# Patient Record
Sex: Female | Born: 1985 | Race: White | Hispanic: No | Marital: Married | Smoking: Never smoker
Health system: Southern US, Community
[De-identification: ages and names within clinical notes are randomized; demographics above are authoritative.]

## PROBLEM LIST (undated history)

## (undated) ENCOUNTER — Inpatient Hospital Stay (HOSPITAL_COMMUNITY): Payer: Self-pay

## (undated) ENCOUNTER — Emergency Department (HOSPITAL_COMMUNITY): Disposition: A | Discharge status: Home / Self Care | Payer: BC Managed Care – PPO

## (undated) DIAGNOSIS — F32A Depression, unspecified: Secondary | ICD-10-CM

## (undated) DIAGNOSIS — U071 COVID-19: Secondary | ICD-10-CM

## (undated) DIAGNOSIS — K219 Gastro-esophageal reflux disease without esophagitis: Secondary | ICD-10-CM

## (undated) DIAGNOSIS — J45909 Unspecified asthma, uncomplicated: Secondary | ICD-10-CM

## (undated) DIAGNOSIS — K5010 Crohn's disease of large intestine without complications: Secondary | ICD-10-CM

## (undated) DIAGNOSIS — Z5189 Encounter for other specified aftercare: Secondary | ICD-10-CM

## (undated) DIAGNOSIS — D649 Anemia, unspecified: Secondary | ICD-10-CM

## (undated) DIAGNOSIS — T7840XA Allergy, unspecified, initial encounter: Secondary | ICD-10-CM

## (undated) DIAGNOSIS — F419 Anxiety disorder, unspecified: Secondary | ICD-10-CM

## (undated) DIAGNOSIS — R51 Headache: Secondary | ICD-10-CM

## (undated) DIAGNOSIS — F329 Major depressive disorder, single episode, unspecified: Secondary | ICD-10-CM

## (undated) HISTORY — DX: Allergy, unspecified, initial encounter: T78.40XA

## (undated) HISTORY — PX: FRACTURE SURGERY: SHX138

## (undated) HISTORY — PX: SPINE SURGERY: SHX786

## (undated) HISTORY — DX: Encounter for other specified aftercare: Z51.89

## (undated) HISTORY — DX: Unspecified asthma, uncomplicated: J45.909

## (undated) HISTORY — DX: Anxiety disorder, unspecified: F41.9

## (undated) HISTORY — DX: Anemia, unspecified: D64.9

## (undated) HISTORY — PX: WISDOM TOOTH EXTRACTION: SHX21

## (undated) HISTORY — PX: LAPAROSCOPIC ENDOMETRIOSIS FULGURATION: SUR769

## (undated) HISTORY — PX: CERVICAL SPINE SURGERY: SHX589

---
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
## 2007-03-04 ENCOUNTER — Ambulatory Visit: Payer: Self-pay | Admitting: Gastroenterology

## 2007-03-29 ENCOUNTER — Ambulatory Visit: Payer: Self-pay | Admitting: Gastroenterology

## 2007-03-29 ENCOUNTER — Encounter: Payer: Self-pay | Admitting: Gastroenterology

## 2007-04-20 ENCOUNTER — Ambulatory Visit: Payer: Self-pay | Admitting: Gastroenterology

## 2008-02-02 ENCOUNTER — Ambulatory Visit (HOSPITAL_COMMUNITY): Admission: RE | Admit: 2008-02-02 | Discharge: 2008-02-02 | Payer: Self-pay | Admitting: Family Medicine

## 2008-02-02 ENCOUNTER — Encounter (INDEPENDENT_AMBULATORY_CARE_PROVIDER_SITE_OTHER): Payer: Self-pay | Admitting: *Deleted

## 2008-02-10 ENCOUNTER — Other Ambulatory Visit: Admission: RE | Admit: 2008-02-10 | Discharge: 2008-02-10 | Payer: Self-pay | Admitting: Obstetrics and Gynecology

## 2008-10-19 ENCOUNTER — Ambulatory Visit: Payer: Self-pay | Admitting: Internal Medicine

## 2008-10-19 ENCOUNTER — Telehealth: Payer: Self-pay | Admitting: Gastroenterology

## 2008-10-19 ENCOUNTER — Telehealth (INDEPENDENT_AMBULATORY_CARE_PROVIDER_SITE_OTHER): Payer: Self-pay | Admitting: *Deleted

## 2008-10-19 ENCOUNTER — Ambulatory Visit: Payer: Self-pay | Admitting: Physician Assistant

## 2008-10-19 LAB — CONVERTED CEMR LAB
Basophils Absolute: 0 10*3/uL (ref 0.0–0.1)
Basophils Relative: 0.9 % (ref 0.0–3.0)
Bilirubin Urine: NEGATIVE
Eosinophils Absolute: 0.2 10*3/uL (ref 0.0–0.7)
Eosinophils Relative: 3.7 % (ref 0.0–5.0)
HCT: 39.5 % (ref 36.0–46.0)
Hemoglobin: 13.5 g/dL (ref 12.0–15.0)
Ketones, ur: NEGATIVE mg/dL
Lymphocytes Relative: 25.3 % (ref 12.0–46.0)
Lymphs Abs: 1.3 10*3/uL (ref 0.7–4.0)
MCHC: 34.2 g/dL (ref 30.0–36.0)
MCV: 87 fL (ref 78.0–100.0)
Monocytes Absolute: 0.4 10*3/uL (ref 0.1–1.0)
Monocytes Relative: 6.7 % (ref 3.0–12.0)
Neutro Abs: 3.4 10*3/uL (ref 1.4–7.7)
Neutrophils Relative %: 63.4 % (ref 43.0–77.0)
Nitrite: NEGATIVE
Platelets: 276 10*3/uL (ref 150.0–400.0)
RBC: 4.54 M/uL (ref 3.87–5.11)
RDW: 13.1 % (ref 11.5–14.6)
Specific Gravity, Urine: 1.01 (ref 1.000–1.030)
Total Protein, Urine: NEGATIVE mg/dL
Urine Glucose: NEGATIVE mg/dL
Urobilinogen, UA: 0.2 (ref 0.0–1.0)
WBC: 5.3 10*3/uL (ref 4.5–10.5)
hCG, Beta Chain, Quant, S: <0.5 milliintl units/mL
pH: 7 (ref 5.0–8.0)

## 2008-10-20 ENCOUNTER — Encounter: Payer: Self-pay | Admitting: Physician Assistant

## 2009-05-22 ENCOUNTER — Telehealth: Payer: Self-pay | Admitting: Gastroenterology

## 2009-05-23 ENCOUNTER — Ambulatory Visit: Payer: Self-pay | Admitting: Gastroenterology

## 2009-05-23 ENCOUNTER — Encounter (INDEPENDENT_AMBULATORY_CARE_PROVIDER_SITE_OTHER): Payer: Self-pay | Admitting: *Deleted

## 2009-05-23 DIAGNOSIS — K219 Gastro-esophageal reflux disease without esophagitis: Secondary | ICD-10-CM | POA: Insufficient documentation

## 2009-05-24 ENCOUNTER — Ambulatory Visit: Payer: Self-pay | Admitting: Gastroenterology

## 2009-06-18 ENCOUNTER — Ambulatory Visit: Payer: Self-pay | Admitting: Gastroenterology

## 2009-06-18 ENCOUNTER — Telehealth: Payer: Self-pay | Admitting: Gastroenterology

## 2009-06-18 DIAGNOSIS — F419 Anxiety disorder, unspecified: Secondary | ICD-10-CM | POA: Insufficient documentation

## 2009-06-28 ENCOUNTER — Encounter: Payer: Self-pay | Admitting: Gastroenterology

## 2009-07-06 ENCOUNTER — Telehealth (INDEPENDENT_AMBULATORY_CARE_PROVIDER_SITE_OTHER): Payer: Self-pay | Admitting: *Deleted

## 2010-03-08 ENCOUNTER — Telehealth: Payer: Self-pay | Admitting: Gastroenterology

## 2010-05-19 HISTORY — PX: HAND SURGERY: SHX662

## 2010-06-03 ENCOUNTER — Ambulatory Visit
Admission: RE | Admit: 2010-06-03 | Discharge: 2010-06-03 | Payer: Self-pay | Source: Home / Self Care | Attending: Gastroenterology | Admitting: Gastroenterology

## 2010-06-03 ENCOUNTER — Other Ambulatory Visit: Payer: Self-pay | Admitting: Gastroenterology

## 2010-06-03 LAB — BASIC METABOLIC PANEL
BUN: 10 mg/dL (ref 6–23)
CO2: 30 mEq/L (ref 19–32)
Calcium: 9.4 mg/dL (ref 8.4–10.5)
Chloride: 101 mEq/L (ref 96–112)
Creatinine, Ser: 0.6 mg/dL (ref 0.4–1.2)
GFR: 123 mL/min (ref >60.00)
Glucose, Bld: 92 mg/dL (ref 70–99)
Potassium: 4 mEq/L (ref 3.5–5.1)
Sodium: 137 mEq/L (ref 135–145)

## 2010-06-03 LAB — HEPATIC FUNCTION PANEL
ALT: 26 U/L (ref 0–35)
AST: 23 U/L (ref 0–37)
Albumin: 4.2 g/dL (ref 3.5–5.2)
Alkaline Phosphatase: 33 U/L — ABNORMAL LOW (ref 39–117)
Bilirubin, Direct: 0.1 mg/dL (ref 0.0–0.3)
Total Bilirubin: 0.4 mg/dL (ref 0.3–1.2)
Total Protein: 7 g/dL (ref 6.0–8.3)

## 2010-06-03 LAB — CBC WITH DIFFERENTIAL/PLATELET
Basophils Absolute: 0.1 10*3/uL (ref 0.0–0.1)
Basophils Relative: 1.2 % (ref 0.0–3.0)
Eosinophils Absolute: 0.2 10*3/uL (ref 0.0–0.7)
Eosinophils Relative: 2.1 % (ref 0.0–5.0)
HCT: 38.9 % (ref 36.0–46.0)
Hemoglobin: 13.2 g/dL (ref 12.0–15.0)
Lymphocytes Relative: 18.2 % (ref 12.0–46.0)
Lymphs Abs: 1.7 10*3/uL (ref 0.7–4.0)
MCHC: 34 g/dL (ref 30.0–36.0)
MCV: 86.4 fl (ref 78.0–100.0)
Monocytes Absolute: 0.8 10*3/uL (ref 0.1–1.0)
Monocytes Relative: 8 % (ref 3.0–12.0)
Neutro Abs: 6.7 10*3/uL (ref 1.4–7.7)
Neutrophils Relative %: 70.5 % (ref 43.0–77.0)
Platelets: 345 10*3/uL (ref 150.0–400.0)
RBC: 4.5 Mil/uL (ref 3.87–5.11)
RDW: 13 % (ref 11.5–14.6)
WBC: 9.5 10*3/uL (ref 4.5–10.5)

## 2010-06-03 LAB — TSH: TSH: 0.65 u[IU]/mL (ref 0.35–5.50)

## 2010-06-03 LAB — HIGH SENSITIVITY CRP: CRP, High Sensitivity: 6.21 mg/L — ABNORMAL HIGH (ref 0.00–5.00)

## 2010-06-03 LAB — SEDIMENTATION RATE: Sed Rate: 10 mm/hr (ref 0–22)

## 2010-06-03 LAB — VITAMIN B12: Vitamin B-12: 289 pg/mL (ref 211–911)

## 2010-06-09 ENCOUNTER — Encounter: Payer: Self-pay | Admitting: Gastroenterology

## 2010-06-18 NOTE — Medication Information (Signed)
Summary: Dexilant Approved/[employer]  Dexilant Approved/[employer]   Imported By: Sherian Rein 07/05/2009 07:50:59  _____________________________________________________________________  External Attachment:    Type:   Image     Comment:   External Document

## 2010-06-18 NOTE — Progress Notes (Signed)
Summary: Triage   Phone Note Call from Patient Call back at Home Phone [phone]   Caller: Patient Call For: Dr. Russella Dar Reason for Call: Talk to Nurse Summary of Call: Pt. just left the office and her PW said to take Dexilant and Diphenhydramine. She does not know what the medication Diphenhydramine is for. Initial call taken by: Karna Christmas,  June 18, 2009 10:22 AM  Follow-up for Phone Call        Left message for patient to call back Darcey Nora RN, [facility]  June 18, 2009 10:35 AM  questions answered, Patient  didn't know diphenhydramine was Benadryl. Follow-up by: Darcey Nora RN, CGRN,  June 19, 2009 12:10 PM

## 2010-06-18 NOTE — Assessment & Plan Note (Signed)
Summary: dysphagia and chest pain/sheri   History of Present Illness Visit Type: follow up  Primary GI MD: Elie Goody MD [facility] Primary Provider: Tomi Bamberger, NP Requesting Provider: n/a Chief Complaint: Epigastric pain when pt swallows food and chest pain  History of Present Illness:   This is a 25 year old female with Crohn's ileocolitis diagnosed in 2008 who relates a 3-4 week history of acid reflux, regurgitation, pain and difficulty swallowing. She also notes epigastric pain. 2 weeks ago she had a hiccups for an entire day. She feels her Crohn's disease has been under good control with stools that are generally loose and rare episodes of blood. She has not had any abdominal pain or severe diarrhea.   GI Review of Systems    Reports abdominal pain and  chest pain.     Location of  Abdominal pain: epigastric area.    Denies acid reflux, belching, bloating, dysphagia with liquids, dysphagia with solids, heartburn, loss of appetite, nausea, vomiting, vomiting blood, weight loss, and  weight gain.        Denies anal fissure, black tarry stools, change in bowel habit, constipation, diarrhea, diverticulosis, fecal incontinence, heme positive stool, hemorrhoids, irritable bowel syndrome, jaundice, light color stool, liver problems, rectal bleeding, and  rectal pain.   Current Medications (verified): 1)  Pentasa 500 Mg Cr-Caps (Mesalamine) .... Take 2 Capsules Four Times A Day 2)  Birth Control .... As Directed 3)  Klonopin 0.5 Mg Tabs (Clonazepam) .... As Needed 4)  Fluoxetine Hcl 10 Mg Caps (Fluoxetine Hcl) .... Once Daily 5)  Tylenol Extra Strength 500 Mg Tabs (Acetaminophen) .... 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 Unremarkable  Family History: Family  History of Esophageal Cancer:Grandfather Family History of Heart Disease:  No FH of Colon Cancer:  Social History: Reviewed history from 10/19/2008 and no changes required. 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  Alcohol Use - yes Illicit Drug Use - no Patient gets regular exercise.  Review of Systems       The pertinent positives and negatives are noted as above and in the HPI. All other ROS were reviewed and were negative.   Vital Signs:  Patient profile:   25 year old female Height:      62 inches Weight:      141 pounds BMI:     25.88 BSA:     1.65 Pulse rate:   84 / minute Pulse rhythm:   regular BP sitting:   98 / 66  (left arm) Cuff size:   regular  Vitals Entered By: Ok Anis CMA (May 23, 2009 10:26 AM)  Physical Exam  General:  Well developed, well nourished, no acute distress. Head:  Normocephalic and atraumatic. Eyes:  PERRLA, no icterus. Ears:  Normal auditory acuity. Mouth:  No deformity or lesions, dentition normal. Neck:  Supple; no masses or thyromegaly. Lungs:  Clear throughout to auscultation. Heart:  Regular rate and rhythm; no murmurs, rubs,  or bruits. Abdomen:  Soft, nontender and nondistended. No masses, hepatosplenomegaly or hernias noted. Normal bowel sounds. Msk:  Symmetrical with no gross deformities. Normal posture. Pulses:  Normal pulses noted. Extremities:  No clubbing, cyanosis, edema or deformities noted. Neurologic:  Alert and  oriented x4;  grossly normal neurologically. Cervical Nodes:  No significant cervical  adenopathy. Inguinal Nodes:  No significant inguinal adenopathy. Psych:  Alert and cooperative. Normal mood and affect.   Impression & Recommendations:  Problem # 1:  OTHER DYSPHAGIA (ICD-787.29) Dysphagia and odynophagia. Rule out esophagitis, strictures, Crohn's and other disorders. The risks, benefits and alternatives to endoscopy with possible biopsy and possible dilation were discussed  with the patient and they consent to proceed. The procedure will be scheduled electively. Begin Prevacid 30 mg q. day, and standard antireflux measures. Orders: EGD SAV (EGD SAV)  Problem # 2:  GERD (ICD-530.81) As in problem #1. Orders: EGD SAV (EGD SAV)  Problem # 3:  CROHN'S DISEASE-LARGE & SMALL INTESTINE (ICD-555.2) Continue Pentasa 1 g q.i.d. She has not been compliant with recommended followup for Crohn's disease. Recommend followup office visit at least yearly if she is doing well.  Patient Instructions: 1)  Conscious Sedation brochure given.  2)  Upper Endoscopy with Dilatation brochure given.  3)  Start Prevacid 2 tablets by mouth every morning.  4)  Avoid foods high in acid content ( tomatoes, citrus juices, spicy foods) . Avoid eating within 3 to 4 hours of lying down or before exercising. Do not over eat; try smaller more frequent meals. Elevate head of bed four inches when sleeping.  5)  Copy sent to : Tomi Bamberger, NP 6)  The medication list was reviewed and reconciled.  All changed / newly prescribed medications were explained.  A complete medication list was provided to the patient / caregiver.

## 2010-06-18 NOTE — Letter (Signed)
Summary: EGD Instructions  [facility]  [address] [place], [place] [address]   Phone: [phone]  Fax: [phone]       Sonia [name]    08-Aug-1985    MRN: [id number]       Procedure Day /Date: Thursday January 6th, 2011     Arrival Time:  10:00am     Procedure Time: 11:00am     Location of Procedure:                    _ x _ [facility] (4th Floor)    PREPARATION FOR ENDOSCOPY   On 05/24/09  THE DAY OF THE PROCEDURE:  1.   No solid foods, milk or milk products are allowed after midnight the night before your procedure.  2.   Do not drink anything colored red or purple.  Avoid juices with pulp.  No orange juice.  3.  You may drink clear liquids until 9:00am , which is 2 hours before your procedure.                                                                                                CLEAR LIQUIDS INCLUDE: Water Jello Ice Popsicles Tea (sugar ok, no milk/cream) Powdered fruit flavored drinks Coffee (sugar ok, no milk/cream) Gatorade Juice: apple, white grape, white cranberry  Lemonade Clear bullion, consomm, broth Carbonated beverages (any kind) Strained chicken noodle soup Hard Candy   MEDICATION INSTRUCTIONS  Unless otherwise instructed, you should take regular prescription medications with a small sip of water as early as possible the morning of your procedure.           OTHER INSTRUCTIONS  You will need a responsible adult at least 25 years of age to accompany you and drive you home.   This person must remain in the waiting room during your procedure.  Wear loose fitting clothing that is easily removed.  Leave jewelry and other valuables at home.  However, you may wish to bring a book to read or an iPod/MP3 player to listen to music as you wait for your procedure to start.  Remove all body piercing jewelry and leave at home.  Total time from sign-in until discharge is approximately 2-3 hours.  You  should go home directly after your procedure and rest.  You can resume normal activities the day after your procedure.  The day of your procedure you should not:   Drive   Make legal decisions   Operate machinery   Drink alcohol   Return to work  You will receive specific instructions about eating, activities and medications before you leave.    The above instructions have been reviewed and explained to me by   Marchelle Folks.     I fully understand and can verbalize these instructions _____________________________ Date _________

## 2010-06-18 NOTE — Assessment & Plan Note (Signed)
Summary: FOLLOW UP--CH.   History of Present Illness Visit Type: Follow-up Visit Primary GI MD: Joylene Igo MD [facility] Primary Provider: Delia Chimes, NP Requesting Provider: n/a Chief Complaint: Patient here to f/u after EGD. She states that her dysphagia continues to remain the same. She denies any heartburn or other GI symptoms at this time. History of Present Illness:   Sonia [name] returns today complaining of brief epigastric pain with swallowing. She does not experience this with water, but does with other fluids and solids. Symptoms have not changed to use Prevacid. Upper endoscopy with empiric dilation was normal and the dilation does not improve her symptoms. She complains of frequent chest pain in her left lower chest associated with "heart fluttering". The symptoms occur intermittently throughout the day and they're not related to meals. She has frequent episodes of dyspnea at rest particularly at night.   GI Review of Systems    Reports chest pain, dysphagia with liquids, and  dysphagia with solids.      Denies abdominal pain, acid reflux, belching, bloating, heartburn, loss of appetite, nausea, vomiting, vomiting blood, weight loss, and  weight gain.        Denies anal fissure, black tarry stools, change in bowel habit, constipation, diarrhea, diverticulosis, fecal incontinence, heme positive stool, hemorrhoids, irritable bowel syndrome, jaundice, light color stool, liver problems, rectal bleeding, and  rectal pain.   Current Medications (verified): 1)  Pentasa 500 Mg Cr-Caps (Mesalamine) .... Take 2 Capsules By Mouth Once Daily 2)  Klonopin 0.5 Mg Tabs (Clonazepam) .... As Needed 3)  Fluoxetine Hcl 10 Mg Caps (Fluoxetine Hcl) .... Once Daily 4)  Tylenol Extra Strength 500 Mg Tabs (Acetaminophen) .... Take Every 6 Hours As Needed Pain 5)  Prevacid 24hr 15 Mg Cpdr (Lansoprazole) .... 2 Tablets By Mouth Every Morning 6)  Diphenhydramine Hcl 25 Mg Caps (Diphenhydramine  Hcl) .... 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 Unremarkable  Family History: Family History of Esophageal Cancer:Grandfather Family History of Heart Disease: Mother, Maternal Grandmother No FH of Colon Cancer: Family History of Clotting disorder: Mother ?  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  Alcohol Use - yes-occasional Illicit Drug Use - no Patient gets regular exercise.  Review of Systems       The patient complains of back pain, headaches-new, heart rhythm changes, and shortness of breath.         The pertinent positives and negatives are noted as above and in the HPI. All other ROS were reviewed and were negative.   Vital Signs:  Patient profile:   25 year old female Height:      62 inches Weight:      139 pounds BMI:     25.52 BSA:     1.64 Pulse rate:   64 / minute Pulse rhythm:   irregular BP sitting:   100 / 709  (left arm)  Vitals Entered By: Awilda Bill CMA Deborra Medina) (June 18, 2009 9:27 AM)  Physical Exam  General:  Well developed, well nourished, no acute distress. Head:  Normocephalic and atraumatic. Eyes:  PERRLA, no icterus. Mouth:  No deformity or lesions, dentition normal. Chest Wall:  Symmetrical;  no deformities or tenderness. Lungs:  Clear throughout to auscultation. Heart:  Regular rate and rhythm; no  murmurs, rubs,  or bruits. Abdomen:  Soft, nontender and nondistended. No masses, hepatosplenomegaly or hernias noted. Normal bowel sounds. Psych:  Alert and cooperative. Normal mood and affect.  Impression & Recommendations:  Problem # 1:  OTHER DYSPHAGIA (ICD-787.29) Brief epigastric pain, associated with swallowing. Rule out esophageal motility disturbances were  refractory reflux symptoms, although the symptoms are certainly atypical for reflux. Discontinue Prevacid and begin Dexilant 60 mg q.a.m. If her symptoms do not respond proceed with esophageal manometry.  Problem # 2:  CROHN'S DISEASE-LARGE & SMALL INTESTINE (ICD-555.2) Disease stable. Continue Pentasa.  Problem # 3:  CHEST PAIN (ICD-786.50) This does not appear to be a gastrointestinal process. Further followup with her PCP.  Problem # 4:  ANXIETY (ICD-300.00)  Further followup with her PCP.  Patient Instructions: 1)  Discontinue Prevacid and start Dexilant one tablet by mouth once daily.  2)  Please schedule a follow-up appointment in 2-3 months. 3)  Copy sent to : Delia Chimes, NP 4)  The medication list was reviewed and reconciled.  All changed / newly prescribed medications were explained.  A complete medication list was provided to the patient / caregiver.  Prescriptions: DEXILANT 60 MG CPDR (DEXLANSOPRAZOLE) one tablet by mouth once daily  #30 x 11   Entered by:   Marlon Pel CMA ([place])   Authorized by:   Ladene Artist MD [facility]   Signed by:   Ladene Artist MD FACG on 06/18/2009   Method used:   Electronically to        [id number] (retail)       [address]       [place], [place]  [address]       Ph: [phone]       Fax: [phone]   RxID:   [id number]

## 2010-06-18 NOTE — Progress Notes (Signed)
Summary: med ?'s   Phone Note Call from Patient Call back at [facility] Phone [phone]   Caller: Patient Call For: Dr. Russella Dar Reason for Call: Talk to Nurse Summary of Call: questions about Pentasa Initial call taken by: Vallarie Mare,  March 08, 2010 2:25 PM  Follow-up for Phone Call        patient is going to starting trying to get pregnant and would like to know can she continue her Pentasa, if not what can she take. Also wants to know if she needs an office visit if her meds change. I advised her that she was told to make an ov in 2-3 months at her last ov in January but she said she thought that was to follow up on her reflux and it is much better.  Follow-up by: Harlow Mares CMA Duncan Dull),  March 08, 2010 2:39 PM  Additional Follow-up for Phone Call Additional follow up Details #1::        Remain on Pentasa at the same dose for pregnancy. We usually do not modify a stable regimen of treatment for Crohns to keep the chance of a Crohns flare during pegnancy to a minimum. Additional Follow-up by: [name] MD FACG,  March 10, 2010 7:38 PM    Additional Follow-up for Phone Call Additional follow up Details #2::    Informed pt to continue same regimen throughout pregnancy and pt verbalized understanding. Pt is due for a f/u office visit and pt scheduled for 04/17/10 at 9:45am. Follow-up by: Christie Nottingham CMA Duncan Dull),  March 11, 2010 9:05 AM

## 2010-06-18 NOTE — Progress Notes (Signed)
Summary: Pain when swallowing  Phone Note Call from Patient Call back at Home Phone [phone]   Reason for Call: Talk to Nurse Summary of Call: Pt c/o of pain under her sternum, worse when she swallows.  Pt would like to speak with nurse before scheduling appt. Initial call taken by: Francee Piccolo CMA Duncan Dull),  May 22, 2009 9:26 AM  Follow-up for Phone Call        Patient  wants to be seen today, I advised her Dr Russella Dar is not in and  she can be seen tomorrow.  patient will come in 05-23-09 10:15 Follow-up by: Darcey Nora RN, CGRN,  May 22, 2009 9:53 AM

## 2010-06-18 NOTE — Procedures (Signed)
Summary: Upper Endoscopy w/DIL  Patient: Sonia [name] Note: All result statuses are Final unless otherwise noted.  Tests: (1) Upper Endoscopy w/DIL (UED)  UED Upper Endoscopy w/DIL                             DONE     [facility]     [address]     [place], [place]  [address]           ENDOSCOPY PROCEDURE REPORT           PATIENT:  [name], [name]  MR#:  [id number]     BIRTHDATE:  [birth year], 23 yrs. old  GENDER:  female           ENDOSCOPIST:  Judie Petit T. Russella Dar, MD, [facility]           PROCEDURE DATE:  05/24/2009     PROCEDURE:  EGD with dilatation over guidewire     ASA CLASS:  Class I     INDICATIONS:  1) dysphagia  2) GERD           MEDICATIONS:  Benadryl 50 mg IV, Fentanyl 100 mcg IV, Versed 10 mg     IV     TOPICAL ANESTHETIC:  Exactacain Spray           DESCRIPTION OF PROCEDURE:   After the risks benefits and     alternatives of the procedure were thoroughly explained, informed     consent was obtained.  The [id number] endoscope was     introduced through the mouth and advanced to the second portion of     the duodenum, without limitations.  The instrument was slowly     withdrawn as the mucosa was carefully examined.     <<PROCEDUREIMAGES>>           The upper, middle, and distal third of the esophagus were     carefully inspected and no abnormalities were noted. The z-line     was well seen at the GEJ. The endoscope was advanced into the     fundus and cardia which were normal including a retroflexed view.     The pylorus, antrum, gastric body , first and second part of the     duodenum were unremarkable.  Dilation was then performed at the     total esophagus for dysphagia without a stricture noted:           1) Dilator:  Savary over guidewire  Size:  17     Resistance:  none  Heme:  none           COMPLICATIONS:  None           ENDOSCOPIC IMPRESSION:     1) Normal EGD           RECOMMENDATIONS:     1) anti-reflux regimen     2)  continue PPI, Prevacid 24HR, 2 po qam for 4 weeks the change     to 1 po qam     3) call office to schedule an office visit for 6 weeks     4) post dilation instructions           Remo Kirschenmann T. Russella Dar, MD, [facility]           CC:  Tomi Bamberger, NP           n.     Rosalie DoctorVenita Lick. Russella Dar  at 05/24/2009 11:23 AM           Lara, Akylah, [place] [phone]  Note: An exclamation mark (!) indicates a result that was not dispersed into the flowsheet. Document Creation Date: 05/24/2009 1:00 PM _______________________________________________________________________  (1) Order result status: Final Collection or observation date-time: 05/24/2009 11:18 Requested date-time:  Receipt date-time:  Reported date-time:  Referring Physician:   Ordering Physician: Claudette Head [phone]) Specimen Source:  Source: Launa Grill Order Number: [id number] Lab site:

## 2010-06-18 NOTE — Progress Notes (Signed)
Summary: Records request  Records request received from patient via the fax machine. Forwarded to [employer] for processing.

## 2010-06-20 NOTE — Assessment & Plan Note (Signed)
Summary: Crohns f/u, discuss meds/all   History of Present Illness Visit Type: Follow-up Visit Primary GI MD: Elie Goody MD [facility] Primary Provider: Tomi Bamberger, NP Requesting Provider: n/a Chief Complaint: follow-up Crohn's pt. denies any problems at this time.  She is wanting to get pregnant and needs to discuss medications and risks. History of Present Illness:   This patient returns today with her husband for followup of Crohn's disease and GERD. She states she has occasional reflux symptoms, mainly related to tomato based products and [place] foods. She takes Prevacid 15 mg p.r.n. with good control of her symptoms.  She notes occasional lower abdominal discomfort, particularly after eating greasy foods. She denies diarrhea, constipation, and rectal bleeding. She has been trying to get pregnant for the past several months and would like some advise regarding Pentasa during pregnancy.   GI Review of Systems      Denies abdominal pain, acid reflux, belching, bloating, chest pain, dysphagia with liquids, dysphagia with solids, heartburn, loss of appetite, nausea, vomiting, vomiting blood, weight loss, and  weight gain.        Denies anal fissure, black tarry stools, change in bowel habit, constipation, diarrhea, diverticulosis, fecal incontinence, heme positive stool, hemorrhoids, irritable bowel syndrome, jaundice, light color stool, liver problems, rectal bleeding, and  rectal pain.   Current Medications (verified): 1)  Pentasa 500 Mg Cr-Caps (Mesalamine) .... Take 2 Capsules By Mouth Once Daily 2)  Tylenol Extra Strength 500 Mg Tabs (Acetaminophen) .... Take Every 6 Hours As Needed Pain 3)  Diphenhydramine Hcl 25 Mg Caps (Diphenhydramine Hcl) .... Take 1 Capsule By Mouth As Needed 4)  Cefadroxil 500 Mg Caps (Cefadroxil) .Marland Kitchen.. 1 By Mouth Two Times A Day X 10 Days 5)  Prevacid 15 Mg Cpdr (Lansoprazole) .... One Tablet By Mouth Once Daily  Allergies (verified): No Known Drug  Allergies  Past History:  Past Medical History: Reviewed history from 06/18/2009 and no changes required. CROHN'S DISEASE, LARGE AND SMALL INTESTINES (ICD-555.2) ANXIETY  Past Surgical History: Reviewed history from 05/19/2007 and no changes required. Unremarkable  Family History: Reviewed history from 06/18/2009 and no changes required. Family History of Esophageal Cancer:Grandfather Family History of Heart Disease: Mother, Maternal Grandmother No FH of Colon Cancer: Family History of Clotting disorder: Mother ?  Social History: Reviewed history from 06/18/2009 and no changes required. 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  Alcohol Use - yes-occasional Illicit Drug Use - no Patient gets regular exercise.  Review of Systems       The pertinent positives and negatives are noted as above and in the HPI. All other ROS were reviewed and were negative.   Vital Signs:  Patient profile:   25 year old female Height:      62 inches Weight:      151 pounds BMI:     27.72 Pulse rate:   84 / minute Pulse rhythm:   regular BP sitting:   98 / 74  (left arm)  Vitals Entered By: Milford Cage NCMA (June 03, 2010 3:58 PM)  Physical Exam  General:  Well developed, well nourished, no acute distress. Head:  Normocephalic and atraumatic. Eyes:  PERRLA, no icterus. Mouth:  No deformity or lesions, dentition normal. Lungs:  Clear throughout to auscultation. Heart:  Regular rate and rhythm; no murmurs, rubs,  or bruits. Abdomen:  Soft, nontender and nondistended. No masses, hepatosplenomegaly or hernias noted. Normal bowel sounds. Psych:  Alert and cooperative. Normal mood and affect.  Impression &  Recommendations:  Problem # 1:  CROHN'S DISEASE-LARGE & SMALL INTESTINE (ICD-555.2) Her disease appears to be stable. I suspect the majority of her symptoms are related to GERD and fatty food intolerance. Obtain blood work as below. I encouraged her to remain  on a 5-ASA agent for now and during pregnancy at a standard therapeutic dose to maintain control of her Crohn's disease. The 5-ASA agents are very low risk to the fetus and the standard recommendations are to continue these medications during pregnancy in IBD patients. I furthermore advised her to discuss this with her obstetrician. Her prior obstetrician is retired and she is now seeking a new one. Orders: TLB-BMP (Basic Metabolic Panel-BMET) (80048-METABOL) TLB-Hepatic/Liver Function Pnl (80076-HEPATIC) TLB-TSH (Thyroid Stimulating Hormone) (84443-TSH) TLB-CBC Platelet - w/Differential (85025-CBCD) TLB-B12, Serum-Total ONLY (16109-U04) TLB-CRP-High Sensitivity (C-Reactive Protein) (86140-FCRP) TLB-Sedimentation Rate (ESR) (85652-ESR)  Problem # 2:  GERD (ICD-530.81) Intermittent symptoms. Standard dietary and lifestyle measures supplied the patient. Prevacid 15 mg q. day p.r.n. I also advised Prevacid would be safe to use the pregnancy as well, and she should further discuss this with her obstetrician, as well.  Patient Instructions: 1)  Please go directly to the basement to have your labs drawn.  2)  Pentasa prescription has been sent to your pharmacy.  3)  Please schedule a follow-up appointment in 1 year. 4)  Copy sent to :  Tomi Bamberger, NP 5)  The medication list was reviewed and reconciled.  All changed / newly prescribed medications were explained.  A complete medication list was provided to the patient / caregiver.  Prescriptions: PENTASA 500 MG CR-CAPS (MESALAMINE) 2 capsules by mouth three times a day  #180 x 11   Entered by:   Christie Nottingham CMA (AAMA)   Authorized by:   [name] MD [facility]   Signed by:   Christie Nottingham CMA (AAMA) on 06/03/2010   Method used:   Electronically to        [facility] [address] [id number] (retail)       [address]       [place], [place]  [address]       Ph: [phone]       Fax: [phone]   RxID:    [id number]

## 2010-10-01 NOTE — Assessment & Plan Note (Signed)
[facility]                         GASTROENTEROLOGY OFFICE NOTE   Sonia [name], Sonia [name]                          MRN:          [id number]  DATE:03/04/2007                            DOB:          Jul 03, 1985    REFERRING PHYSICIAN:  Robyn Haber, M.D.   REASON FOR CONSULTATION:  Diarrhea and lower abdominal pain.   HISTORY OF PRESENT ILLNESS:  Sonia [name] is a 25 year old white female  who relates the onset of diarrhea associated with crampy lower abdominal  pain and intermittent nausea beginning about 10 weeks ago.  Initially,  she had about 7 to 8 bowel movements a day.  Her diarrhea then became  intermittent and now is less frequent, and is occurring about 3 times in  the morning.  She notes no bleeding, weight loss, vomiting, fevers, or  chills.  She has had crampy lower abdominal pain and intermittent  nausea, and these symptoms have improved as her diarrhea has improved.  She was evaluated by Dr. Joseph Art at [facility].  Stool studies were entirely negative.  A comprehensive metabolic panel,  CBC, erythrocyte sedimentation rate, and urinalysis were also all  unremarkable.  She was treated with 5 days of Cipro with no improvement  in symptoms and 4 days of Flagyl with no improvement in symptoms.  She  notes her diarrhea may have increased while on the antibiotics, and they  were discontinued.  She notes occasional pains in the joints of her  lower extremities.  She has no rashes.  She denies any antibiotic usage,  travel history, or change in diet prior to the onset of her symptoms.   FAMILY HISTORY:  Negative for ulcerative colitis, Crohn's disease, colon  cancer, or colon polyps.   PAST MEDICAL HISTORY:  Negative.   PAST SURGICAL HISTORY:  Negative.   CURRENT MEDICATIONS:  Imodium AD p.r.n.   MEDICATION ALLERGIES:  None known.   SOCIAL HISTORY AND REVIEW OF SYSTEMS:  Per the hand-written form.   PHYSICAL EXAM:   Well-developed, well-nourished white female in no acute  distress.  Height 5 feet 2 inches, weight 135 pounds, blood pressure 92/62, pulse  62 and regular.  HEENT:  Anicteric sclerae, oropharynx clear.  CHEST:  Clear to auscultation bilaterally.  CARDIAC:  Regular rate and rhythm without murmurs appreciated.  ABDOMEN:  Soft with mild suprapubic tenderness to deep palpation.  No  rebound or guarding.  No palpable organomegaly, masses, or hernias.  Normoactive bowel sounds.  RECTAL:  Deferred to time of colonoscopy.  EXTREMITIES:  Without cyanosis, clubbing, or edema.  NEUROLOGIC:  Alert and oriented x3.  Grossly nonfocal.   ASSESSMENT AND PLAN:  Diarrhea with suprapubic pain.  Rule out  inflammatory bowel disease, irritable bowel syndrome, or a post-  infectious diarrhea that is slowly resolving.  She is to maintain a low-  fat, lactose-free diet.  Risks, benefits, and alternatives to  colonoscopy with possible biopsy and possible polypectomy discussed with  the patient.  She consents to proceed.  This will be scheduled  electively.     Pricilla Riffle. Fuller Plan, MD,  [facility]  Electronically Signed    MTS/MedQ  DD: 03/04/2007  DT: 03/05/2007  Job #: [id number]   cc:   Robyn Haber, M.D.

## 2010-10-01 NOTE — Assessment & Plan Note (Signed)
[place] OFFICE NOTE   Sonia [name], Sonia [name]                          MRN:          [id number]  DATE:04/20/2007                            DOB:          02-Aug-1985    This is a return office visit for Crohn's enterocolitis.  Colonoscopy  revealed erosions and aphthous ulcerations from the terminal ileum to  the sigmoid colon.  Biopsies in the cecum, ascending colon, and  transverse colon had active inflammation typically seen in Crohn's.  The  left colon biopsy showed similar findings, as well as a small non-  necrotizing granuloma.  She was started on Entocort and Pentasa and has  had an excellent symptomatic response.  She had no pain or diarrhea for  several weeks.  She has had some mild lower abdominal pain since  Thanksgiving.  These symptoms are minimal compared to her previous  symptoms and they appear to have been exacerbated by over-eating at  Thanksgiving.  She notes no fevers, chills, joint symptoms, or weight  loss.  She is planning to go to [employer] for several days.   CURRENT MEDICATIONS:  1. Pentasa 1 g q.i.d.  2. Entocort 9 mg daily.  3. Birth control pills daily.  4. Excedrin p.r.n.   MEDICATION ALLERGIES:  None known.   EXAM:  In no acute distress.  Weight 137.4 pounds, blood pressure 96/60, pulse 60 and regular.  CHEST:  Clear to auscultation bilaterally.  CARDIAC:  Regular rate and rhythm without murmurs.  ABDOMEN:  Soft with very minimal lower abdominal tenderness to deep  palpation.  No rebound or guarding.  No palpable organomegaly, masses,  or hernias.  Normoactive bowel sounds.   ASSESSMENT AND PLAN:  Crohn's ileocolitis.  Continue Entocort for 8  weeks and then discontinue.  She would like to avoid the long-term use  of corticosteroids if at all possible.  Maintain Pentasa 1 g q.i.d.  Schedule a CT enterography to further evaluate small bowel Crohn's  disease.  Pending  findings, will consider a capsule endoscopy as well.  The patient requested a January appointment for the CT enterography.  Return office visit in 3 months.     Pricilla Riffle. Fuller Plan, MD, [facility]  Electronically Signed    MTS/MedQ  DD: 04/20/2007  DT: 04/20/2007  Job #: [id number]   cc:   Robyn Haber, M.D.

## 2011-03-02 ENCOUNTER — Emergency Department (HOSPITAL_COMMUNITY): Payer: BC Managed Care – PPO

## 2011-03-02 ENCOUNTER — Emergency Department (HOSPITAL_COMMUNITY)
Admission: EM | Admit: 2011-03-02 | Discharge: 2011-03-02 | Disposition: A | Discharge status: Home / Self Care | Payer: BC Managed Care – PPO | Attending: Emergency Medicine | Admitting: Emergency Medicine

## 2011-03-02 DIAGNOSIS — S62309A Unspecified fracture of unspecified metacarpal bone, initial encounter for closed fracture: Secondary | ICD-10-CM | POA: Insufficient documentation

## 2011-03-02 DIAGNOSIS — IMO0002 Reserved for concepts with insufficient information to code with codable children: Secondary | ICD-10-CM | POA: Insufficient documentation

## 2011-03-02 DIAGNOSIS — K5090 Crohn's disease, unspecified, without complications: Secondary | ICD-10-CM | POA: Insufficient documentation

## 2011-03-02 DIAGNOSIS — M79609 Pain in unspecified limb: Secondary | ICD-10-CM | POA: Insufficient documentation

## 2011-03-04 ENCOUNTER — Ambulatory Visit (HOSPITAL_BASED_OUTPATIENT_CLINIC_OR_DEPARTMENT_OTHER)
Admission: RE | Admit: 2011-03-04 | Discharge: 2011-03-04 | Disposition: A | Discharge status: Home / Self Care | Payer: BC Managed Care – PPO | Source: Ambulatory Visit | Attending: Orthopedic Surgery | Admitting: Orthopedic Surgery

## 2011-03-04 DIAGNOSIS — S62329A Displaced fracture of shaft of unspecified metacarpal bone, initial encounter for closed fracture: Secondary | ICD-10-CM | POA: Insufficient documentation

## 2011-03-04 DIAGNOSIS — IMO0002 Reserved for concepts with insufficient information to code with codable children: Secondary | ICD-10-CM | POA: Insufficient documentation

## 2011-03-04 DIAGNOSIS — Y998 Other external cause status: Secondary | ICD-10-CM | POA: Insufficient documentation

## 2011-03-04 DIAGNOSIS — Z01812 Encounter for preprocedural laboratory examination: Secondary | ICD-10-CM | POA: Insufficient documentation

## 2011-03-04 LAB — POCT HEMOGLOBIN-HEMACUE: Hemoglobin: 13 g/dL (ref 12.0–15.0)
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
## 2012-01-21 ENCOUNTER — Encounter (HOSPITAL_COMMUNITY): Payer: Self-pay | Admitting: Pharmacist

## 2012-01-26 ENCOUNTER — Inpatient Hospital Stay (HOSPITAL_COMMUNITY): Admission: RE | Admit: 2012-01-26 | Payer: BC Managed Care – PPO | Source: Ambulatory Visit

## 2012-01-26 ENCOUNTER — Encounter (HOSPITAL_COMMUNITY)
Admission: RE | Admit: 2012-01-26 | Discharge: 2012-01-26 | Disposition: A | Discharge status: Home / Self Care | Payer: BC Managed Care – PPO | Source: Ambulatory Visit | Attending: Obstetrics and Gynecology | Admitting: Obstetrics and Gynecology

## 2012-01-26 ENCOUNTER — Encounter (HOSPITAL_COMMUNITY): Payer: Self-pay

## 2012-01-26 HISTORY — DX: Crohn's disease of large intestine without complications: K50.10

## 2012-01-26 HISTORY — DX: Gastro-esophageal reflux disease without esophagitis: K21.9

## 2012-01-26 LAB — CBC
HCT: 36.4 % (ref 36.0–46.0)
Hemoglobin: 11.4 g/dL — ABNORMAL LOW (ref 12.0–15.0)
MCH: 26.3 pg (ref 26.0–34.0)
MCHC: 31.3 g/dL (ref 30.0–36.0)
MCV: 83.9 fL (ref 78.0–100.0)
Platelets: 356 10*3/uL (ref 150–400)
RBC: 4.34 MIL/uL (ref 3.87–5.11)
RDW: 13.4 % (ref 11.5–15.5)
WBC: 8.5 10*3/uL (ref 4.0–10.5)

## 2012-01-26 LAB — SURGICAL PCR SCREEN
MRSA, PCR: NEGATIVE
Staphylococcus aureus: NEGATIVE

## 2012-01-26 NOTE — Patient Instructions (Addendum)
Your procedure is scheduled on:02/04/12  Enter through the Main Entrance at :7am Pick up desk phone and dial [phone] and inform us of your arrival.  Please call [phone] if you have any problems the morning of surgery.  Remember: Do not eat after midnight- Tuesday Do not drink after: water ok until 0430 am Wed  Take these meds the morning of surgery with a sip of water: none  DO NOT wear jewelry, eye make-up, lipstick,body lotion, or dark fingernail polish. Do not shave for 48 hours prior to surgery.  If you are to be admitted after surgery, leave suitcase in car until your room has been assigned. Patients discharged on the day of surgery will not be allowed to drive home.   Remember to use your Hibiclens as instructed.

## 2012-01-27 ENCOUNTER — Other Ambulatory Visit: Payer: Self-pay | Admitting: Obstetrics and Gynecology

## 2012-02-03 NOTE — H&P (Signed)
Sonia [name], Sonia [name]            ACCOUNT NO.:  [id number]  MEDICAL RECORD NO.:  [id number]  LOCATION:  [facility]                         FACILITY:  [facility]  PHYSICIAN:  Lovenia Kim, M.D.DATE OF BIRTH:  04/19/1986  DATE OF ADMISSION:  12/29/2011 DATE OF DISCHARGE:                             HISTORY & PHYSICAL   CHIEF COMPLAINT:  Pelvic pain, dysmenorrhea, bilateral ovarian cysts.  HISTORY OF PRESENT ILLNESS:  She is a 26 year old white female, G0, P0 with a history of pelvic pain, dysmenorrhea, who presents now with an ultrasound consistent with bilateral endometrioma with an elevated CA125 (144) for surgical therapy.  MEDICATIONS:  Zyrtec as needed, Benadryl as needed.  ALLERGIES:  She has no known drug allergies.  FAMILY HISTORY:  Heart disease, MI, hypertension, breast cancer.  SOCIAL HISTORY:  She is a nonsmoker, nondrinker.  She denies domestic or physical violence.  No previous pregnancies.  PHYSICAL EXAMINATION:  GENERAL:  She is a well-developed, well-nourished white female, height of 62 inches, weight of 118 pounds. NECK:  Supple.  Full range of motion. LUNGS:  Clear. HEART:  Regular rhythm. ABDOMEN:  Soft, tender to palpation in bilateral lower quadrants. PELVIC:  NEFG, uterus NSSC, Bilateral adnexal fullness.  Ultrasound confirms a 6- cm left hemorrhagic ovarian mass and one 2 cm, one 3cm right ovarian Mass. All masses c/w endometriomas. EXTREMITIES:  There are no cords. NEUROLOGIC:  Nonfocal. SKIN:  Grossly intact.  __________  IMPRESSION:  Pelvic pain, dysmenorrhea, bilateral ovarian cysts consistent with endometriomas. Proceed with da Vinci-assisted diagnostic laparoscopy, bilateral ovarian cystectomies with possible ablation/excision of endometriosis, and chromopertubation. Risks of anesthesia, infection, bleeding, intraoperative complications to include bowel and bladder injury with need for repair noted.  The patient acknowledges and wishes to proceed.  Consent signed.     Lovenia Kim, M.D.     RJT/MEDQ  D:  02/03/2012  T:  02/03/2012  Job:  [id number]

## 2012-02-04 ENCOUNTER — Encounter (HOSPITAL_COMMUNITY)
Admission: RE | Disposition: A | Discharge status: Home / Self Care | Payer: Self-pay | Source: Ambulatory Visit | Attending: Obstetrics and Gynecology

## 2012-02-04 ENCOUNTER — Ambulatory Visit (HOSPITAL_COMMUNITY)
Admission: RE | Admit: 2012-02-04 | Discharge: 2012-02-04 | Disposition: A | Discharge status: Home / Self Care | Payer: BC Managed Care – PPO | Source: Ambulatory Visit | Attending: Obstetrics and Gynecology | Admitting: Obstetrics and Gynecology

## 2012-02-04 ENCOUNTER — Encounter (HOSPITAL_COMMUNITY): Payer: Self-pay | Admitting: Anesthesiology

## 2012-02-04 ENCOUNTER — Ambulatory Visit (HOSPITAL_COMMUNITY): Payer: BC Managed Care – PPO | Admitting: Anesthesiology

## 2012-02-04 DIAGNOSIS — N949 Unspecified condition associated with female genital organs and menstrual cycle: Secondary | ICD-10-CM | POA: Insufficient documentation

## 2012-02-04 DIAGNOSIS — N83209 Unspecified ovarian cyst, unspecified side: Secondary | ICD-10-CM | POA: Insufficient documentation

## 2012-02-04 DIAGNOSIS — N803 Endometriosis of pelvic peritoneum, unspecified: Secondary | ICD-10-CM | POA: Insufficient documentation

## 2012-02-04 DIAGNOSIS — N946 Dysmenorrhea, unspecified: Secondary | ICD-10-CM | POA: Insufficient documentation

## 2012-02-04 DIAGNOSIS — R109 Unspecified abdominal pain: Secondary | ICD-10-CM

## 2012-02-04 LAB — HCG, SERUM, QUALITATIVE: Preg, Serum: NEGATIVE

## 2012-02-04 SURGERY — ROBOTIC ASSISTED LAPAROSCOPIC OVARIAN CYSTECTOMY
Anesthesia: General | Site: Abdomen

## 2012-02-04 MED ORDER — MIDAZOLAM HCL 5 MG/5ML IJ SOLN
INTRAMUSCULAR | Status: DC | PRN
  Administered 2012-02-04: 2 mg via INTRAVENOUS

## 2012-02-04 MED ORDER — DEXAMETHASONE SODIUM PHOSPHATE 10 MG/ML IJ SOLN
INTRAMUSCULAR | Status: DC | PRN
  Administered 2012-02-04: 10 mg via INTRAVENOUS

## 2012-02-04 MED ORDER — CEFAZOLIN SODIUM-DEXTROSE 2-3 GM-% IV SOLR
INTRAVENOUS | Status: AC
  Filled 2012-02-04: qty 50

## 2012-02-04 MED ORDER — HYDROMORPHONE HCL PF 1 MG/ML IJ SOLN: 0.2500 mg | INTRAMUSCULAR | Status: DC | PRN

## 2012-02-04 MED ORDER — ROCURONIUM BROMIDE 50 MG/5ML IV SOLN
INTRAVENOUS | Status: AC
Start: 2012-02-04 — End: 2012-02-04
  Filled 2012-02-04: qty 1

## 2012-02-04 MED ORDER — LACTATED RINGERS IV SOLN
INTRAVENOUS | Status: DC
  Administered 2012-02-04 (×3): via INTRAVENOUS

## 2012-02-04 MED ORDER — KETOROLAC TROMETHAMINE 30 MG/ML IJ SOLN
INTRAMUSCULAR | Status: DC | PRN
  Administered 2012-02-04: 30 mg via INTRAVENOUS

## 2012-02-04 MED ORDER — GLYCOPYRROLATE 0.2 MG/ML IJ SOLN
INTRAMUSCULAR | Status: DC | PRN
  Administered 2012-02-04: 0.1 mg via INTRAVENOUS
  Administered 2012-02-04: 0.6 mg via INTRAVENOUS
  Administered 2012-02-04: 0.1 mg via INTRAVENOUS

## 2012-02-04 MED ORDER — LIDOCAINE HCL (CARDIAC) 20 MG/ML IV SOLN
INTRAVENOUS | Status: DC | PRN
  Administered 2012-02-04: 40 mg via INTRAVENOUS

## 2012-02-04 MED ORDER — METHYLENE BLUE 1 % INJ SOLN
INTRAMUSCULAR | Status: AC
  Filled 2012-02-04: qty 1

## 2012-02-04 MED ORDER — PROPOFOL 10 MG/ML IV BOLUS
INTRAVENOUS | Status: DC | PRN
  Administered 2012-02-04: 130 mg via INTRAVENOUS
  Administered 2012-02-04: 20 mg via INTRAVENOUS

## 2012-02-04 MED ORDER — OXYCODONE-ACETAMINOPHEN 5-325 MG PO TABS: 1.0000 | ORAL_TABLET | Freq: Once | ORAL | Status: DC

## 2012-02-04 MED ORDER — FENTANYL CITRATE 0.05 MG/ML IJ SOLN
INTRAMUSCULAR | Status: AC
  Administered 2012-02-04: 50 ug via INTRAVENOUS
  Filled 2012-02-04: qty 2

## 2012-02-04 MED ORDER — ROCURONIUM BROMIDE 50 MG/5ML IV SOLN
INTRAVENOUS | Status: AC
  Filled 2012-02-04: qty 1

## 2012-02-04 MED ORDER — CEFAZOLIN SODIUM-DEXTROSE 2-3 GM-% IV SOLR
2.0000 g | INTRAVENOUS | Status: AC
  Administered 2012-02-04: 2 g via INTRAVENOUS

## 2012-02-04 MED ORDER — LIDOCAINE HCL (CARDIAC) 20 MG/ML IV SOLN
INTRAVENOUS | Status: AC
  Filled 2012-02-04: qty 5

## 2012-02-04 MED ORDER — LACTATED RINGERS IR SOLN
Status: DC | PRN
  Administered 2012-02-04: 3000 mL

## 2012-02-04 MED ORDER — FENTANYL CITRATE 0.05 MG/ML IJ SOLN
25.0000 ug | INTRAMUSCULAR | Status: DC | PRN
  Administered 2012-02-04: 25 ug via INTRAVENOUS
  Administered 2012-02-04: 50 ug via INTRAVENOUS

## 2012-02-04 MED ORDER — ARTIFICIAL TEARS OP OINT
TOPICAL_OINTMENT | OPHTHALMIC | Status: DC | PRN
  Administered 2012-02-04: 1 via OPHTHALMIC

## 2012-02-04 MED ORDER — BUPIVACAINE HCL (PF) 0.25 % IJ SOLN
INTRAMUSCULAR | Status: AC
  Filled 2012-02-04: qty 30

## 2012-02-04 MED ORDER — OXYCODONE-ACETAMINOPHEN 5-325 MG PO TABS: 1.0000 | ORAL_TABLET | ORAL | Status: DC | PRN

## 2012-02-04 MED ORDER — SUFENTANIL CITRATE 50 MCG/ML IV SOLN
INTRAVENOUS | Status: AC
  Filled 2012-02-04: qty 1

## 2012-02-04 MED ORDER — KETOROLAC TROMETHAMINE 30 MG/ML IJ SOLN
INTRAMUSCULAR | Status: AC
  Filled 2012-02-04: qty 1

## 2012-02-04 MED ORDER — ROCURONIUM BROMIDE 100 MG/10ML IV SOLN
INTRAVENOUS | Status: DC | PRN
  Administered 2012-02-04: 5 mg via INTRAVENOUS
  Administered 2012-02-04: 50 mg via INTRAVENOUS
  Administered 2012-02-04: 10 mg via INTRAVENOUS

## 2012-02-04 MED ORDER — EPHEDRINE 5 MG/ML INJ
INTRAVENOUS | Status: AC
  Filled 2012-02-04: qty 10

## 2012-02-04 MED ORDER — OXYCODONE-ACETAMINOPHEN 5-325 MG PO TABS
ORAL_TABLET | ORAL | Status: AC
  Filled 2012-02-04: qty 1

## 2012-02-04 MED ORDER — GLYCOPYRROLATE 0.2 MG/ML IJ SOLN
INTRAMUSCULAR | Status: AC
  Filled 2012-02-04: qty 2

## 2012-02-04 MED ORDER — NEOSTIGMINE METHYLSULFATE 1 MG/ML IJ SOLN
INTRAMUSCULAR | Status: DC | PRN
  Administered 2012-02-04: 3 mg via INTRAVENOUS

## 2012-02-04 MED ORDER — PROPOFOL 10 MG/ML IV EMUL
INTRAVENOUS | Status: AC
  Filled 2012-02-04: qty 40

## 2012-02-04 MED ORDER — EPHEDRINE SULFATE 50 MG/ML IJ SOLN
INTRAMUSCULAR | Status: DC | PRN
  Administered 2012-02-04 (×5): 10 mg via INTRAVENOUS

## 2012-02-04 MED ORDER — SUFENTANIL CITRATE 50 MCG/ML IV SOLN
INTRAVENOUS | Status: DC | PRN
  Administered 2012-02-04: 10 ug via INTRAVENOUS
  Administered 2012-02-04: 20 ug via INTRAVENOUS
  Administered 2012-02-04 (×2): 10 ug via INTRAVENOUS

## 2012-02-04 MED ORDER — BUPIVACAINE HCL (PF) 0.25 % IJ SOLN
INTRAMUSCULAR | Status: DC | PRN
  Administered 2012-02-04: 10 mL

## 2012-02-04 MED ORDER — MIDAZOLAM HCL 2 MG/2ML IJ SOLN
INTRAMUSCULAR | Status: AC
  Filled 2012-02-04: qty 2

## 2012-02-04 MED ORDER — ONDANSETRON HCL 4 MG/2ML IJ SOLN
INTRAMUSCULAR | Status: DC | PRN
  Administered 2012-02-04: 4 mg via INTRAVENOUS

## 2012-02-04 MED ORDER — DEXAMETHASONE SODIUM PHOSPHATE 10 MG/ML IJ SOLN
INTRAMUSCULAR | Status: AC
  Filled 2012-02-04: qty 1

## 2012-02-04 MED ORDER — ONDANSETRON HCL 4 MG/2ML IJ SOLN
INTRAMUSCULAR | Status: AC
  Filled 2012-02-04: qty 2

## 2012-02-04 MED ORDER — NEOSTIGMINE METHYLSULFATE 1 MG/ML IJ SOLN
INTRAMUSCULAR | Status: AC
  Filled 2012-02-04: qty 10

## 2012-02-04 SURGICAL SUPPLY — 59 items
BARRIER ADHS 3X4 INTERCEED (GAUZE/BANDAGES/DRESSINGS) ×12 IMPLANT
CABLE HIGH FREQUENCY MONO STRZ (ELECTRODE) ×3 IMPLANT
CLOTH BEACON ORANGE TIMEOUT ST (SAFETY) ×3 IMPLANT
CONTAINER PREFILL 10% NBF 60ML (FORM) ×3 IMPLANT
COVER MAYO STAND STRL (DRAPES) ×3 IMPLANT
COVER TABLE BACK 60X90 (DRAPES) ×6 IMPLANT
COVER TIP SHEARS 8 DVNC (MISCELLANEOUS) ×2 IMPLANT
COVER TIP SHEARS 8MM DA VINCI (MISCELLANEOUS) ×1
DECANTER SPIKE VIAL GLASS SM (MISCELLANEOUS) ×3 IMPLANT
DERMABOND ADVANCED (GAUZE/BANDAGES/DRESSINGS) ×1
DERMABOND ADVANCED .7 DNX12 (GAUZE/BANDAGES/DRESSINGS) ×2 IMPLANT
DRAPE HUG U DISPOSABLE (DRAPE) ×3 IMPLANT
DRAPE LG THREE QUARTER DISP (DRAPES) ×6 IMPLANT
DRAPE WARM FLUID 44X44 (DRAPE) ×3 IMPLANT
DRESSING TELFA 8X3 (GAUZE/BANDAGES/DRESSINGS) ×3 IMPLANT
ELECT REM PT RETURN 9FT ADLT (ELECTROSURGICAL) ×3
ELECTRODE REM PT RTRN 9FT ADLT (ELECTROSURGICAL) ×2 IMPLANT
EVACUATOR SMOKE 8.L (FILTER) ×3 IMPLANT
GLOVE BIO SURGEON STRL SZ7 (GLOVE) ×3 IMPLANT
GLOVE BIO SURGEON STRL SZ7.5 (GLOVE) ×6 IMPLANT
GLOVE BIOGEL PI IND STRL 6.5 (GLOVE) ×2 IMPLANT
GLOVE BIOGEL PI INDICATOR 6.5 (GLOVE) ×1
GLOVE SURG SS PI 7.0 STRL IVOR (GLOVE) ×12 IMPLANT
GOWN PREVENTION PLUS XLARGE (GOWN DISPOSABLE) ×3 IMPLANT
GOWN STRL REIN XL XLG (GOWN DISPOSABLE) ×18 IMPLANT
IV LACTATED RINGERS 1000ML (IV SOLUTION) ×3 IMPLANT
KIT ACCESSORY DA VINCI DISP (KITS) ×1
KIT ACCESSORY DVNC DISP (KITS) ×2 IMPLANT
LEGGING LITHOTOMY PAIR STRL (DRAPES) ×3 IMPLANT
MANIPULATOR UTERINE 4.5 ZUMI (MISCELLANEOUS) ×3 IMPLANT
NEEDLE HYPO 18GX1.5 BLUNT FILL (NEEDLE) ×3 IMPLANT
NEEDLE INSUFFLATION 14GA 120MM (NEEDLE) ×3 IMPLANT
NS IRRIG 1000ML POUR BTL (IV SOLUTION) ×9 IMPLANT
PACK LAVH (CUSTOM PROCEDURE TRAY) ×3 IMPLANT
PAD PREP 24X48 CUFFED NSTRL (MISCELLANEOUS) ×6 IMPLANT
PROTECTOR NERVE ULNAR (MISCELLANEOUS) ×6 IMPLANT
SET CYSTO W/LG BORE CLAMP LF (SET/KITS/TRAYS/PACK) IMPLANT
SET IRRIG TUBING LAPAROSCOPIC (IRRIGATION / IRRIGATOR) ×3 IMPLANT
SOLUTION ELECTROLUBE (MISCELLANEOUS) ×3 IMPLANT
SPONGE LAP 18X18 X RAY DECT (DISPOSABLE) IMPLANT
SUT VIC AB 0 CT1 27 (SUTURE) ×2
SUT VIC AB 0 CT1 27XBRD ANBCTR (SUTURE) ×4 IMPLANT
SUT VIC AB 0 CT1 27XBRD ANTBC (SUTURE) IMPLANT
SUT VICRYL 0 UR6 27IN ABS (SUTURE) ×6 IMPLANT
SUT VICRYL RAPIDE 4/0 PS 2 (SUTURE) ×6 IMPLANT
SYR 50ML LL SCALE MARK (SYRINGE) ×3 IMPLANT
SYR 5ML LL (SYRINGE) ×3 IMPLANT
SYRINGE 10CC LL (SYRINGE) ×3 IMPLANT
TIP UTERINE 6.7X6CM WHT DISP (MISCELLANEOUS) ×3 IMPLANT
TOWEL OR 17X24 6PK STRL BLUE (TOWEL DISPOSABLE) ×9 IMPLANT
TRAY FOLEY BAG SILVER LF 14FR (CATHETERS) ×3 IMPLANT
TROCAR DISP BLADELESS 8 DVNC (TROCAR) ×2 IMPLANT
TROCAR DISP BLADELESS 8MM (TROCAR) ×1
TROCAR XCEL 12X100 BLDLESS (ENDOMECHANICALS) IMPLANT
TROCAR Z-THREAD 12X150 (TROCAR) ×3 IMPLANT
TROCAR Z-THREAD FIOS 12X100MM (TROCAR) ×3 IMPLANT
TUBING FILTER THERMOFLATOR (ELECTROSURGICAL) ×3 IMPLANT
V-LOC ×6 IMPLANT
WARMER LAPAROSCOPE (MISCELLANEOUS) ×6 IMPLANT

## 2012-02-04 NOTE — Progress Notes (Signed)
Patient ID: Sonia [name], female   DOB: 1985-06-02, 26 y.o.   MRN: [id number] Patient seen and examined. Consent witnessed and signed. No changes noted. Update completed.

## 2012-02-04 NOTE — Op Note (Signed)
02/04/2012  11:14 AM  PATIENT:  Fredrika Fratus  26 y.o. female  PRE-OPERATIVE DIAGNOSIS:  Bilateral ovarian cyst, Endometrioma  POST-OPERATIVE DIAGNOSIS:  Bilateral ovarian cyst, Endometrioma  PROCEDURE:  Procedure(s): ROBOTIC ASSISTED LAPAROSCOPIC OVARIAN CYSTECTOMY(BILATERAL) ABLATION ON ENDOMETRIOSIS CHROMOPERTUBATION LYSIS OF ADHESIONS EXCISION OF CUL-DE-SAC MASS  EXCISION OF ENDOMETRIOSIS  SURGEON:  Surgeon(s): Lovenia Kim, MD Sheronette Clint Bolder, MD  ASSISTANTS: Garwin Brothers, MD   ANESTHESIA:   local and general  ESTIMATED BLOOD LOSS: * No blood loss amount entered *   DRAINS: none   LOCAL MEDICATIONS USED:  MARCAINE     SPECIMEN:  Source of Specimen:  OVARIAN CYST WALLS, BILATERAL, CULDESAC MASS  DISPOSITION OF SPECIMEN:  PATHOLOGY  COUNTS:  YES  DICTATION #: [id number]  PLAN OF CARE: DC HOME  PATIENT DISPOSITION:  PACU - hemodynamically stable.

## 2012-02-04 NOTE — Transfer of Care (Signed)
Immediate Anesthesia Transfer of Care Note  Patient: Sonia [name]  Procedure(s) Performed: Procedure(s) (LRB) with comments: ROBOTIC ASSISTED LAPAROSCOPIC OVARIAN CYSTECTOMY (Bilateral) - With possible ablation of endometriosis, and chromopertubation.   3 hrs. ABLATION ON ENDOMETRIOSIS (N/A) CHROMOPERTUBATION (Bilateral)  Patient Location: PACU  Anesthesia Type: General  Level of Consciousness: awake  Airway & Oxygen Therapy: Patient Spontanous Breathing  Post-op Assessment: Report given to PACU RN and Post -op Vital signs reviewed and stable  Post vital signs: stable  Complications: No apparent anesthesia complications

## 2012-02-04 NOTE — Anesthesia Preprocedure Evaluation (Signed)
Anesthesia Evaluation    Airway       Dental   Pulmonary Recent URI  (Chest clear, took Zpack starting on Friday, feels well,), Resolved,          Cardiovascular     Neuro/Psych    GI/Hepatic Controlled,  Endo/Other    Renal/GU      Musculoskeletal   Abdominal   Peds  Hematology   Anesthesia Other Findings   Reproductive/Obstetrics                           Anesthesia Physical Anesthesia Plan  ASA: II  Anesthesia Plan: General   Post-op Pain Management:    Induction: Intravenous  Airway Management Planned: Oral ETT  Additional Equipment:   Intra-op Plan:   Post-operative Plan:   Informed Consent: I have reviewed the patients History and Physical, chart, labs and discussed the procedure including the risks, benefits and alternatives for the proposed anesthesia with the patient or authorized representative who has indicated his/her understanding and acceptance.   Dental Advisory Given and Dental advisory given  Plan Discussed with: CRNA and Surgeon  Anesthesia Plan Comments: (  Discussed  general anesthesia, including possible nausea, instrumentation of airway, sore throat,pulmonary aspiration, etc. I asked if the were any outstanding questions, or  concerns before we proceeded. )        Anesthesia Quick Evaluation

## 2012-02-05 NOTE — Op Note (Signed)
Sonia [name], Sonia [name]            ACCOUNT NO.:  [id number]  MEDICAL RECORD NO.:  [id number]  LOCATION:  [facility]                          FACILITY:  [facility]  PHYSICIAN:  Lovenia Kim, M.D.DATE OF BIRTH:  February 06, 1986  DATE OF PROCEDURE:  02/04/2012 DATE OF DISCHARGE:  02/04/2012                              OPERATIVE REPORT   DESCRIPTION OF PROCEDURE:  After being apprised of risks of anesthesia, infection, bleeding, injury to abdominal organs, need for repair, delayed versus immediate complications to include bowel and bladder injury, possible need for repair, the patient was brought to the operating room where she was administered a general anesthetic without complications.  Prepped and draped in sterile fashion.  Foley catheter placed.  Cone cannula placed per vagina.  Hooked up for chromopertubation.  Attention was turned to the abdomen whereby after the feet were placed in Yellofin stirrups, an infraumbilical incision was made with a scalpel.  Veress needle placed opening pressure of -2, 3 L of CO2 insufflated without difficulty.  Trocars placed atraumatically. Visualization revealed pelvic adhesive disease, bilateral normal tubes, normal anterior cul-de-sac, some evidence of endometriosis, stated along the anterior peritoneum and the abdominal wall.  Bilateral enlarged ovaries adhered into the cul-de-sac and in the bilateral ovarian fossas. At this time, two robotic ports were made on the right side and one on the left, and a 5-mm port placed on the left, all under direct visualization.  The robot was docked.  ProGrasp, Endo Shears, and PK forceps were placed.  At this time, the space in between the ovaries was better developed.  The ovaries were dissected out of the cul-de-sac and attention turned to the right ovary which was dissected off the right ovarian fossa was densely adherent.  The ureter was identified. Irrigation was used to lyse these adhesions dissecting the  ovary completely off the wall.  The ovary was then opened, the large endometrioma was drained.  Cyst wall was removed.  Irrigation was accomplished.  Good hemostasis noted and it appears on the right side now.  There was a normal-appearing tube with removal of a large endometrioma.  The cyst capsule was then closed using a 3-0 V-Loc Vicryl suture in a continuous running fashion, and the ovary was wrapped in intercede, cul-de-sac masses heard fat like mass was apparent cul-de- sac, which was excised sharply and sent to pathology.  The ureter was identified on the left.  The bowel adhesions were significant on the left side.  These were lysed sharply off the left adnexa exposing a normal left tube and enlarged left ovary which was also dissected out of the cul-de-sac and left ovarian fossa.  It was opened revealing a large endometrioma which was drained.  Cyst wall was excised.  Good hemostasis accomplished after sharp dissection of the cyst wall and complete freeing of the left ovary.  The ovarian capsule was reapproximated using a 3-0 V-Loc Vicryl suture and covered in Interceed.  Interceed was also placed in the right and left ovarian fossa, and the cul-de-sac irrigation was accomplished.  Good hemostasis was noted. Chromopertubation was performed revealing bilateral efflux of normal dye from both tubes without difficulty.  At this time, the procedure was terminated.  Irrigation fluid was aspirated.  Appendix was unable to be visualized.  Liver appears normal.  There was excision previously done of endometriosis in the cul-de-sac.  All specimens were removed and ovarian wall cyst specimens were removed.  There was ablation of endometriosis along the anterior abdominal wall, which was also been done.  At this time, all instruments removed.  The patient's incisions were closed using 0 Vicryl and 4-0 Vicryl and Dermabond.  Instruments removed from the vagina.  The patient tolerated the  procedure well, awakened, and transferred to recovery in good condition.     Lovenia Kim, M.D.     RJT/MEDQ  D:  02/04/2012  T:  02/05/2012  Job:  [id number]

## 2012-02-05 NOTE — Anesthesia Postprocedure Evaluation (Signed)
  Anesthesia Post-op Note  Patient: Sonia [name]  Procedure(s) Performed: Procedure(s) (LRB) with comments: ROBOTIC ASSISTED LAPAROSCOPIC OVARIAN CYSTECTOMY (Bilateral) - With possible ablation of endometriosis, and chromopertubation.   3 hrs. ABLATION ON ENDOMETRIOSIS (N/A) CHROMOPERTUBATION (Bilateral) Patient is awake and responsive. Pain and nausea are reasonably well controlled. Vital signs are stable and clinically acceptable. Oxygen saturation is clinically acceptable. There are no apparent anesthetic complications at this time. Patient is ready for discharge.

## 2012-02-07 ENCOUNTER — Emergency Department (HOSPITAL_COMMUNITY): Payer: BC Managed Care – PPO

## 2012-02-07 ENCOUNTER — Emergency Department (HOSPITAL_COMMUNITY)
Admission: EM | Admit: 2012-02-07 | Discharge: 2012-02-07 | Disposition: A | Discharge status: Home / Self Care | Payer: BC Managed Care – PPO | Attending: Emergency Medicine | Admitting: Emergency Medicine

## 2012-02-07 ENCOUNTER — Encounter (HOSPITAL_COMMUNITY): Payer: Self-pay | Admitting: Emergency Medicine

## 2012-02-07 DIAGNOSIS — N9489 Other specified conditions associated with female genital organs and menstrual cycle: Secondary | ICD-10-CM

## 2012-02-07 DIAGNOSIS — R51 Headache: Secondary | ICD-10-CM | POA: Insufficient documentation

## 2012-02-07 DIAGNOSIS — N898 Other specified noninflammatory disorders of vagina: Secondary | ICD-10-CM | POA: Insufficient documentation

## 2012-02-07 DIAGNOSIS — N939 Abnormal uterine and vaginal bleeding, unspecified: Secondary | ICD-10-CM

## 2012-02-07 DIAGNOSIS — K5090 Crohn's disease, unspecified, without complications: Secondary | ICD-10-CM | POA: Insufficient documentation

## 2012-02-07 DIAGNOSIS — K219 Gastro-esophageal reflux disease without esophagitis: Secondary | ICD-10-CM | POA: Insufficient documentation

## 2012-02-07 LAB — URINALYSIS, ROUTINE W REFLEX MICROSCOPIC
Bilirubin Urine: NEGATIVE
Glucose, UA: NEGATIVE mg/dL
Hgb urine dipstick: NEGATIVE
Ketones, ur: NEGATIVE mg/dL
Leukocytes, UA: NEGATIVE
Nitrite: NEGATIVE
Protein, ur: NEGATIVE mg/dL
Specific Gravity, Urine: 1.015 (ref 1.005–1.030)
Urobilinogen, UA: 1 mg/dL (ref 0.0–1.0)
pH: 7.5 (ref 5.0–8.0)

## 2012-02-07 LAB — CBC WITH DIFFERENTIAL/PLATELET
Basophils Absolute: 0 10*3/uL (ref 0.0–0.1)
Basophils Relative: 0 % (ref 0–1)
Eosinophils Absolute: 0.2 10*3/uL (ref 0.0–0.7)
Eosinophils Relative: 3 % (ref 0–5)
HCT: 31.1 % — ABNORMAL LOW (ref 36.0–46.0)
Hemoglobin: 9.9 g/dL — ABNORMAL LOW (ref 12.0–15.0)
Lymphocytes Relative: 20 % (ref 12–46)
Lymphs Abs: 1.5 10*3/uL (ref 0.7–4.0)
MCH: 26.4 pg (ref 26.0–34.0)
MCHC: 31.8 g/dL (ref 30.0–36.0)
MCV: 82.9 fL (ref 78.0–100.0)
Monocytes Absolute: 0.6 10*3/uL (ref 0.1–1.0)
Monocytes Relative: 7 % (ref 3–12)
Neutro Abs: 5.5 10*3/uL (ref 1.7–7.7)
Neutrophils Relative %: 70 % (ref 43–77)
Platelets: 301 10*3/uL (ref 150–400)
RBC: 3.75 MIL/uL — ABNORMAL LOW (ref 3.87–5.11)
RDW: 13.5 % (ref 11.5–15.5)
WBC: 7.8 10*3/uL (ref 4.0–10.5)

## 2012-02-07 LAB — WET PREP, GENITAL
Clue Cells Wet Prep HPF POC: NONE SEEN
Trich, Wet Prep: NONE SEEN
WBC, Wet Prep HPF POC: NONE SEEN
Yeast Wet Prep HPF POC: NONE SEEN

## 2012-02-07 LAB — COMPREHENSIVE METABOLIC PANEL
ALT: 47 U/L — ABNORMAL HIGH (ref 0–35)
AST: 46 U/L — ABNORMAL HIGH (ref 0–37)
Albumin: 3.1 g/dL — ABNORMAL LOW (ref 3.5–5.2)
Alkaline Phosphatase: 37 U/L — ABNORMAL LOW (ref 39–117)
BUN: 4 mg/dL — ABNORMAL LOW (ref 6–23)
CO2: 28 mEq/L (ref 19–32)
Calcium: 9.1 mg/dL (ref 8.4–10.5)
Chloride: 102 mEq/L (ref 96–112)
Creatinine, Ser: 0.82 mg/dL (ref 0.50–1.10)
GFR calc Af Amer: >90 mL/min (ref >90)
GFR calc non Af Amer: >90 mL/min (ref >90)
Glucose, Bld: 107 mg/dL — ABNORMAL HIGH (ref 70–99)
Potassium: 3.5 mEq/L (ref 3.5–5.1)
Sodium: 138 mEq/L (ref 135–145)
Total Bilirubin: 0.4 mg/dL (ref 0.3–1.2)
Total Protein: 6 g/dL (ref 6.0–8.3)

## 2012-02-07 MED ORDER — ONDANSETRON 4 MG PO TBDP: 4.0000 mg | ORAL_TABLET | Freq: Three times a day (TID) | ORAL | Status: DC | PRN

## 2012-02-07 MED ORDER — MORPHINE SULFATE 4 MG/ML IJ SOLN
4.0000 mg | Freq: Once | INTRAMUSCULAR | Status: AC
  Administered 2012-02-07: 4 mg via INTRAVENOUS
  Filled 2012-02-07: qty 1

## 2012-02-07 MED ORDER — ACETAMINOPHEN 325 MG PO TABS: 650.0000 mg | ORAL_TABLET | Freq: Once | ORAL | Status: DC

## 2012-02-07 MED ORDER — SODIUM CHLORIDE 0.9 % IV BOLUS (SEPSIS)
500.0000 mL | Freq: Once | INTRAVENOUS | Status: AC
  Administered 2012-02-07: 500 mL via INTRAVENOUS

## 2012-02-07 MED ORDER — METOCLOPRAMIDE HCL 5 MG/ML IJ SOLN
10.0000 mg | Freq: Once | INTRAMUSCULAR | Status: AC
  Administered 2012-02-07: 10 mg via INTRAVENOUS
  Filled 2012-02-07: qty 2

## 2012-02-07 NOTE — ED Provider Notes (Signed)
History     CSN: [id number]  Arrival date & time 02/07/12  0301   First MD Initiated Contact with Patient 02/07/12 0316      Chief Complaint  Patient presents with  . Migraine    (Consider location/radiation/quality/duration/timing/severity/associated sxs/prior treatment) HPI Comments: September 18 patient had GYN surgery for endometriosis with extensive adhesions, repositioning of her ovaries.  Now presents with abdominal pain, fever, headache, cough, and vaginal bleeding .she did contact with her surgeon's service, to recommend she come to the emergency department.  I think they probably meant to come to women's, but she presented to [facility] for evaluation  Patient is a 26 y.o. female presenting with migraines. The history is provided by the patient and the spouse.  Migraine This is a new problem. The current episode started today. Associated symptoms include abdominal pain, coughing, a fever, headaches and nausea. Pertinent negatives include no anorexia, urinary symptoms, vomiting or weakness.    Past Medical History  Diagnosis Date  . Crohn's colitis   . GERD (gastroesophageal reflux disease)     no meds    Past Surgical History  Procedure Date  . Hand surgery     with nerve block  . Wisdom tooth extraction   . Laparoscopic endometriosis fulguration     History reviewed. No pertinent family history.  History  Substance Use Topics  . Smoking status: Never Smoker   . Smokeless tobacco: Not on file  . Alcohol Use: Yes     occasionally    OB History    Grav Para Term Preterm Abortions TAB SAB Ect Mult Living                  Review of Systems  Constitutional: Positive for fever.  Respiratory: Positive for cough.   Gastrointestinal: Positive for nausea and abdominal pain. Negative for vomiting and anorexia.  Genitourinary: Positive for vaginal bleeding. Negative for dysuria.  Neurological: Positive for headaches. Negative for dizziness and weakness.     Allergies  Review of patient's allergies indicates no known allergies.  Home Medications   Current Outpatient Rx  Name Route Sig Dispense Refill  . CETIRIZINE HCL 10 MG PO TABS Oral Take 10 mg by mouth daily.    Marland Kitchen DIPHENHYDRAMINE HCL 25 MG PO CAPS Oral Take 25 mg by mouth at bedtime as needed. For itching    . IBUPROFEN 200 MG PO TABS Oral Take 200 mg by mouth every 6 (six) hours as needed. Takes for breakthrough pain    . MESALAMINE ER 250 MG PO CPCR Oral Take 500 mg by mouth 2 (two) times daily as needed. When experiencing the symptoms of Crohn's Disease    . OXYCODONE-ACETAMINOPHEN 5-325 MG PO TABS Oral Take 1-2 tablets by mouth every 4 (four) hours as needed for pain. 40 tablet 0  . ONDANSETRON 4 MG PO TBDP Oral Take 1 tablet (4 mg total) by mouth every 8 (eight) hours as needed. Nausea 20 tablet 0    BP 128/75  Pulse 106  Temp 99 F (37.2 C) (Oral)  Resp 18  SpO2 95%  LMP 02/07/2012  Physical Exam  Constitutional: She appears well-developed and well-nourished.  HENT:  Head: Normocephalic.  Eyes: Pupils are equal, round, and reactive to light.  Neck: Normal range of motion.  Cardiovascular: Tachycardia present.   Pulmonary/Chest: Effort normal.  Abdominal: She exhibits distension. There is tenderness.       Multiple small wounds from the laparoscopic robotic surgery present.  All sealed  with Dermabond.  No sign of erythema or discharge.  At any of the sites  Genitourinary:    There is tenderness on the left labia.       Small amount of dark red blood in the vaginal vault.  Cervical os, observed for 2 minutes.  No active bleeding  at the time.  No indication of vaginal wall trauma    ED Course  Procedures (including critical care time)  Labs Reviewed  CBC WITH DIFFERENTIAL - Abnormal; Notable for the following:    RBC 3.75 (*)     Hemoglobin 9.9 (*)     HCT 31.1 (*)     All other components within normal limits  COMPREHENSIVE METABOLIC PANEL - Abnormal;  Notable for the following:    Glucose, Bld 107 (*)     BUN 4 (*)     Albumin 3.1 (*)     AST 46 (*)     ALT 47 (*)     Alkaline Phosphatase 37 (*)     All other components within normal limits  URINALYSIS, ROUTINE W REFLEX MICROSCOPIC  WET PREP, GENITAL  CULTURE, BLOOD (ROUTINE X 2)  CULTURE, BLOOD (ROUTINE X 2)  URINE CULTURE   No results found.   1. Headache   2. Vaginal bleeding   3. Labial swelling       MDM   Post operative pain , infection         Arman Filter, NP 02/07/12 1610  Arman Filter, NP 02/07/12 [phone]

## 2012-02-07 NOTE — ED Notes (Addendum)
Pt reports having surgery for endometriosis on Wednesday-by Dr Verlee Monte; now having migraines and running fever; feels nausea and lightheadedness; also reports vagina is swollen-has tried ice; also reports having bleeding--but thinks might be menstration

## 2012-02-07 NOTE — ED Notes (Signed)
Pt ambulatory to restroom with steady gait, no increase vaginal bleeding noted on pad in bed. Plan of care is updated with verbal understanding. Family at bedside and will continue to monitor pt, pt is awaiting pelvic exam for further testing at this time.

## 2012-02-07 NOTE — ED Provider Notes (Signed)
Medical screening examination/treatment/procedure(s) were performed by non-physician practitioner and as supervising physician I was immediately available for consultation/collaboration.  Ewald Beg M Draven Natter, MD 02/07/12 0712 

## 2012-02-07 NOTE — ED Notes (Signed)
ED NP into room 

## 2012-02-08 LAB — URINE CULTURE
Colony Count: NO GROWTH
Culture: NO GROWTH

## 2012-02-13 LAB — CULTURE, BLOOD (ROUTINE X 2)
Culture: NO GROWTH
Culture: NO GROWTH

## 2012-03-09 ENCOUNTER — Encounter: Payer: Self-pay | Admitting: Gastroenterology

## 2012-09-10 ENCOUNTER — Encounter: Payer: Self-pay | Admitting: Gastroenterology

## 2012-10-03 LAB — OB RESULTS CONSOLE GC/CHLAMYDIA
Chlamydia: NEGATIVE
Gonorrhea: NEGATIVE

## 2013-03-19 ENCOUNTER — Encounter (HOSPITAL_COMMUNITY): Payer: Self-pay | Admitting: *Deleted

## 2013-03-19 ENCOUNTER — Inpatient Hospital Stay (HOSPITAL_COMMUNITY)
Admission: AD | Admit: 2013-03-19 | Discharge: 2013-03-19 | Disposition: A | Discharge status: Home / Self Care | Payer: BC Managed Care – PPO | Source: Ambulatory Visit | Attending: Obstetrics and Gynecology | Admitting: Obstetrics and Gynecology

## 2013-03-19 DIAGNOSIS — O99891 Other specified diseases and conditions complicating pregnancy: Secondary | ICD-10-CM | POA: Insufficient documentation

## 2013-03-19 LAB — AMNISURE RUPTURE OF MEMBRANE (ROM) NOT AT ARMC: Amnisure ROM: NEGATIVE

## 2013-03-19 NOTE — MAU Provider Note (Signed)
  History     CSN: [id number]  Arrival date and time: 03/19/13 1257   First Provider Initiated Contact with Patient 03/19/13 1459      Chief Complaint  Patient presents with  . Rupture of Membranes   HPI 27 yo G1 at 36.3 wks, here to assess ruptured membranes. Felt a "pop" in vagina on 10/30 and noted vaginal discharge trickle yesterday at 5 pm. No vaginal bleeding, no contractions. Feels good fetal movements. No other complaints.   Past Medical History  Diagnosis Date  . Crohn's colitis   . GERD (gastroesophageal reflux disease)     no meds    Past Surgical History  Procedure Laterality Date  . Hand surgery      with nerve block  . Wisdom tooth extraction    . Laparoscopic endometriosis fulguration      Family History  Problem Relation Age of Onset  . COPD Mother   . Heart disease Mother   . Hernia Mother   . Diverticulitis Mother   . Heart disease Maternal Grandmother   . Cancer Maternal Grandfather     History  Substance Use Topics  . Smoking status: Never Smoker   . Smokeless tobacco: Former Systems developer    Types: Chew  . Alcohol Use: Yes     Comment: occasionally    Allergies: No Known Allergies  Prescriptions prior to admission  Medication Sig Dispense Refill  . butalbital-acetaminophen-caffeine (FIORICET, ESGIC) 50-325-40 MG per tablet Take 1 tablet by mouth 2 (two) times daily as needed for headache.      . cetirizine (ZYRTEC) 10 MG tablet Take 10 mg by mouth daily.      . diphenhydrAMINE (BENADRYL) 25 mg capsule Take 25 mg by mouth at bedtime as needed for allergies. For itching      . Prenatal Vit-Fe Fumarate-FA (PRENATAL MULTIVITAMIN) TABS tablet Take 1 tablet by mouth daily at 12 noon.        ROS  Neg  Physical Exam   Blood pressure 111/70, pulse 84, temperature 98 F (36.7 C), temperature source Oral, resp. rate 16, height 5' 2"  (1.575 m), weight 178 lb (80.74 kg).  Physical Exam A&O x 3, no acute distress. Pleasant HEENT neg Abdo soft, non  tender, non acute, gravid with no UCs. Extr no edema Pelvic white discharge, ferning neg, wet prep negative. Amnisure negative. Cervix closed. FHT 140s/ + accels/ no decels/ moderate variability- reactive NST Toco -none  MAU Course  Procedures Amnisure neg Wet prep neg   Assessment and Plan  27 yo, G1 at 36 wks, no evidence of ruptured membranes. Reactive NST. Discharge home. F/up on 11/4 with Dr Lollie Marrow as scheduled. Labor precautions and fetal kick counts reviewed.  Nollie Shiflett R 03/19/2013, 3:07 PM

## 2013-03-19 NOTE — MAU Note (Signed)
Patient presents with complaint of feeling a "popping" sensation on Thursday 10/30 and noted a small amount of clear leakage last night at 1700.

## 2013-03-26 ENCOUNTER — Encounter (HOSPITAL_COMMUNITY): Payer: Self-pay | Admitting: Family

## 2013-03-26 ENCOUNTER — Inpatient Hospital Stay (HOSPITAL_COMMUNITY)
Admission: AD | Admit: 2013-03-26 | Discharge: 2013-03-29 | DRG: 765 | Disposition: A | Discharge status: Home / Self Care | Payer: BC Managed Care – PPO | Source: Ambulatory Visit | Attending: Obstetrics and Gynecology | Admitting: Obstetrics and Gynecology

## 2013-03-26 DIAGNOSIS — O9903 Anemia complicating the puerperium: Secondary | ICD-10-CM | POA: Diagnosis not present

## 2013-03-26 DIAGNOSIS — O4290 Premature rupture of membranes, unspecified as to length of time between rupture and onset of labor, unspecified weeks of gestation: Secondary | ICD-10-CM | POA: Diagnosis present

## 2013-03-26 DIAGNOSIS — D62 Acute posthemorrhagic anemia: Secondary | ICD-10-CM | POA: Diagnosis not present

## 2013-03-26 HISTORY — DX: Headache: R51

## 2013-03-26 LAB — CBC
HCT: 34.4 % — ABNORMAL LOW (ref 36.0–46.0)
Hemoglobin: 11.9 g/dL — ABNORMAL LOW (ref 12.0–15.0)
MCH: 30.6 pg (ref 26.0–34.0)
MCHC: 34.6 g/dL (ref 30.0–36.0)
MCV: 88.4 fL (ref 78.0–100.0)
Platelets: 215 10*3/uL (ref 150–400)
RBC: 3.89 MIL/uL (ref 3.87–5.11)
RDW: 13.4 % (ref 11.5–15.5)
WBC: 9.5 10*3/uL (ref 4.0–10.5)

## 2013-03-26 LAB — OB RESULTS CONSOLE HEPATITIS B SURFACE ANTIGEN: Hepatitis B Surface Ag: NEGATIVE

## 2013-03-26 LAB — OB RESULTS CONSOLE HIV ANTIBODY (ROUTINE TESTING): HIV: NONREACTIVE

## 2013-03-26 LAB — POCT FERN TEST: POCT Fern Test: POSITIVE

## 2013-03-26 LAB — OB RESULTS CONSOLE ANTIBODY SCREEN: Antibody Screen: NEGATIVE

## 2013-03-26 LAB — OB RESULTS CONSOLE GBS: GBS: NEGATIVE

## 2013-03-26 LAB — OB RESULTS CONSOLE RPR: RPR: NONREACTIVE

## 2013-03-26 LAB — OB RESULTS CONSOLE ABO/RH: RH Type: POSITIVE

## 2013-03-26 LAB — OB RESULTS CONSOLE RUBELLA ANTIBODY, IGM: Rubella: IMMUNE

## 2013-03-26 MED ORDER — OXYCODONE-ACETAMINOPHEN 5-325 MG PO TABS: 1.0000 | ORAL_TABLET | ORAL | Status: DC | PRN

## 2013-03-26 MED ORDER — LIDOCAINE HCL (PF) 1 % IJ SOLN: 30.0000 mL | INTRAMUSCULAR | Status: DC | PRN

## 2013-03-26 MED ORDER — LACTATED RINGERS IV SOLN: 500.0000 mL | INTRAVENOUS | Status: DC | PRN

## 2013-03-26 MED ORDER — ACETAMINOPHEN 325 MG PO TABS: 650.0000 mg | ORAL_TABLET | ORAL | Status: DC | PRN

## 2013-03-26 MED ORDER — DIPHENHYDRAMINE HCL 25 MG PO CAPS
25.0000 mg | ORAL_CAPSULE | Freq: Four times a day (QID) | ORAL | Status: DC | PRN
  Administered 2013-03-26: 25 mg via ORAL
  Filled 2013-03-26: qty 1

## 2013-03-26 MED ORDER — MISOPROSTOL 25 MCG QUARTER TABLET
25.0000 ug | ORAL_TABLET | ORAL | Status: DC | PRN
  Administered 2013-03-26: 25 ug via VAGINAL
  Filled 2013-03-26: qty 0.25

## 2013-03-26 MED ORDER — CITRIC ACID-SODIUM CITRATE 334-500 MG/5ML PO SOLN
30.0000 mL | ORAL | Status: DC | PRN
  Administered 2013-03-27 (×3): 30 mL via ORAL
  Filled 2013-03-26 (×3): qty 15

## 2013-03-26 MED ORDER — OXYTOCIN 10 UNIT/ML IJ SOLN: 10.0000 [IU] | Freq: Once | INTRAMUSCULAR | Status: AC | PRN

## 2013-03-26 MED ORDER — ZOLPIDEM TARTRATE 5 MG PO TABS: 5.0000 mg | ORAL_TABLET | Freq: Every evening | ORAL | Status: DC | PRN

## 2013-03-26 MED ORDER — LACTATED RINGERS IV SOLN
INTRAVENOUS | Status: DC
  Administered 2013-03-27 (×2): via INTRAVENOUS

## 2013-03-26 MED ORDER — IBUPROFEN 600 MG PO TABS: 600.0000 mg | ORAL_TABLET | Freq: Four times a day (QID) | ORAL | Status: DC | PRN

## 2013-03-26 MED ORDER — TERBUTALINE SULFATE 1 MG/ML IJ SOLN: 0.2500 mg | Freq: Once | INTRAMUSCULAR | Status: AC | PRN

## 2013-03-26 MED ORDER — OXYTOCIN 40 UNITS IN LACTATED RINGERS INFUSION - SIMPLE MED
1.0000 m[IU]/min | INTRAVENOUS | Status: DC
  Administered 2013-03-27: 2 m[IU]/min via INTRAVENOUS
  Administered 2013-03-27: 8 m[IU]/min via INTRAVENOUS
  Filled 2013-03-26: qty 1000

## 2013-03-26 NOTE — Progress Notes (Signed)
Taavon to special, but CNM to cover overnight. Will observe then Pit in AM if needed.

## 2013-03-26 NOTE — Progress Notes (Signed)
Sonia [name] is a 27 y.o. G1P0000 at 53w3dby LMP admitted for rupture of membranes  Subjective: Comfortable  Objective: BP 121/62  Pulse 86  Temp(Src) 98.3 F (36.8 C) (Oral)  Resp 18  Ht 5' 3"  (1.6 m)  Wt 81.194 kg (179 lb)  BMI 31.72 kg/m2      FHT:  FHR: 145 bpm, variability: moderate,  accelerations:  Present,  decelerations:  Absent UC:   none SVE:   Dilation: 1 Effacement (%): 60 Station: -3 Exam by:: RSunday Corn CNM   Labs: Lab Results  Component Value Date   WBC 9.5 03/26/2013   HGB 11.9* 03/26/2013   HCT 34.4* 03/26/2013   MCV 88.4 03/26/2013   PLT 215 03/26/2013    Assessment / Plan: SROM at term  Labor: Progressing normally Preeclampsia:  no signs or symptoms of toxicity, intake and ouput balanced and labs stable Fetal Wellbeing:  Category I Pain Control:  Labor support without medications I/D:  n/a Anticipated MOD:  NSVD  Sonia [name] J 03/26/2013, 10:10 PM

## 2013-03-26 NOTE — MAU Note (Signed)
Pt presents with complaints of leakage of fluid that started around 10 this morning and has continued to leak with activity today. Denies any vaginal bleeding, states baby is active.

## 2013-03-26 NOTE — MAU Note (Signed)
27 yo, G1P0 at [redacted]w[redacted]d, presents to MAU with c/o leaking clear fluid since 1030 today. Reports +fm; denies VB.

## 2013-03-26 NOTE — MAU Provider Note (Signed)
  History     CSN: [id number]  Arrival date and time: 03/26/13 1735 Provider on unit: 1715 Provider at bedside: 1800      Chief Complaint  Patient presents with  . Rupture of Membranes   HPI  [name] is a 27 y.o. G1P0 female at 45.[redacted] wks gestation  Past Medical History  Diagnosis Date  . Crohn's colitis   . GERD (gastroesophageal reflux disease)     no meds    Past Surgical History  Procedure Laterality Date  . Hand surgery      with nerve block  . Wisdom tooth extraction    . Laparoscopic endometriosis fulguration      Family History  Problem Relation Age of Onset  . COPD Mother   . Heart disease Mother   . Hernia Mother   . Diverticulitis Mother   . Heart disease Maternal Grandmother   . Cancer Maternal Grandfather     History  Substance Use Topics  . Smoking status: Never Smoker   . Smokeless tobacco: Former Systems developer    Types: Chew  . Alcohol Use: Yes     Comment: occasionally    Allergies: No Known Allergies  Prescriptions prior to admission  Medication Sig Dispense Refill  . butalbital-acetaminophen-caffeine (FIORICET, ESGIC) 50-325-40 MG per tablet Take 1 tablet by mouth 2 (two) times daily as needed for headache.      . cetirizine (ZYRTEC) 10 MG tablet Take 10 mg by mouth daily.      . diphenhydrAMINE (BENADRYL) 25 mg capsule Take 25 mg by mouth at bedtime as needed for allergies. For itching      . Prenatal Vit-Fe Fumarate-FA (PRENATAL MULTIVITAMIN) TABS tablet Take 1 tablet by mouth daily at 12 noon.        Review of Systems  Constitutional: Negative.   HENT: Negative.   Eyes: Negative.   Respiratory: Negative.   Cardiovascular: Negative.   Gastrointestinal: Negative.   Genitourinary:       Leaking clear fluid  Musculoskeletal: Negative.   Skin: Negative.   Neurological: Negative.   Endo/Heme/Allergies: Negative.   Psychiatric/Behavioral: Negative.    Physical Exam   Blood pressure 120/77, pulse 91, temperature 98.9 F  (37.2 C), height 5' 3"  (1.6 m), weight 81.194 kg (179 lb).  Physical Exam  Constitutional: She is oriented to person, place, and time. She appears well-developed and well-nourished.  HENT:  Head: Normocephalic.  Eyes: Pupils are equal, round, and reactive to light.  Neck: Normal range of motion. Neck supple.  Cardiovascular: Normal rate, regular rhythm, normal heart sounds and intact distal pulses.   Respiratory: Effort normal and breath sounds normal.  GI: Soft. Bowel sounds are normal.  Genitourinary:  gravid  Musculoskeletal: Normal range of motion.  Neurological: She is alert and oriented to person, place, and time.  Skin: Skin is warm and dry.  Psychiatric: She has a normal mood and affect. Her behavior is normal. Judgment and thought content normal.   Clear fluid obtained leaking from vaginal introitus - Fern (+) VE: 1/60/vtx/-3 MAU Course  Procedures  CEFM Fern slide  Assessment and Plan  IUP @ 37.3 wks PROM GBS Negative  Admit to L&D  Routine L&D orders

## 2013-03-26 NOTE — H&P (Signed)
OB ADMISSION/ HISTORY & PHYSICAL:  Admission Date: 03/26/2013  5:35 PM  Admit Diagnosis: PROM  Sonia [name] is a 27 y.o. female presenting for rupture of membranes since 1030.  She reports intermittently leaking clear fluid since that time. (+) FM.  She denies VB.  GBS Negative.  Prenatal History: G1P0   EDC : 04/12/2013, by LMP and 6.3 wks U/S Prenatal care at [facility] since 9.[redacted] weeks gestation Primary Care Provider at [place]: Dr. Ronita Hipps  Prenatal course complicated by nothing  Prenatal Labs: ABO, Rh:   A Positive Antibody:  Negative Rubella:   Immune RPR:   Non-Reactive HBsAg:   Non-Reactive HIV:   Non-Reactive GBS:   Negative 1 hr Glucola : 97 mg/dL   Medical / Surgical History :  Past medical history:  Past Medical History  Diagnosis Date  . Crohn's colitis   . GERD (gastroesophageal reflux disease)     no meds  . UUVOZDGU(440.3)      Past surgical history:  Past Surgical History  Procedure Laterality Date  . Hand surgery      with nerve block  . Wisdom tooth extraction    . Laparoscopic endometriosis fulguration       Family History:  Family History  Problem Relation Age of Onset  . COPD Mother   . Heart disease Mother   . Hernia Mother   . Diverticulitis Mother   . Heart disease Maternal Grandmother   . Cancer Maternal Grandfather      Social History:  reports that she has never smoked. She has quit using smokeless tobacco. Her smokeless tobacco use included Chew. She reports that she drinks alcohol. She reports that she does not use illicit drugs.   Allergies: Review of patient's allergies indicates no known allergies.    Current Medications at time of admission:  Prescriptions prior to admission  Medication Sig Dispense Refill  . butalbital-acetaminophen-caffeine (FIORICET, ESGIC) 50-325-40 MG per tablet Take 1 tablet by mouth 2 (two) times daily as needed for headache.      . cetirizine (ZYRTEC) 10 MG  tablet Take 10 mg by mouth daily.      . diphenhydrAMINE (BENADRYL) 25 mg capsule Take 25 mg by mouth at bedtime as needed for allergies. For itching      . Prenatal Vit-Fe Fumarate-FA (PRENATAL MULTIVITAMIN) TABS tablet Take 1 tablet by mouth daily at 12 noon.         Review of Systems: Review of Systems  Constitutional: Negative.        Temp @ home was 98.4 degrees  HENT: Negative.   Eyes: Negative.   Respiratory: Negative.   Cardiovascular: Negative.   Gastrointestinal: Negative.        (+) FM; no UC's  Genitourinary:       Leaking clear fluid since 1030  Musculoskeletal: Negative.   Skin: Negative.   Neurological: Negative.   Endo/Heme/Allergies: Negative.   Psychiatric/Behavioral: Negative.    Physical Exam:  Dilation: 1 Effacement (%): 60 Station: -3 Exam by:: Sunday Corn, CNM  Filed Vitals:   03/26/13 1824  BP: 114/75  Pulse: 98  Temp: 98.7 F (37.1 C)  Resp: 16    General: A&O x3, NAD Heart: RRR, no murmurs Lungs: CTAB Abdomen: soft non-tender, normal bowel sounds Extremities: Full ROM Genitalia / VE: perineum wet with clear fluid, 1/60/-3/vtx FHR: 135 category 1 TOCO: none   Labs:    No results found for this basename: WBC, HGB, HCT, PLT,  in  the last 72 hours   Assessment:  27 y.o. G1P0 at 69w3dPROM at term GBS neg History of endometriosis  1. Labor: none, will ripen with cytotec then start Pitocin augmentation 2. Fetal Wellbeing: Category 1  3. Pain Control: none 4. GBS: Negative  Plan:  1. Admit to BS 2. See  L&D orders 3. Analgesia/anesthesia PRN     DGraceann Congress MSN, CNM 03/26/2013, 6:30 PM

## 2013-03-27 ENCOUNTER — Inpatient Hospital Stay (HOSPITAL_COMMUNITY): Payer: BC Managed Care – PPO | Admitting: Anesthesiology

## 2013-03-27 ENCOUNTER — Encounter (HOSPITAL_COMMUNITY): Payer: Self-pay | Admitting: Obstetrics and Gynecology

## 2013-03-27 ENCOUNTER — Encounter (HOSPITAL_COMMUNITY)
Admission: AD | Disposition: A | Discharge status: Home / Self Care | Payer: Self-pay | Source: Ambulatory Visit | Attending: Obstetrics and Gynecology

## 2013-03-27 ENCOUNTER — Encounter (HOSPITAL_COMMUNITY): Payer: BC Managed Care – PPO | Admitting: Anesthesiology

## 2013-03-27 LAB — RPR: RPR Ser Ql: NONREACTIVE

## 2013-03-27 SURGERY — Surgical Case
Anesthesia: Epidural | Site: Abdomen | Wound class: Clean

## 2013-03-27 MED ORDER — MORPHINE SULFATE (PF) 0.5 MG/ML IJ SOLN
INTRAMUSCULAR | Status: DC | PRN
  Administered 2013-03-27: 1 mg via EPIDURAL

## 2013-03-27 MED ORDER — IBUPROFEN 600 MG PO TABS: 600.0000 mg | ORAL_TABLET | Freq: Four times a day (QID) | ORAL | Status: DC | PRN

## 2013-03-27 MED ORDER — DIBUCAINE 1 % RE OINT: 1.0000 "application " | TOPICAL_OINTMENT | RECTAL | Status: DC | PRN

## 2013-03-27 MED ORDER — MEPERIDINE HCL 25 MG/ML IJ SOLN
INTRAMUSCULAR | Status: AC
  Filled 2013-03-27: qty 1

## 2013-03-27 MED ORDER — LACTATED RINGERS IV SOLN: INTRAVENOUS | Status: DC

## 2013-03-27 MED ORDER — LACTATED RINGERS IV SOLN: 500.0000 mL | INTRAVENOUS | Status: DC | PRN

## 2013-03-27 MED ORDER — BUTORPHANOL TARTRATE 1 MG/ML IJ SOLN: 1.0000 mg | INTRAMUSCULAR | Status: DC | PRN

## 2013-03-27 MED ORDER — DIPHENHYDRAMINE HCL 25 MG PO CAPS
25.0000 mg | ORAL_CAPSULE | ORAL | Status: DC | PRN
  Administered 2013-03-28 (×2): 25 mg via ORAL

## 2013-03-27 MED ORDER — PROMETHAZINE HCL 25 MG/ML IJ SOLN: 6.2500 mg | INTRAMUSCULAR | Status: DC | PRN

## 2013-03-27 MED ORDER — OXYTOCIN 40 UNITS IN LACTATED RINGERS INFUSION - SIMPLE MED: 62.5000 mL/h | INTRAVENOUS | Status: AC

## 2013-03-27 MED ORDER — FLEET ENEMA 7-19 GM/118ML RE ENEM: 1.0000 | ENEMA | RECTAL | Status: DC | PRN

## 2013-03-27 MED ORDER — DIPHENHYDRAMINE HCL 25 MG PO CAPS
25.0000 mg | ORAL_CAPSULE | Freq: Four times a day (QID) | ORAL | Status: DC | PRN
  Filled 2013-03-27 (×2): qty 1

## 2013-03-27 MED ORDER — LIDOCAINE-EPINEPHRINE (PF) 2 %-1:200000 IJ SOLN
INTRAMUSCULAR | Status: AC
  Filled 2013-03-27: qty 20

## 2013-03-27 MED ORDER — LIDOCAINE HCL (PF) 1 % IJ SOLN
INTRAMUSCULAR | Status: DC | PRN
  Administered 2013-03-27 (×2): 4 mL

## 2013-03-27 MED ORDER — ACETAMINOPHEN 325 MG PO TABS: 650.0000 mg | ORAL_TABLET | ORAL | Status: DC | PRN

## 2013-03-27 MED ORDER — KETOROLAC TROMETHAMINE 30 MG/ML IJ SOLN
30.0000 mg | Freq: Four times a day (QID) | INTRAMUSCULAR | Status: AC | PRN
  Administered 2013-03-27: 30 mg via INTRAVENOUS

## 2013-03-27 MED ORDER — SIMETHICONE 80 MG PO CHEW
80.0000 mg | CHEWABLE_TABLET | Freq: Three times a day (TID) | ORAL | Status: DC
  Administered 2013-03-28 (×3): 80 mg via ORAL
  Filled 2013-03-27 (×3): qty 1

## 2013-03-27 MED ORDER — LANOLIN HYDROUS EX OINT: 1.0000 "application " | TOPICAL_OINTMENT | CUTANEOUS | Status: DC | PRN

## 2013-03-27 MED ORDER — OXYTOCIN 10 UNIT/ML IJ SOLN
40.0000 [IU] | INTRAVENOUS | Status: DC | PRN
  Administered 2013-03-27: 40 [IU] via INTRAVENOUS

## 2013-03-27 MED ORDER — DIPHENHYDRAMINE HCL 50 MG/ML IJ SOLN
12.5000 mg | INTRAMUSCULAR | Status: DC | PRN
  Administered 2013-03-27 – 2013-03-28 (×2): 12.5 mg via INTRAVENOUS
  Filled 2013-03-27 (×2): qty 1

## 2013-03-27 MED ORDER — DIPHENHYDRAMINE HCL 50 MG/ML IJ SOLN: 12.5000 mg | INTRAMUSCULAR | Status: DC | PRN

## 2013-03-27 MED ORDER — EPHEDRINE 5 MG/ML INJ: 10.0000 mg | INTRAVENOUS | Status: DC | PRN

## 2013-03-27 MED ORDER — NALOXONE HCL 1 MG/ML IJ SOLN: 1.0000 ug/kg/h | INTRAVENOUS | Status: DC | PRN

## 2013-03-27 MED ORDER — MENTHOL 3 MG MT LOZG: 1.0000 | LOZENGE | OROMUCOSAL | Status: DC | PRN

## 2013-03-27 MED ORDER — PRENATAL MULTIVITAMIN CH
1.0000 | ORAL_TABLET | Freq: Every day | ORAL | Status: DC
  Administered 2013-03-28 – 2013-03-29 (×2): 1 via ORAL
  Filled 2013-03-27 (×2): qty 1

## 2013-03-27 MED ORDER — DIPHENHYDRAMINE HCL 50 MG/ML IJ SOLN: 25.0000 mg | INTRAMUSCULAR | Status: DC | PRN

## 2013-03-27 MED ORDER — NALBUPHINE HCL 10 MG/ML IJ SOLN
5.0000 mg | INTRAMUSCULAR | Status: DC | PRN
  Filled 2013-03-27: qty 1

## 2013-03-27 MED ORDER — NALOXONE HCL 0.4 MG/ML IJ SOLN: 0.4000 mg | INTRAMUSCULAR | Status: DC | PRN

## 2013-03-27 MED ORDER — SCOPOLAMINE 1 MG/3DAYS TD PT72
1.0000 | MEDICATED_PATCH | Freq: Once | TRANSDERMAL | Status: DC
  Administered 2013-03-27: 1.5 mg via TRANSDERMAL

## 2013-03-27 MED ORDER — SODIUM BICARBONATE 8.4 % IV SOLN
INTRAVENOUS | Status: AC
  Filled 2013-03-27: qty 50

## 2013-03-27 MED ORDER — MORPHINE SULFATE (PF) 0.5 MG/ML IJ SOLN
INTRAMUSCULAR | Status: DC | PRN
  Administered 2013-03-27: 4 mg via EPIDURAL

## 2013-03-27 MED ORDER — LACTATED RINGERS IV SOLN
INTRAVENOUS | Status: DC
  Administered 2013-03-28: 04:00:00 via INTRAVENOUS

## 2013-03-27 MED ORDER — CITRIC ACID-SODIUM CITRATE 334-500 MG/5ML PO SOLN: 30.0000 mL | ORAL | Status: DC | PRN

## 2013-03-27 MED ORDER — BUPIVACAINE HCL (PF) 0.25 % IJ SOLN
INTRAMUSCULAR | Status: DC | PRN
  Administered 2013-03-27: 10 mL

## 2013-03-27 MED ORDER — MEPERIDINE HCL 25 MG/ML IJ SOLN: 6.2500 mg | INTRAMUSCULAR | Status: DC | PRN

## 2013-03-27 MED ORDER — PHENYLEPHRINE 40 MCG/ML (10ML) SYRINGE FOR IV PUSH (FOR BLOOD PRESSURE SUPPORT): 80.0000 ug | PREFILLED_SYRINGE | INTRAVENOUS | Status: DC | PRN

## 2013-03-27 MED ORDER — METOCLOPRAMIDE HCL 5 MG/ML IJ SOLN
10.0000 mg | Freq: Three times a day (TID) | INTRAMUSCULAR | Status: DC | PRN
  Administered 2013-03-28: 10 mg via INTRAVENOUS
  Filled 2013-03-27: qty 2

## 2013-03-27 MED ORDER — PHENYLEPHRINE HCL 10 MG/ML IJ SOLN
INTRAMUSCULAR | Status: AC
  Filled 2013-03-27: qty 4

## 2013-03-27 MED ORDER — FENTANYL 2.5 MCG/ML BUPIVACAINE 1/10 % EPIDURAL INFUSION (WH - ANES)
14.0000 mL/h | INTRAMUSCULAR | Status: DC | PRN
  Administered 2013-03-27: 14 mL/h via EPIDURAL
  Filled 2013-03-27 (×2): qty 125

## 2013-03-27 MED ORDER — ONDANSETRON HCL 4 MG/2ML IJ SOLN: 4.0000 mg | Freq: Three times a day (TID) | INTRAMUSCULAR | Status: DC | PRN

## 2013-03-27 MED ORDER — SODIUM BICARBONATE 8.4 % IV SOLN
INTRAVENOUS | Status: DC | PRN
  Administered 2013-03-27 (×2): 5 mL via EPIDURAL

## 2013-03-27 MED ORDER — KETOROLAC TROMETHAMINE 30 MG/ML IJ SOLN
INTRAMUSCULAR | Status: AC
  Filled 2013-03-27: qty 1

## 2013-03-27 MED ORDER — ONDANSETRON HCL 4 MG PO TABS: 4.0000 mg | ORAL_TABLET | ORAL | Status: DC | PRN

## 2013-03-27 MED ORDER — LIDOCAINE HCL (PF) 1 % IJ SOLN: 30.0000 mL | INTRAMUSCULAR | Status: DC | PRN

## 2013-03-27 MED ORDER — FENTANYL 2.5 MCG/ML BUPIVACAINE 1/10 % EPIDURAL INFUSION (WH - ANES): 14.0000 mL/h | INTRAMUSCULAR | Status: DC | PRN

## 2013-03-27 MED ORDER — ZOLPIDEM TARTRATE 5 MG PO TABS: 5.0000 mg | ORAL_TABLET | Freq: Every evening | ORAL | Status: DC | PRN

## 2013-03-27 MED ORDER — OXYTOCIN BOLUS FROM INFUSION: 500.0000 mL | INTRAVENOUS | Status: DC

## 2013-03-27 MED ORDER — FENTANYL 2.5 MCG/ML BUPIVACAINE 1/10 % EPIDURAL INFUSION (WH - ANES)
INTRAMUSCULAR | Status: DC | PRN
  Administered 2013-03-27: 14 mL/h via EPIDURAL

## 2013-03-27 MED ORDER — KETOROLAC TROMETHAMINE 30 MG/ML IJ SOLN: 30.0000 mg | Freq: Four times a day (QID) | INTRAMUSCULAR | Status: AC | PRN

## 2013-03-27 MED ORDER — MORPHINE SULFATE 0.5 MG/ML IJ SOLN
INTRAMUSCULAR | Status: AC
Start: 2013-03-27 — End: 2013-03-27
  Filled 2013-03-27: qty 10

## 2013-03-27 MED ORDER — METHYLERGONOVINE MALEATE 0.2 MG PO TABS: 0.2000 mg | ORAL_TABLET | ORAL | Status: DC | PRN

## 2013-03-27 MED ORDER — BUTORPHANOL TARTRATE 1 MG/ML IJ SOLN
INTRAMUSCULAR | Status: AC
  Administered 2013-03-27: 1 mg
  Filled 2013-03-27: qty 1

## 2013-03-27 MED ORDER — TETANUS-DIPHTH-ACELL PERTUSSIS 5-2.5-18.5 LF-MCG/0.5 IM SUSP: 0.5000 mL | Freq: Once | INTRAMUSCULAR | Status: DC

## 2013-03-27 MED ORDER — OXYCODONE-ACETAMINOPHEN 5-325 MG PO TABS: 1.0000 | ORAL_TABLET | ORAL | Status: DC | PRN

## 2013-03-27 MED ORDER — ONDANSETRON HCL 4 MG/2ML IJ SOLN
4.0000 mg | Freq: Four times a day (QID) | INTRAMUSCULAR | Status: DC | PRN
  Administered 2013-03-27: 4 mg via INTRAVENOUS
  Filled 2013-03-27: qty 2

## 2013-03-27 MED ORDER — OXYTOCIN 40 UNITS IN LACTATED RINGERS INFUSION - SIMPLE MED: 62.5000 mL/h | INTRAVENOUS | Status: DC

## 2013-03-27 MED ORDER — SIMETHICONE 80 MG PO CHEW
80.0000 mg | CHEWABLE_TABLET | ORAL | Status: DC
  Filled 2013-03-27: qty 1

## 2013-03-27 MED ORDER — SODIUM CHLORIDE 0.9 % IJ SOLN: 3.0000 mL | INTRAMUSCULAR | Status: DC | PRN

## 2013-03-27 MED ORDER — LACTATED RINGERS IV SOLN
500.0000 mL | Freq: Once | INTRAVENOUS | Status: AC
  Administered 2013-03-27: 500 mL via INTRAVENOUS

## 2013-03-27 MED ORDER — SCOPOLAMINE 1 MG/3DAYS TD PT72
MEDICATED_PATCH | TRANSDERMAL | Status: AC
  Filled 2013-03-27: qty 1

## 2013-03-27 MED ORDER — ONDANSETRON HCL 4 MG/2ML IJ SOLN
INTRAMUSCULAR | Status: AC
  Filled 2013-03-27: qty 2

## 2013-03-27 MED ORDER — PHENYLEPHRINE 40 MCG/ML (10ML) SYRINGE FOR IV PUSH (FOR BLOOD PRESSURE SUPPORT)
80.0000 ug | PREFILLED_SYRINGE | INTRAVENOUS | Status: DC | PRN
  Filled 2013-03-27: qty 10

## 2013-03-27 MED ORDER — MEPERIDINE HCL 25 MG/ML IJ SOLN
INTRAMUSCULAR | Status: DC | PRN
  Administered 2013-03-27 (×2): 12.5 mg via INTRAVENOUS

## 2013-03-27 MED ORDER — IBUPROFEN 600 MG PO TABS
600.0000 mg | ORAL_TABLET | Freq: Four times a day (QID) | ORAL | Status: DC
  Administered 2013-03-28 – 2013-03-29 (×6): 600 mg via ORAL
  Filled 2013-03-27 (×6): qty 1

## 2013-03-27 MED ORDER — EPHEDRINE 5 MG/ML INJ
10.0000 mg | INTRAVENOUS | Status: DC | PRN
  Filled 2013-03-27: qty 4

## 2013-03-27 MED ORDER — OXYCODONE-ACETAMINOPHEN 5-325 MG PO TABS
1.0000 | ORAL_TABLET | ORAL | Status: DC | PRN
  Administered 2013-03-29 (×2): 1 via ORAL
  Filled 2013-03-27 (×2): qty 1

## 2013-03-27 MED ORDER — METHYLERGONOVINE MALEATE 0.2 MG/ML IJ SOLN: 0.2000 mg | INTRAMUSCULAR | Status: DC | PRN

## 2013-03-27 MED ORDER — HYDROMORPHONE HCL PF 1 MG/ML IJ SOLN: 0.2500 mg | INTRAMUSCULAR | Status: DC | PRN

## 2013-03-27 MED ORDER — WITCH HAZEL-GLYCERIN EX PADS: 1.0000 "application " | MEDICATED_PAD | CUTANEOUS | Status: DC | PRN

## 2013-03-27 MED ORDER — BUPIVACAINE HCL (PF) 0.25 % IJ SOLN
INTRAMUSCULAR | Status: AC
  Filled 2013-03-27: qty 30

## 2013-03-27 MED ORDER — OXYTOCIN 10 UNIT/ML IJ SOLN
INTRAMUSCULAR | Status: AC
  Filled 2013-03-27: qty 4

## 2013-03-27 MED ORDER — SIMETHICONE 80 MG PO CHEW: 80.0000 mg | CHEWABLE_TABLET | ORAL | Status: DC | PRN

## 2013-03-27 MED ORDER — DEXTROSE 5 % IV SOLN
2.0000 g | INTRAVENOUS | Status: AC
  Administered 2013-03-27: 2 g via INTRAVENOUS
  Filled 2013-03-27: qty 2

## 2013-03-27 MED ORDER — ONDANSETRON HCL 4 MG/2ML IJ SOLN
INTRAMUSCULAR | Status: DC | PRN
  Administered 2013-03-27: 4 mg via INTRAVENOUS

## 2013-03-27 MED ORDER — SENNOSIDES-DOCUSATE SODIUM 8.6-50 MG PO TABS
2.0000 | ORAL_TABLET | ORAL | Status: DC
  Administered 2013-03-28: 2 via ORAL
  Filled 2013-03-27 (×2): qty 2

## 2013-03-27 MED ORDER — ONDANSETRON HCL 4 MG/2ML IJ SOLN
4.0000 mg | INTRAMUSCULAR | Status: DC | PRN
  Administered 2013-03-27: 4 mg via INTRAVENOUS
  Filled 2013-03-27: qty 2

## 2013-03-27 MED ORDER — PHENYLEPHRINE HCL 10 MG/ML IJ SOLN
INTRAMUSCULAR | Status: DC | PRN
  Administered 2013-03-27 (×2): 40 ug via INTRAVENOUS
  Administered 2013-03-27: 80 ug via INTRAVENOUS
  Administered 2013-03-27: 120 ug via INTRAVENOUS
  Administered 2013-03-27: 80 ug via INTRAVENOUS
  Administered 2013-03-27: 40 ug via INTRAVENOUS
  Administered 2013-03-27 (×2): 80 ug via INTRAVENOUS
  Administered 2013-03-27: 40 ug via INTRAVENOUS
  Administered 2013-03-27: 80 ug via INTRAVENOUS

## 2013-03-27 SURGICAL SUPPLY — 29 items
CLAMP CORD UMBIL (MISCELLANEOUS) IMPLANT
CLOTH BEACON ORANGE TIMEOUT ST (SAFETY) ×2 IMPLANT
CONTAINER PREFILL 10% NBF 15ML (MISCELLANEOUS) IMPLANT
DRAPE LG THREE QUARTER DISP (DRAPES) ×2 IMPLANT
DRSG OPSITE POSTOP 4X10 (GAUZE/BANDAGES/DRESSINGS) ×2 IMPLANT
DURAPREP 26ML APPLICATOR (WOUND CARE) ×2 IMPLANT
ELECT REM PT RETURN 9FT ADLT (ELECTROSURGICAL) ×2
ELECTRODE REM PT RTRN 9FT ADLT (ELECTROSURGICAL) ×1 IMPLANT
EXTRACTOR VACUUM M CUP 4 TUBE (SUCTIONS) IMPLANT
GLOVE BIO SURGEON STRL SZ7.5 (GLOVE) ×2 IMPLANT
GOWN PREVENTION PLUS XLARGE (GOWN DISPOSABLE) ×2 IMPLANT
GOWN STRL REIN XL XLG (GOWN DISPOSABLE) ×4 IMPLANT
HEMOSTAT SURGICEL 2X3 (HEMOSTASIS) ×4 IMPLANT
KIT ABG SYR 3ML LUER SLIP (SYRINGE) IMPLANT
NEEDLE HYPO 25X1 1.5 SAFETY (NEEDLE) ×2 IMPLANT
NEEDLE HYPO 25X5/8 SAFETYGLIDE (NEEDLE) IMPLANT
NS IRRIG 1000ML POUR BTL (IV SOLUTION) ×2 IMPLANT
PACK C SECTION WH (CUSTOM PROCEDURE TRAY) ×2 IMPLANT
STAPLER VISISTAT 35W (STAPLE) ×2 IMPLANT
SUT MNCRL 0 VIOLET CTX 36 (SUTURE) ×3 IMPLANT
SUT MON AB 2-0 CT1 27 (SUTURE) ×2 IMPLANT
SUT MON AB-0 CT1 36 (SUTURE) ×2 IMPLANT
SUT MONOCRYL 0 CTX 36 (SUTURE) ×3
SUT PLAIN 0 NONE (SUTURE) IMPLANT
SUT PLAIN 2 0 XLH (SUTURE) ×2 IMPLANT
SYR CONTROL 10ML LL (SYRINGE) ×2 IMPLANT
TOWEL OR 17X24 6PK STRL BLUE (TOWEL DISPOSABLE) ×2 IMPLANT
TRAY FOLEY CATH 14FR (SET/KITS/TRAYS/PACK) ×2 IMPLANT
WATER STERILE IRR 1000ML POUR (IV SOLUTION) ×2 IMPLANT

## 2013-03-27 NOTE — Progress Notes (Signed)
Sonia [name] is a 27 y.o. G1P0000 at 53w4dby LMP admitted for rupture of membranes for augmentation of labor.  Subjective: Comfortable.  Objective: BP 92/43  Pulse 61  Temp(Src) 98.6 F (37 C) (Oral)  Resp 20  Ht 5' 3"  (1.6 m)  Wt 81.194 kg (179 lb)  BMI 31.72 kg/m2  SpO2 100%      FHT:  FHR: 125 bpm, variability: moderate,  accelerations:  Present,  decelerations:  Present occ variable decel UC:   regular, every 3 minutes SVE:   Dilation: 8.5 Effacement (%): 90 Station: -1 Exam by:: [id number]  Labs: Lab Results  Component Value Date   WBC 9.5 03/26/2013   HGB 11.9* 03/26/2013   HCT 34.4* 03/26/2013   MCV 88.4 03/26/2013   PLT 215 03/26/2013    Assessment / Plan: Arrest in active phase of labor  Labor: no progress despite adequate labor by IUPC Preeclampsia:  intake and ouput balanced and labs stable Fetal Wellbeing:  Category I Pain Control:  Epidural I/D:  n/a Anticipated MOD:  Proceed with cesarean section. Risks vs benefits discussed. Consent done. OR notified.  Dede Dobesh J 03/27/2013, 5:45 PM

## 2013-03-27 NOTE — Progress Notes (Signed)
Patient ID: Sonia [name], female   DOB: 10/19/1985, 27 y.o.   MRN: [id number] S: Doing well, minimal pain with occasional contractions.  Unsure of pain management plans.  OCeasar Mons Vitals:   03/27/13 0140 03/27/13 0344 03/27/13 0536 03/27/13 0709  BP:  111/68  106/94  Pulse:  74  55  Temp: 98 F (36.7 C) 98 F (36.7 C) 98.2 F (36.8 C) 98.4 F (36.9 C)  TempSrc: Oral Oral Oral Oral  Resp: 18 16 18 18   Height:      Weight:      SpO2:         FHT:  FHR: 130 bpm, variability: moderate,  accelerations:  Present,  decelerations:  Absent UC:   regular, every 2-4 minutes SVE:   Dilation: 2.5 Effacement (%): 80 Station: -3 Exam by:: A. Earna Coder, RNC   A / P: Augmentation of labor, progressing well PROM  Fetal Wellbeing:  Category I Pain Control:  Labor support without medications  Anticipated MOD:  NSVD  Discussed with patient and family that Dr. Billy Coast will assume care this morning.  Kenard Gower, MSN, CNM 03/27/2013, 7:20 AM

## 2013-03-27 NOTE — Transfer of Care (Signed)
Immediate Anesthesia Transfer of Care Note  Patient: Sonia [name]  Procedure(s) Performed: Procedure(s): CESAREAN SECTION (N/A)  Patient Location: PACU  Anesthesia Type:Epidural  Level of Consciousness: awake, alert  and oriented  Airway & Oxygen Therapy: Patient Spontanous Breathing  Post-op Assessment: Report given to PACU RN and Post -op Vital signs reviewed and stable  Post vital signs: Reviewed and stable  Complications: No apparent anesthesia complications

## 2013-03-27 NOTE — Anesthesia Procedure Notes (Signed)
Epidural  Start time: 03/27/2013 10:31 AM End time: 03/27/2013 10:41 AM  Staffing Anesthesiologist: Lewie Loron R Performed by: anesthesiologist   Preanesthetic Checklist Completed: patient identified, pre-op evaluation, timeout performed, IV checked, risks and benefits discussed and monitors and equipment checked  Epidural Patient position: sitting Prep: site prepped and draped and DuraPrep Patient monitoring: heart rate, continuous pulse ox and blood pressure Approach: midline Injection technique: LOR saline and LOR air  Needle:  Needle type: Tuohy  Needle gauge: 17 G Needle length: 9 cm Needle insertion depth: 6 cm Catheter type: closed end flexible Catheter size: 19 Gauge Catheter at skin depth: 12 cm Test dose: negative  Assessment Sensory level: T7 Events: blood not aspirated, injection not painful, no injection resistance, negative IV test and no paresthesia

## 2013-03-27 NOTE — Anesthesia Preprocedure Evaluation (Signed)
Anesthesia Evaluation  Patient identified by MRN, date of birth, ID band Patient awake    Reviewed: Allergy & Precautions, H&P , NPO status , Patient's Chart, lab work & pertinent test results  Airway Mallampati: II TM Distance: >3 FB     Dental  (+) Dental Advisory Given   Pulmonary neg pulmonary ROS, Recent URI: Chest clear, took Zpack starting on Friday, feels well,,          Cardiovascular negative cardio ROS  Rhythm:Regular     Neuro/Psych  Headaches, PSYCHIATRIC DISORDERS Anxiety    GI/Hepatic Neg liver ROS, GERD-  Medicated,  Endo/Other  negative endocrine ROS  Renal/GU negative Renal ROS     Musculoskeletal negative musculoskeletal ROS (+)   Abdominal   Peds  Hematology negative hematology ROS (+)   Anesthesia Other Findings   Reproductive/Obstetrics (+) Pregnancy                           Anesthesia Physical  Anesthesia Plan  ASA: II  Anesthesia Plan: Epidural   Post-op Pain Management:    Induction:   Airway Management Planned:   Additional Equipment:   Intra-op Plan:   Post-operative Plan:   Informed Consent: I have reviewed the patients History and Physical, chart, labs and discussed the procedure including the risks, benefits and alternatives for the proposed anesthesia with the patient or authorized representative who has indicated his/her understanding and acceptance.     Plan Discussed with:   Anesthesia Plan Comments:         Anesthesia Quick Evaluation

## 2013-03-27 NOTE — Progress Notes (Signed)
Michaelah Cope is a 27 y.o. G1P0000 at 56w4dby LMP admitted for rupture of membranes  Subjective: Uncomfortable  Objective: BP 96/60  Pulse 118  Temp(Src) 98.1 F (36.7 C) (Oral)  Resp 20  Ht 5' 3"  (1.6 m)  Wt 81.194 kg (179 lb)  BMI 31.72 kg/m2  SpO2 100%      FHT:  FHR: 145 bpm, variability: moderate,  accelerations:  Present,  decelerations:  Absent UC:   regular, every 2-4 minutes SVE:   Dilation: 4 Effacement (%): 90 Station: -2;-1 Exam by:: SJule EconomyRNC  Labs: Lab Results  Component Value Date   WBC 9.5 03/26/2013   HGB 11.9* 03/26/2013   HCT 34.4* 03/26/2013   MCV 88.4 03/26/2013   PLT 215 03/26/2013    Assessment / Plan: Augmentation of labor, progressing well  Labor: Progressing normally Preeclampsia:  no signs or symptoms of toxicity, intake and ouput balanced and labs stable Fetal Wellbeing:  Category I Pain Control:  Labor support without medications and anticipating epidural I/D:  n/a Anticipated MOD:  NSVD  Akeia Perot J 03/27/2013, 12:11 PM

## 2013-03-27 NOTE — Progress Notes (Signed)
Call from RN re: Patient is concerned what effect Ambien will have on her.  The patient requests to take Benadryl instead of Ambien for sleep.  Ok to take Benadryl.  Orders written.  Kenard Gower., MSN, CNM 03/26/2013, 11:04 PM

## 2013-03-27 NOTE — Progress Notes (Signed)
Sonia [name] is a 27 y.o. G1P0000 at 36w4dby LMP admitted for rupture of membranes at term.  Subjective: Comfortable with epidural  Objective: BP 104/49  Pulse 55  Temp(Src) 98.3 F (36.8 C) (Oral)  Resp 20  Ht 5' 3"  (1.6 m)  Wt 81.194 kg (179 lb)  BMI 31.72 kg/m2  SpO2 100%      FHT:  FHR: 125 bpm, variability: moderate,  accelerations:  Present,  decelerations:  Absent UC:   regular, every 2-4 minutes SVE:   7-8/90/-1/poorly applied to cervix/asynclitic IUPC placed without difficulty  Labs: Lab Results  Component Value Date   WBC 9.5 03/26/2013   HGB 11.9* 03/26/2013   HCT 34.4* 03/26/2013   MCV 88.4 03/26/2013   PLT 215 03/26/2013     Assessment / Plan: Active phase on augmentation Asynclitic presentation  Labor: Good progress, will reposition Preeclampsia:  no signs or symptoms of toxicity and labs stable Fetal Wellbeing:  Category I Pain Control:  Epidural I/D:  n/a Anticipated MOD:  NSVD vs csec  Querida Beretta J 03/27/2013, 2:03 PM

## 2013-03-27 NOTE — Progress Notes (Signed)
Patient ID: Sonia [name], female   DOB: Feb 09, 1986, 27 y.o.   MRN: [id number] S: Doing well, sleeping off and on, not feeling any pain.  O: Filed Vitals:   03/26/13 1748 03/26/13 1824 03/26/13 1958 03/26/13 2340  BP: 120/77 114/75 121/62 113/70  Pulse: 91 98 86 95  Temp: 98.9 F (37.2 C) 98.7 F (37.1 C) 98.3 F (36.8 C)   TempSrc: Oral Oral Oral   Resp: 18 16 18    Height: 5\' 3"  (1.6 m)     Weight: 81.194 kg (179 lb)       FHT:  FHR: 130 bpm, variability: moderate,  accelerations:  Present,  decelerations:  Absent UC:   none SVE:   Dilation: 1 Effacement (%): 60 Station: -3 Exam by:: Carloyn Jaeger, CNM    A / P: PROM @ term  Fetal Wellbeing:  Category I Pain Control:  Labor support without medications  Anticipated MOD:  NSVD  Kenard Gower, MSN, CNM 03/27/2013, 1:53 AM

## 2013-03-27 NOTE — Op Note (Signed)
Cesarean Section Procedure Note  Indications: failure to progress: arrest of dilation  Pre-operative Diagnosis: 37 week 4 day pregnancy.  Post-operative Diagnosis: same  Surgeon: Lovenia Kim   Assistants: Renato Battles, CNM  Anesthesia: Epidural anesthesia and Local anesthesia 0.25.% bupivacaine  ASA Class: 2  Procedure Details  The patient was seen in the Holding Room. The risks, benefits, complications, treatment options, and expected outcomes were discussed with the patient.  The patient concurred with the proposed plan, giving informed consent. The risks of anesthesia, infection, bleeding and possible injury to other organs discussed. Injury to bowel, bladder, or ureter with possible need for repair discussed. Possible need for transfusion with secondary risks of hepatitis or HIV acquisition discussed. Post operative complications to include but not limited to DVT, PE and Pneumonia noted. The site of surgery properly noted/marked. The patient was taken to Operating Room # 9, identified as Inella Sitter and the procedure verified as C-Section Delivery. A Time Out was held and the above information confirmed.  After induction of anesthesia, the patient was draped and prepped in the usual sterile manner. A Pfannenstiel incision was made and carried down through the subcutaneous tissue to the fascia. Fascial incision was made and extended transversely using Mayo scissors. The fascia was separated from the underlying rectus tissue superiorly and inferiorly. The peritoneum was identified and entered. Peritoneal incision was extended longitudinally. The utero-vesical peritoneal reflection was incised transversely and the bladder flap was bluntly freed from the lower uterine segment. A low transverse uterine incision(Kerr hysterotomy) was made. Delivered from OT presentation was a  female with Apgar scores of 9 at one minute and 9 at five minutes. Bulb suctioning gently performed. Neonatal team in  attendance.After the umbilical cord was clamped and cut cord blood was obtained for evaluation. The placenta was removed intact and appeared normal. The uterus was curetted with a dry lap pack. Good hemostasis was noted.The uterine outline, tubes and ovaries appeared normal. The uterine incision was closed with running locked sutures of 0 Monocryl x 2 layers.  Bleeding at both angles of the incision required additional interrupted sutures on 0Monocryl. Uterus was then exteriorized for further observation of hemostasis due to persistent oozing from the right angle of the incision. No active bleeding noted. Surgicel place at both angles.  Hemostasis was observed. The fascia was then reapproximated with running sutures of 0 Monocryl. 2-0 plain to close the [place] layer.The skin was reapproximated with staples.  Instrument, sponge, and needle counts were correct prior the abdominal closure and at the conclusion of the case.   Findings: As above  Estimated Blood Loss:  600         Drains: foley                 Specimens: placenta                 Complications:  None; patient tolerated the procedure well.         Disposition: PACU - hemodynamically stable.         Condition: stable  Attending Attestation: I performed the procedure.

## 2013-03-28 ENCOUNTER — Encounter (HOSPITAL_COMMUNITY): Payer: Self-pay | Admitting: Obstetrics and Gynecology

## 2013-03-28 LAB — CBC
HCT: 27.1 % — ABNORMAL LOW (ref 36.0–46.0)
Hemoglobin: 9.4 g/dL — ABNORMAL LOW (ref 12.0–15.0)
MCH: 31 pg (ref 26.0–34.0)
MCHC: 34.7 g/dL (ref 30.0–36.0)
MCV: 89.4 fL (ref 78.0–100.0)
Platelets: 198 10*3/uL (ref 150–400)
RBC: 3.03 MIL/uL — ABNORMAL LOW (ref 3.87–5.11)
RDW: 13.6 % (ref 11.5–15.5)
WBC: 12.3 10*3/uL — ABNORMAL HIGH (ref 4.0–10.5)

## 2013-03-28 NOTE — Progress Notes (Signed)
Patient ID: Sonia [name], female   DOB: 18-Jun-1985, 27 y.o.   MRN: [id number] POD # 1  Subjective: Pt reports feeling tired, "sleepy"/ Pain controlled with ibuprofen and intraop meds. Tolerating po/ Foley patent and draining clear, yellow urine/ No n/v/Flatus neg Activity: up with assistance Bleeding is light Newborn info:  Information for the patient's newborn:  Starkman, Boy Leverne [[id number]]  female  / circ planned for tomorrow, per Dr Billy Coast Feeding: breast   Objective: VS: Blood pressure 96/60, pulse 65, temperature 97.9 F (36.6 C), temperature source Oral, resp. rate 18.    Intake/Output Summary (Last 24 hours) at 03/28/13 1038 Last data filed at 03/28/13 0630  Gross per 24 hour  Intake   2100 ml  Output   1325 ml  Net    775 ml      Recent Labs  03/26/13 1840 03/28/13 0610  WBC 9.5 12.3*  HGB 11.9* 9.4*  HCT 34.4* 27.1*  PLT 215 198    Blood type: A/Positive/-- (11/08 0000) Rubella: Immune (11/08 0000)    Physical Exam:  General: alert, cooperative and no distress CV: Regular rate and rhythm Resp: clear Abdomen: soft, nontender, bowel sounds faint and hypoactive Incision: covered with large occlusive dressing Uterine Fundus: firm, below umbilicus, nontender Lochia: minimal Ext: edema +1 and Homans sign is negative, no sign of DVT; SCD's in place.    A/P: POD # 1/ G1P1001 S/P C/Section d/t failure to progress, arrest of dilatation Mild ABL Anemia; start iron supplement tomorrow. Doing well Continue routine post op orders   Signed: Demetrius Revel, MSN, [facility] 03/28/2013, 10:38 AM

## 2013-03-28 NOTE — Anesthesia Postprocedure Evaluation (Signed)
Anesthesia Post Note  Patient: Sonia [name]  Procedure(s) Performed: Procedure(s) (LRB): CESAREAN SECTION (N/A)  Anesthesia type: Epidural  Patient location: PACU  Post pain: Pain level controlled  Post assessment: Post-op Vital signs reviewed  Last Vitals: BP 99/65  Pulse 66  Temp(Src) 36.6 C (Oral)  Resp 18  Ht 5\' 3"  (1.6 m)  Wt 179 lb (81.194 kg)  BMI 31.72 kg/m2  SpO2 98%  Post vital signs: Reviewed  Level of consciousness: sedated  Complications: No apparent anesthesia complications

## 2013-03-28 NOTE — Progress Notes (Signed)
Patient experiencing nausea, refused peri-care/orthostatic vitals in order to stand/ambulate.

## 2013-03-28 NOTE — Anesthesia Postprocedure Evaluation (Signed)
  Anesthesia Post-op Note  Patient: Sonia [name]  Procedure(s) Performed: Procedure(s): CESAREAN SECTION (N/A)  Patient Location: PACU and Mother/Baby  Anesthesia Type:Epidural  Level of Consciousness: awake, alert  and oriented  Airway and Oxygen Therapy: Patient Spontanous Breathing  Post-op Pain: mild  Post-op Assessment: Patient's Cardiovascular Status Stable, Respiratory Function Stable, No signs of Nausea or vomiting, Adequate PO intake and Pain level controlled  Post-op Vital Signs: stable  Complications: No apparent anesthesia complications

## 2013-03-29 MED ORDER — OXYCODONE-ACETAMINOPHEN 5-325 MG PO TABS: 1.0000 | ORAL_TABLET | ORAL | Status: DC | PRN

## 2013-03-29 MED ORDER — IBUPROFEN 600 MG PO TABS: 600.0000 mg | ORAL_TABLET | Freq: Four times a day (QID) | ORAL | Status: DC

## 2013-03-29 NOTE — Discharge Summary (Signed)
Obstetric Discharge Summary Reason for Admission: rupture of membranes and augmentation of labor Prenatal Procedures: ultrasound Intrapartum Procedures: cesarean: low cervical, transverse Postpartum Procedures: none Complications-Operative and Postpartum: ABL anemia Hemoglobin  Date Value Range Status  03/28/2013 9.4* 12.0 - 15.0 g/dL Final     DELTA CHECK NOTED     REPEATED TO VERIFY     HCT  Date Value Range Status  03/28/2013 27.1* 36.0 - 46.0 % Final    Physical Exam:  General: alert, cooperative and no distress Lochia: appropriate Uterine Fundus: firm Incision: Tegaderm and Honeycomb dressings intact - staple DVT Evaluation: No evidence of DVT seen on physical exam. Negative Homan's sign. No cords or calf tenderness. Calf/Ankle trace edema is present.  Discharge Diagnoses: Primary cesarean delivery for arrest of dilation  Discharge Information: Date: 03/29/2013 Activity: pelvic rest and no driving x 2 weeks Diet: routine Medications: PNV, Ibuprofen, Iron and Percocet Condition: stable Instructions: refer to practice specific booklet Discharge to: home Follow-up Information   Follow up with Lenoard Aden, MD. Schedule an appointment as soon as possible for a visit on 04/04/2013. (Also make an appointment for your 6 weeks postpartum visit)    Specialty:  Obstetrics and Gynecology   Contact information:   [address] [place] [address] [phone]       Newborn Data: Live born female on 03/27/2013 Birth Weight: 6 lb 4.9 oz (2860 g) APGAR: 9, 9  Home with mother.  Kenard Gower, MSN, CNM 03/29/2013, 10:03 AM

## 2013-03-29 NOTE — Progress Notes (Signed)
Patient ID: Sonia [name], female   DOB: May 13, 1986, 27 y.o.   MRN: [id number] Subjective: POD# 2 Information for the patient's newborn:  Sonia [name] [[id number]]  female  / circ done  Reports feeling well Feeding: breast Patient reports tolerating PO.  Breast symptoms: none Pain controlled with ibuprofen (OTC) and narcotic analgesics including Percocet Denies HA/SOB/C/P/N/V/dizziness. Flatus present, no BM. She reports vaginal bleeding as normal, without clots.  She is ambulating, urinating without difficult.     Objective:   VS:  Filed Vitals:   03/28/13 1432 03/28/13 1800 03/28/13 1820 03/29/13 0536  BP: 94/54 92/51 102/60 82/48  Pulse: 62 63 68 57  Temp: 98.9 F (37.2 C) 97.9 F (36.6 C) 98.3 F (36.8 C) 98.6 F (37 C)  TempSrc: Oral Oral Oral Oral  Resp: 18 20 18 18   Height:      Weight:      SpO2: 96% 98%       Recent Labs  03/26/13 1840 03/28/13 0610  WBC 9.5 12.3*  HGB 11.9* 9.4*  HCT 34.4* 27.1*  PLT 215 198     Blood type: A/Positive/-- (11/08 0000)  Rubella: Immune (11/08 0000)     Physical Exam:  General: alert, cooperative and no distress Abdomen: soft, nontender, normal bowel sounds Incision: Tegaderm and Honeycomb intact - staples Uterine Fundus: firm, 2FB below umbilicus, nontender Lochia: minimal Ext: edema trace and Homans sign is negative, no sign of DVT      Assessment/Plan: 27 y.o.   POD# 2.  s/p Cesarean Delivery.  Indications: failure to progress and arrest of dilation                Principal Problem:   Postpartum care following cesarean delivery (11/9) Active Problems:   Normal labor ABL Anemia on iron  Doing well, stable.               Routine post-op care Desires early discharge today  Sonia [name], M, MSN, CNM 03/29/2013, 9:55 AM

## 2013-05-03 NOTE — OR Nursing (Signed)
End time for case was 1905. CFNewnam,RN

## 2014-01-08 ENCOUNTER — Inpatient Hospital Stay (HOSPITAL_COMMUNITY): Payer: Medicaid Other

## 2014-01-08 ENCOUNTER — Encounter (HOSPITAL_COMMUNITY): Payer: Self-pay

## 2014-01-08 ENCOUNTER — Inpatient Hospital Stay (HOSPITAL_COMMUNITY)
Admission: AD | Admit: 2014-01-08 | Discharge: 2014-01-08 | Disposition: A | Discharge status: Home / Self Care | Payer: Medicaid Other | Source: Ambulatory Visit | Attending: Obstetrics & Gynecology | Admitting: Obstetrics & Gynecology

## 2014-01-08 DIAGNOSIS — O368390 Maternal care for abnormalities of the fetal heart rate or rhythm, unspecified trimester, not applicable or unspecified: Secondary | ICD-10-CM | POA: Diagnosis not present

## 2014-01-08 DIAGNOSIS — R1032 Left lower quadrant pain: Secondary | ICD-10-CM | POA: Insufficient documentation

## 2014-01-08 DIAGNOSIS — N83201 Unspecified ovarian cyst, right side: Secondary | ICD-10-CM

## 2014-01-08 DIAGNOSIS — K219 Gastro-esophageal reflux disease without esophagitis: Secondary | ICD-10-CM | POA: Insufficient documentation

## 2014-01-08 DIAGNOSIS — O209 Hemorrhage in early pregnancy, unspecified: Secondary | ICD-10-CM

## 2014-01-08 DIAGNOSIS — N83202 Unspecified ovarian cyst, left side: Secondary | ICD-10-CM

## 2014-01-08 LAB — CBC
HCT: 37.7 % (ref 36.0–46.0)
Hemoglobin: 12.5 g/dL (ref 12.0–15.0)
MCH: 28.9 pg (ref 26.0–34.0)
MCHC: 33.2 g/dL (ref 30.0–36.0)
MCV: 87.3 fL (ref 78.0–100.0)
Platelets: 247 10*3/uL (ref 150–400)
RBC: 4.32 MIL/uL (ref 3.87–5.11)
RDW: 12.9 % (ref 11.5–15.5)
WBC: 7.8 10*3/uL (ref 4.0–10.5)

## 2014-01-08 LAB — POCT PREGNANCY, URINE: Preg Test, Ur: POSITIVE — AB

## 2014-01-08 LAB — URINALYSIS, ROUTINE W REFLEX MICROSCOPIC
Bilirubin Urine: NEGATIVE
Glucose, UA: NEGATIVE mg/dL
Hgb urine dipstick: NEGATIVE
Ketones, ur: NEGATIVE mg/dL
Leukocytes, UA: NEGATIVE
Nitrite: NEGATIVE
Protein, ur: NEGATIVE mg/dL
Specific Gravity, Urine: 1.01 (ref 1.005–1.030)
Urobilinogen, UA: 0.2 mg/dL (ref 0.0–1.0)
pH: 6 (ref 5.0–8.0)

## 2014-01-08 LAB — WET PREP, GENITAL
Clue Cells Wet Prep HPF POC: NONE SEEN
Trich, Wet Prep: NONE SEEN
Yeast Wet Prep HPF POC: NONE SEEN

## 2014-01-08 LAB — HCG, QUANTITATIVE, PREGNANCY: hCG, Beta Chain, Quant, S: 10775 m[IU]/mL — ABNORMAL HIGH (ref <5)

## 2014-01-08 NOTE — MAU Provider Note (Signed)
History     CSN: [id number]  Arrival date and time: 01/08/14 1652   First Provider Initiated Contact with Patient 01/08/14 1753      Chief Complaint  Patient presents with  . Vaginal Bleeding   HPI Sonia [name] is a 28 y.o. G2P1001 at [redacted]w[redacted]d. She presents with c/o LLQ pain off/on x 1 wk, low cramping like with BM x 3 days. She started bleeding today- some bright, some dark - on tissue with wiping.  Awaiting insurance, plans PNC with [employer].    Past Medical History  Diagnosis Date  . Crohn's colitis   . GERD (gastroesophageal reflux disease)     no meds  . Headache(784.0)   . Normal labor 03/27/2013  . Postpartum care following cesarean delivery (11/9) 03/27/2013    Past Surgical History  Procedure Laterality Date  . Hand surgery      with nerve block  . Wisdom tooth extraction    . Laparoscopic endometriosis fulguration    . Cesarean section N/A 03/27/2013    Procedure: CESAREAN SECTION;  Surgeon: Lenoard Aden, MD;  Location: [facility];  Service: Obstetrics;  Laterality: N/A;    Family History  Problem Relation Age of Onset  . COPD Mother   . Heart disease Mother   . Hernia Mother   . Diverticulitis Mother   . Heart disease Maternal Grandmother   . Cancer Maternal Grandfather     History  Substance Use Topics  . Smoking status: Never Smoker   . Smokeless tobacco: Former Neurosurgeon    Types: Chew  . Alcohol Use: No     Comment: occasionally    Allergies: No Known Allergies  Prescriptions prior to admission  Medication Sig Dispense Refill  . cetirizine (ZYRTEC) 10 MG tablet Take 10 mg by mouth daily.      . diphenhydrAMINE (BENADRYL) 25 mg capsule Take 25 mg by mouth at bedtime as needed for allergies. For itching      . fluticasone (FLONASE) 50 MCG/ACT nasal spray Place 1 spray into both nostrils daily as needed for allergies or rhinitis.      . Prenatal Vit-Fe Fumarate-FA (PRENATAL MULTIVITAMIN) TABS tablet Take 1 tablet by mouth at bedtime.          Review of Systems  Gastrointestinal: Positive for abdominal pain.  Genitourinary:       Spotting   Physical Exam   Blood pressure 111/72, pulse 67, temperature 98.1 F (36.7 C), temperature source Oral, last menstrual period 11/26/2013, unknown if currently breastfeeding.  Physical Exam  Constitutional: She is oriented to person, place, and time. She appears well-developed and well-nourished.  GI: Soft. There is no tenderness.  Genitourinary:  Pelvic exam-  Ext gen- nl anatomy,skin intact Vagina- mucoid tan discharge Cx- parous Uterus- non tender, sl enlarged Adn- non tender, full ness bil, L>R  Musculoskeletal: Normal range of motion.  Neurological: She is alert and oriented to person, place, and time.  Skin: Skin is warm and dry.  Psychiatric: She has a normal mood and affect. Her behavior is normal.    MAU Course  Procedures  MDM Results for orders placed during the hospital encounter of 01/08/14 (from the past 24 hour(s))  URINALYSIS, ROUTINE W REFLEX MICROSCOPIC     Status: None   Collection Time    01/08/14  5:00 PM      Result Value Ref Range   Color, Urine YELLOW  YELLOW   APPearance CLEAR  CLEAR   Specific Gravity, Urine 1.010  1.005 - 1.030   pH 6.0  5.0 - 8.0   Glucose, UA NEGATIVE  NEGATIVE mg/dL   Hgb urine dipstick NEGATIVE  NEGATIVE   Bilirubin Urine NEGATIVE  NEGATIVE   Ketones, ur NEGATIVE  NEGATIVE mg/dL   Protein, ur NEGATIVE  NEGATIVE mg/dL   Urobilinogen, UA 0.2  0.0 - 1.0 mg/dL   Nitrite NEGATIVE  NEGATIVE   Leukocytes, UA NEGATIVE  NEGATIVE  POCT PREGNANCY, URINE     Status: Abnormal   Collection Time    01/08/14  5:05 PM      Result Value Ref Range   Preg Test, Ur POSITIVE (*) NEGATIVE  CBC     Status: None   Collection Time    01/08/14  5:50 PM      Result Value Ref Range   WBC 7.8  4.0 - 10.5 K/uL   RBC 4.32  3.87 - 5.11 MIL/uL   Hemoglobin 12.5  12.0 - 15.0 g/dL   HCT 16.1  09.6 - 04.5 %   MCV 87.3  78.0 - 100.0 fL   MCH  28.9  26.0 - 34.0 pg   MCHC 33.2  30.0 - 36.0 g/dL   RDW 40.9  81.1 - 91.4 %   Platelets 247  150 - 400 K/uL  HCG, QUANTITATIVE, PREGNANCY     Status: Abnormal   Collection Time    01/08/14  5:50 PM      Result Value Ref Range   hCG, Beta Chain, Quant, S 10775 (*) <5 mIU/mL  WET PREP, GENITAL     Status: Abnormal   Collection Time    01/08/14  6:35 PM      Result Value Ref Range   Yeast Wet Prep HPF POC NONE SEEN  NONE SEEN   Trich, Wet Prep NONE SEEN  NONE SEEN   Clue Cells Wet Prep HPF POC NONE SEEN  NONE SEEN   WBC, Wet Prep HPF POC MANY (*) NONE SEEN   US Ob Comp Less 14 Wks  01/08/2014   CLINICAL DATA:  Pelvic pain and vaginal spotting  EXAM: OBSTETRIC <14 WK [place] AND TRANSVAGINAL OB US  TECHNIQUE: Both transabdominal and transvaginal ultrasound examinations were performed for complete evaluation of the gestation as well as the maternal uterus, adnexal regions, and pelvic cul-de-sac. Transvaginal technique was performed to assess early pregnancy.  COMPARISON:  None.  FINDINGS: Intrauterine gestational sac: Visualized/normal in shape.  Yolk sac:  Visualized  Embryo:  Visualized  Cardiac Activity: Not visualized  CRL:   2  mm   5 w 6 d  Maternal uterus/adnexae: There is a moderate subchorionic hemorrhage measuring 2.2 x 0.7 x 1.2 cm. Cervical os is closed. Maternal uterus otherwise appears unremarkable.  In the right ovary, there is a cystic mass which contains debris measuring 2.6 x 1.7 x 1.9 cm. There is a complex primarily cystic ovarian mass on the left measuring 3.6 x 2.7 x 3.9 cm. There is a small amount of free pelvic fluid.  IMPRESSION: A fetal pole is seen within the uterus without convincing fetal heart activity. Findings are suspicious but not yet definitive for failed pregnancy. Recommend follow-up US in 10-14 days for definitive diagnosis. This recommendation follows SRU consensus guidelines: Diagnostic Criteria for Nonviable Pregnancy Early in the First Trimester. Malva Limes Med 2013;  782:9562-13.  There is a moderate subchorionic hemorrhage.  There are complex cystic adnexal masses bilaterally, larger on the left than on the right. These masses warrant further surveillance at  the time of follow-up ultrasound to assess for fetal viability in 10-14 days.   Electronically Signed   By: Bretta Bang M.D.   On: 01/08/2014 19:19   US Ob Transvaginal  01/08/2014   CLINICAL DATA:  Pelvic pain and vaginal spotting  EXAM: OBSTETRIC <14 WK [place] AND TRANSVAGINAL OB US  TECHNIQUE: Both transabdominal and transvaginal ultrasound examinations were performed for complete evaluation of the gestation as well as the maternal uterus, adnexal regions, and pelvic cul-de-sac. Transvaginal technique was performed to assess early pregnancy.  COMPARISON:  None.  FINDINGS: Intrauterine gestational sac: Visualized/normal in shape.  Yolk sac:  Visualized  Embryo:  Visualized  Cardiac Activity: Not visualized  CRL:   2  mm   5 w 6 d  Maternal uterus/adnexae: There is a moderate subchorionic hemorrhage measuring 2.2 x 0.7 x 1.2 cm. Cervical os is closed. Maternal uterus otherwise appears unremarkable.  In the right ovary, there is a cystic mass which contains debris measuring 2.6 x 1.7 x 1.9 cm. There is a complex primarily cystic ovarian mass on the left measuring 3.6 x 2.7 x 3.9 cm. There is a small amount of free pelvic fluid.  IMPRESSION: A fetal pole is seen within the uterus without convincing fetal heart activity. Findings are suspicious but not yet definitive for failed pregnancy. Recommend follow-up US in 10-14 days for definitive diagnosis. This recommendation follows SRU consensus guidelines: Diagnostic Criteria for Nonviable Pregnancy Early in the First Trimester. Malva Limes Med 2013; 161:0960-45.  There is a moderate subchorionic hemorrhage.  There are complex cystic adnexal masses bilaterally, larger on the left than on the right. These masses warrant further surveillance at the time of follow-up ultrasound  to assess for fetal viability in 10-14 days.   Electronically Signed   By: Bretta Bang M.D.   On: 01/08/2014 19:19     Assessment and Plan   Results reviewed with the pt and her husband 5 6/7 wks IUGS with embryo, no fetal heart rate- will repeat U/S in 10 d Bil cystic ovarian masses  Nirvi Boehler M. 01/08/2014, 6:44 PM

## 2014-01-08 NOTE — MAU Note (Signed)
Pt presents to MAU with complaints brownish bleeding today with some cramping. + HPT 2 weeks ago.

## 2014-01-10 LAB — GC/CHLAMYDIA PROBE AMP
CT Probe RNA: NEGATIVE
GC Probe RNA: NEGATIVE

## 2014-01-18 ENCOUNTER — Ambulatory Visit (HOSPITAL_COMMUNITY): Admission: RE | Admit: 2014-01-18 | Payer: Medicaid Other | Source: Ambulatory Visit

## 2014-03-20 ENCOUNTER — Encounter (HOSPITAL_COMMUNITY): Payer: Self-pay

## 2014-04-11 ENCOUNTER — Ambulatory Visit (INDEPENDENT_AMBULATORY_CARE_PROVIDER_SITE_OTHER): Payer: Medicaid Other | Admitting: Nurse Practitioner

## 2014-04-11 ENCOUNTER — Encounter: Payer: Self-pay | Admitting: Nurse Practitioner

## 2014-04-11 VITALS — BP 97/53 | HR 57 | Wt 149.0 lb

## 2014-04-11 DIAGNOSIS — G43009 Migraine without aura, not intractable, without status migrainosus: Secondary | ICD-10-CM

## 2014-04-11 MED ORDER — CYCLOBENZAPRINE HCL 10 MG PO TABS: 10.0000 mg | ORAL_TABLET | Freq: Every day | ORAL | Status: DC

## 2014-04-11 MED ORDER — SUMATRIPTAN SUCCINATE 100 MG PO TABS: 100.0000 mg | ORAL_TABLET | Freq: Once | ORAL | Status: DC | PRN

## 2014-04-11 NOTE — Patient Instructions (Signed)

## 2014-04-11 NOTE — Progress Notes (Signed)
Diagnosis: Migraine without Aura, Tension headache, Pregnancy 19 weeks 3 days  History: Sonia [name] 28 y.o. G2P1001 presents to [facility] for migraine consultation. She is currently having a headache of some sort every day. This can be either a migraine without aura or tension. She has been taking Fioricet but does not feel comfortable taking that every day with her pregnancy. Her headaches are worse in the morning and get better as the day goes along. Her sleep is very poor for at least the last year. She goes to bed around 10-11 pm and is up about 4 times at night with her 28 year old or to go to the bath room. She is up by 7 am each day. They are having some difficulty getting the 28 year old to sleep through the night. She also has allergies, denies snoring, and has some anxiety. Her marriage is good, she is not working and finances are more of a problem because she is not working.    Location: Bilateral temples and occassionally occipital  Number of Headache days/month: daily Severe:0 Moderate:10 Mild:20  Current Outpatient Prescriptions on File Prior to Visit  Medication Sig Dispense Refill  . cetirizine (ZYRTEC) 10 MG tablet Take 10 mg by mouth daily.    . diphenhydrAMINE (BENADRYL) 25 mg capsule Take 25 mg by mouth at bedtime as needed for allergies. For itching    . fluticasone (FLONASE) 50 MCG/ACT nasal spray Place 1 spray into both nostrils daily as needed for allergies or rhinitis.    . Prenatal Vit-Fe Fumarate-FA (PRENATAL MULTIVITAMIN) TABS tablet Take 1 tablet by mouth at bedtime.      No current facility-administered medications on file prior to visit.    Acute/ prevention: Fioricet  Past Medical History  Diagnosis Date  . Crohn's colitis   . GERD (gastroesophageal reflux disease)     no meds  . Headache(784.0)   . Normal labor 03/27/2013  . Postpartum care following cesarean delivery (11/9) 03/27/2013   Past Surgical History  Procedure Laterality Date   . Hand surgery      with nerve block  . Wisdom tooth extraction    . Laparoscopic endometriosis fulguration    . Cesarean section N/A 03/27/2013    Procedure: CESAREAN SECTION;  Surgeon: Lenoard Aden, MD;  Location: [facility];  Service: Obstetrics;  Laterality: N/A;   Family History  Problem Relation Age of Onset  . COPD Mother   . Heart disease Mother   . Hernia Mother   . Diverticulitis Mother   . Heart disease Maternal Grandmother   . Cancer Maternal Grandfather    Social History:  reports that she has never smoked. She has quit using smokeless tobacco. Her smokeless tobacco use included Chew. She reports that she does not drink alcohol or use illicit drugs. Allergies: No Known Allergies  Triggers: Lack sleep  Birth control: pregnant  ROS: Positive for migraine and tension headaches, anxiety, colitis, pregnancy  Exam: Well developed, well nourished caucasian female  General: NAD HEENT: Positive tenderness occipital regions Cardiac: RRR Lungs: Clear Neuro: Negative Skin: warm and dry  Impression:migraine - common, tension headache, pregnancy, anxiety  Plan: Discussed the pathophysiology of migraine and medication management in pregnancy. She understands the risks of medications in pregnancy. Her sleep seems to be the greatest trigger and we discussed going to bed earlier after warm bath, taking Flexeril at supper time and trying to eliminate the number of times she is getting out of bed at night.  Her focus is to break out of this daily headache pattern. She is asked to buy a cool air humidifier for her bed room. She will use Imitrex for moderate migraines. She will RTC 4 weeks.   Time Spent: 45 minutes

## 2014-05-09 ENCOUNTER — Encounter: Payer: Medicaid Other | Admitting: Nurse Practitioner

## 2014-07-06 ENCOUNTER — Other Ambulatory Visit: Payer: Self-pay | Admitting: Obstetrics and Gynecology

## 2014-08-30 DIAGNOSIS — O34219 Maternal care for unspecified type scar from previous cesarean delivery: Secondary | ICD-10-CM | POA: Diagnosis present

## 2014-08-30 DIAGNOSIS — K5090 Crohn's disease, unspecified, without complications: Secondary | ICD-10-CM | POA: Diagnosis present

## 2014-08-30 DIAGNOSIS — R51 Headache: Secondary | ICD-10-CM

## 2014-08-30 DIAGNOSIS — R519 Headache, unspecified: Secondary | ICD-10-CM | POA: Diagnosis not present

## 2014-08-30 NOTE — H&P (Addendum)
Sonia [name] is a 29 y.o. female, G2P1001 at 20 2/7 weeks, presenting for scheduled repeat C/S.  Denies leaking or bleeding, reports +FM.  Patient Active Problem List   Diagnosis Date Noted  . Crohn's disease 08/30/2014  . Previous cesarean delivery, antepartum condition or complication 03/49/1791  . Headache disorder 08/30/2014  . ANXIETY 06/18/2009  . GERD 05/23/2009    History of present pregnancy: Patient entered care at 11 2/7 weeks.   EDC of 09/02/14 was established by LMP and was congruent with [place] at 7 weeks.   Anatomy scan:  18 3/7 weeks, with normal findings and an posterior placenta. Probably endometrioma on left ovary.  Additional [place] evaluations:  None   Significant prenatal events:  Received flu vaccine and TDAP during pregnancy.  Initially desired VBAC, was counseled on increased risk of rupture with short interpregnancy interval, also had arrest of dilation at 9 cm with 1st delivery.  Prescribed Fioricet for HAs, referred to Neurologist.  URI sx at 31 weeks.  Decision to plan C/S, if no spontaneous labor by 40 weeks.  Treated for oral candidiasis at 32 weeks. Last evaluation:  08/28/14--cervix 1 cm, 50%, vtx, -2.  Scheduled for C/S on 09/04/14.  OB History    Gravida Para Term Preterm AB TAB SAB Ectopic Multiple Living   2 1 1  0 0 0 0 0 0 1    2014--Primary LTCS, 37 weeks, 30 hour labor, 6 + 4lbs, PROM, FTP at 9 cm, epidural, delivered by Dr. Ronita Hipps, LTCS incision, double layer closure.  Past Medical History  Diagnosis Date  . Crohn's colitis   . GERD (gastroesophageal reflux disease)     no meds  . Headache(784.0)   . Normal labor 03/27/2013  . Postpartum care following cesarean delivery (11/9) 03/27/2013   Past Surgical History  Procedure Laterality Date  . Hand surgery      with nerve block  . Wisdom tooth extraction    . Laparoscopic endometriosis fulguration    . Cesarean section N/A 03/27/2013    Procedure: CESAREAN SECTION;  Surgeon: Lovenia Kim, MD;   Location: [facility];  Service: Obstetrics;  Laterality: N/A;   Family History: family history includes COPD in her mother; Cancer in her maternal grandfather; Diverticulitis in her mother; Heart disease in her maternal grandmother and mother; Hernia in her mother.   Social History:  reports that she has never smoked. She has quit using smokeless tobacco. Her smokeless tobacco use included Chew. She reports that she does not drink alcohol or use illicit drugs.  Patient is Caucasian, of the [place], high-school educated, [employer], married to FOB Grayland Ormond).   Prenatal Transfer Tool  Maternal Diabetes: No Genetic Screening: Normal Maternal Ultrasounds/Referrals: Normal Fetal Ultrasounds or other Referrals:  None Maternal Substance Abuse:  No Significant Maternal Medications:  None Significant Maternal Lab Results: Lab values include: Group B Strep negative  TDAP 05/30/14 Flu 02/13/14  ROS:  Occasional contractions, +FM  No Known Allergies     Last menstrual period 11/26/2013, unknown if currently breastfeeding.  Chest clear Heart RRR without murmur Abd gravid, NT, FH 39 Pelvic: deferred for OR Ext: no calf tenderness B  FHR: Category 130 UCs:  occ  Prenatal labs: ABO, Rh:   Antibody:   Rubella:    RPR:   Immune HBsAg:    HIV:    GBS:  Negative 08/02/14 Sickle cell/Hgb electrophoresis:  NA Pap:  2014 WNL GC:  01/08/14 Negative Chlamydia:  01/08/14 Negative Genetic screenings:  1st trimester screen and AFP WNL Glucola:  WNL Other:   Hgb 12.2 at NOB, 11.4 at 28 weeks 100K Ecoli at NOB, repeat negative    Assessment/Plan: IUP at 40 2/7 weeks Previous cesarean birth, desires repeat Crohn's dx GBS negative  Plan: Admit to [facility] for scheduled repeat cesarean birth per consult with Dr. Charlesetta Garibaldi Routine pre-op orders   Allena Katz, [place] 08/30/2014, 4:36 PM    Date of Initial H&P: 4/13 by Donnel Saxon  History reviewed, patient examined, no change in status,  stable for surgery.

## 2014-09-01 ENCOUNTER — Encounter (HOSPITAL_COMMUNITY): Payer: Self-pay

## 2014-09-01 ENCOUNTER — Encounter (HOSPITAL_COMMUNITY)
Admission: RE | Admit: 2014-09-01 | Discharge: 2014-09-01 | Disposition: A | Discharge status: Home / Self Care | Payer: Medicaid Other | Source: Ambulatory Visit | Attending: Obstetrics and Gynecology | Admitting: Obstetrics and Gynecology

## 2014-09-01 DIAGNOSIS — Z01818 Encounter for other preprocedural examination: Secondary | ICD-10-CM | POA: Diagnosis not present

## 2014-09-01 LAB — TYPE AND SCREEN
ABO/RH(D): A POS
Antibody Screen: NEGATIVE

## 2014-09-01 LAB — CBC
HCT: 35.6 % — ABNORMAL LOW (ref 36.0–46.0)
Hemoglobin: 12.1 g/dL (ref 12.0–15.0)
MCH: 30.6 pg (ref 26.0–34.0)
MCHC: 34 g/dL (ref 30.0–36.0)
MCV: 90.1 fL (ref 78.0–100.0)
Platelets: 199 10*3/uL (ref 150–400)
RBC: 3.95 MIL/uL (ref 3.87–5.11)
RDW: 13 % (ref 11.5–15.5)
WBC: 8.5 10*3/uL (ref 4.0–10.5)

## 2014-09-01 LAB — RPR: RPR Ser Ql: NONREACTIVE

## 2014-09-01 LAB — ABO/RH: ABO/RH(D): A POS

## 2014-09-01 NOTE — Patient Instructions (Signed)
   Your procedure is scheduled on: April 18 at 1330 pm  Enter through the Main Entrance of [facility] at:1200  Pick up the phone at the desk and dial [phone] and inform us of your arrival.  Please call this number if you have any problems the morning of surgery: [phone]  Remember: Do not eat food after midnight: Sunday night  Do not drink clear liquids after: 9am day of surgery  Take these medicines the morning of surgery with a SIP OF WATER: Take zantac night before surgery  Do not wear jewelry, make-up,  No metal in your hair or on your body. Do not wear lotions, powders, perfumes.  You may wear deodorant.  Do not bring valuables to the hospital. Contacts, dentures or bridgework may not be worn into surgery.  Leave suitcase in the car. After Surgery it may be brought to your room. For patients being admitted to the hospital, checkout time is 11:00am the day of discharge.

## 2014-09-04 ENCOUNTER — Inpatient Hospital Stay (HOSPITAL_COMMUNITY)
Admission: RE | Admit: 2014-09-04 | Discharge: 2014-09-06 | DRG: 765 | Disposition: A | Discharge status: Home / Self Care | Payer: Medicaid Other | Source: Ambulatory Visit | Attending: Obstetrics and Gynecology | Admitting: Obstetrics and Gynecology

## 2014-09-04 ENCOUNTER — Inpatient Hospital Stay (HOSPITAL_COMMUNITY): Payer: Medicaid Other | Admitting: Registered Nurse

## 2014-09-04 ENCOUNTER — Encounter (HOSPITAL_COMMUNITY): Payer: Self-pay | Admitting: Anesthesiology

## 2014-09-04 ENCOUNTER — Encounter (HOSPITAL_COMMUNITY)
Admission: RE | Disposition: A | Discharge status: Home / Self Care | Payer: Self-pay | Source: Ambulatory Visit | Attending: Obstetrics and Gynecology

## 2014-09-04 DIAGNOSIS — O3421 Maternal care for scar from previous cesarean delivery: Principal | ICD-10-CM | POA: Diagnosis present

## 2014-09-04 DIAGNOSIS — O9989 Other specified diseases and conditions complicating pregnancy, childbirth and the puerperium: Secondary | ICD-10-CM | POA: Diagnosis present

## 2014-09-04 DIAGNOSIS — R51 Headache: Secondary | ICD-10-CM | POA: Diagnosis present

## 2014-09-04 DIAGNOSIS — K219 Gastro-esophageal reflux disease without esophagitis: Secondary | ICD-10-CM | POA: Diagnosis present

## 2014-09-04 DIAGNOSIS — D649 Anemia, unspecified: Secondary | ICD-10-CM | POA: Diagnosis not present

## 2014-09-04 DIAGNOSIS — O480 Post-term pregnancy: Secondary | ICD-10-CM | POA: Diagnosis present

## 2014-09-04 DIAGNOSIS — K5090 Crohn's disease, unspecified, without complications: Secondary | ICD-10-CM | POA: Diagnosis present

## 2014-09-04 DIAGNOSIS — N858 Other specified noninflammatory disorders of uterus: Secondary | ICD-10-CM | POA: Diagnosis present

## 2014-09-04 DIAGNOSIS — O9962 Diseases of the digestive system complicating childbirth: Secondary | ICD-10-CM | POA: Diagnosis present

## 2014-09-04 DIAGNOSIS — Z3A40 40 weeks gestation of pregnancy: Secondary | ICD-10-CM | POA: Diagnosis present

## 2014-09-04 DIAGNOSIS — O99344 Other mental disorders complicating childbirth: Secondary | ICD-10-CM | POA: Diagnosis present

## 2014-09-04 DIAGNOSIS — F419 Anxiety disorder, unspecified: Secondary | ICD-10-CM | POA: Diagnosis present

## 2014-09-04 DIAGNOSIS — O34219 Maternal care for unspecified type scar from previous cesarean delivery: Secondary | ICD-10-CM | POA: Diagnosis present

## 2014-09-04 DIAGNOSIS — R519 Headache, unspecified: Secondary | ICD-10-CM | POA: Diagnosis not present

## 2014-09-04 DIAGNOSIS — O9081 Anemia of the puerperium: Secondary | ICD-10-CM | POA: Diagnosis not present

## 2014-09-04 DIAGNOSIS — Z98891 History of uterine scar from previous surgery: Secondary | ICD-10-CM

## 2014-09-04 SURGERY — Surgical Case
Anesthesia: Epidural

## 2014-09-04 MED ORDER — MEASLES, MUMPS & RUBELLA VAC ~~LOC~~ INJ
0.5000 mL | INJECTION | Freq: Once | SUBCUTANEOUS | Status: DC
  Filled 2014-09-04: qty 0.5

## 2014-09-04 MED ORDER — DIPHENHYDRAMINE HCL 25 MG PO CAPS
25.0000 mg | ORAL_CAPSULE | ORAL | Status: DC | PRN
  Filled 2014-09-04: qty 1

## 2014-09-04 MED ORDER — LACTATED RINGERS IV SOLN
Freq: Once | INTRAVENOUS | Status: AC
  Administered 2014-09-04: 13:00:00 via INTRAVENOUS

## 2014-09-04 MED ORDER — NALBUPHINE HCL 10 MG/ML IJ SOLN
INTRAMUSCULAR | Status: AC
  Filled 2014-09-04: qty 1

## 2014-09-04 MED ORDER — ACETAMINOPHEN 325 MG PO TABS: 650.0000 mg | ORAL_TABLET | ORAL | Status: DC | PRN

## 2014-09-04 MED ORDER — PHENYLEPHRINE 8 MG IN D5W 100 ML (0.08MG/ML) PREMIX OPTIME
INJECTION | INTRAVENOUS | Status: DC | PRN
  Administered 2014-09-04: 60 ug/min via INTRAVENOUS

## 2014-09-04 MED ORDER — NALBUPHINE HCL 10 MG/ML IJ SOLN: 5.0000 mg | Freq: Once | INTRAMUSCULAR | Status: AC | PRN

## 2014-09-04 MED ORDER — FENTANYL CITRATE (PF) 100 MCG/2ML IJ SOLN
INTRAMUSCULAR | Status: AC
  Filled 2014-09-04: qty 2

## 2014-09-04 MED ORDER — ONDANSETRON HCL 4 MG/2ML IJ SOLN
INTRAMUSCULAR | Status: AC
  Filled 2014-09-04: qty 2

## 2014-09-04 MED ORDER — TETANUS-DIPHTH-ACELL PERTUSSIS 5-2.5-18.5 LF-MCG/0.5 IM SUSP: 0.5000 mL | Freq: Once | INTRAMUSCULAR | Status: DC

## 2014-09-04 MED ORDER — MEPERIDINE HCL 25 MG/ML IJ SOLN: 6.2500 mg | INTRAMUSCULAR | Status: DC | PRN

## 2014-09-04 MED ORDER — LORATADINE 10 MG PO TABS
10.0000 mg | ORAL_TABLET | Freq: Every day | ORAL | Status: DC
  Administered 2014-09-04 – 2014-09-05 (×2): 10 mg via ORAL
  Filled 2014-09-04 (×2): qty 1

## 2014-09-04 MED ORDER — SENNOSIDES-DOCUSATE SODIUM 8.6-50 MG PO TABS
2.0000 | ORAL_TABLET | ORAL | Status: DC
  Administered 2014-09-04 – 2014-09-05 (×2): 2 via ORAL
  Filled 2014-09-04 (×2): qty 2

## 2014-09-04 MED ORDER — SCOPOLAMINE 1 MG/3DAYS TD PT72
1.0000 | MEDICATED_PATCH | Freq: Once | TRANSDERMAL | Status: DC
Start: 2014-09-04 — End: 2014-09-04

## 2014-09-04 MED ORDER — OXYTOCIN 10 UNIT/ML IJ SOLN
INTRAMUSCULAR | Status: DC | PRN
  Administered 2014-09-04: 40 [IU] via INTRAMUSCULAR

## 2014-09-04 MED ORDER — KETOROLAC TROMETHAMINE 30 MG/ML IJ SOLN
30.0000 mg | Freq: Four times a day (QID) | INTRAMUSCULAR | Status: DC | PRN
  Administered 2014-09-04: 30 mg via INTRAMUSCULAR

## 2014-09-04 MED ORDER — KETOROLAC TROMETHAMINE 30 MG/ML IJ SOLN: 30.0000 mg | Freq: Four times a day (QID) | INTRAMUSCULAR | Status: AC | PRN

## 2014-09-04 MED ORDER — IBUPROFEN 600 MG PO TABS: 600.0000 mg | ORAL_TABLET | Freq: Four times a day (QID) | ORAL | Status: DC | PRN

## 2014-09-04 MED ORDER — IBUPROFEN 600 MG PO TABS
600.0000 mg | ORAL_TABLET | Freq: Four times a day (QID) | ORAL | Status: DC
  Administered 2014-09-04 – 2014-09-06 (×7): 600 mg via ORAL
  Filled 2014-09-04 (×7): qty 1

## 2014-09-04 MED ORDER — NALBUPHINE HCL 10 MG/ML IJ SOLN
5.0000 mg | INTRAMUSCULAR | Status: DC | PRN
  Administered 2014-09-05 (×2): 5 mg via INTRAVENOUS
  Filled 2014-09-04 (×2): qty 1

## 2014-09-04 MED ORDER — CEFAZOLIN SODIUM-DEXTROSE 2-3 GM-% IV SOLR
2.0000 g | INTRAVENOUS | Status: AC
  Administered 2014-09-04: 2 g via INTRAVENOUS

## 2014-09-04 MED ORDER — NALBUPHINE HCL 10 MG/ML IJ SOLN: 5.0000 mg | INTRAMUSCULAR | Status: DC | PRN

## 2014-09-04 MED ORDER — BISACODYL 10 MG RE SUPP: 10.0000 mg | Freq: Every day | RECTAL | Status: DC | PRN

## 2014-09-04 MED ORDER — ZOLPIDEM TARTRATE 5 MG PO TABS: 5.0000 mg | ORAL_TABLET | Freq: Every evening | ORAL | Status: DC | PRN

## 2014-09-04 MED ORDER — 0.9 % SODIUM CHLORIDE (POUR BTL) OPTIME
TOPICAL | Status: DC | PRN
  Administered 2014-09-04: 1000 mL

## 2014-09-04 MED ORDER — NALOXONE HCL 0.4 MG/ML IJ SOLN: 0.4000 mg | INTRAMUSCULAR | Status: DC | PRN

## 2014-09-04 MED ORDER — PHENYLEPHRINE 8 MG IN D5W 100 ML (0.08MG/ML) PREMIX OPTIME
INJECTION | INTRAVENOUS | Status: AC
  Filled 2014-09-04: qty 100

## 2014-09-04 MED ORDER — FENTANYL CITRATE (PF) 100 MCG/2ML IJ SOLN: 25.0000 ug | INTRAMUSCULAR | Status: DC | PRN

## 2014-09-04 MED ORDER — SIMETHICONE 80 MG PO CHEW
80.0000 mg | CHEWABLE_TABLET | Freq: Three times a day (TID) | ORAL | Status: DC
  Administered 2014-09-04 – 2014-09-06 (×5): 80 mg via ORAL
  Filled 2014-09-04 (×4): qty 1

## 2014-09-04 MED ORDER — NALBUPHINE HCL 10 MG/ML IJ SOLN
5.0000 mg | Freq: Once | INTRAMUSCULAR | Status: AC | PRN
  Administered 2014-09-04: 5 mg via SUBCUTANEOUS

## 2014-09-04 MED ORDER — SCOPOLAMINE 1 MG/3DAYS TD PT72: 1.0000 | MEDICATED_PATCH | Freq: Once | TRANSDERMAL | Status: DC

## 2014-09-04 MED ORDER — OXYCODONE-ACETAMINOPHEN 5-325 MG PO TABS
1.0000 | ORAL_TABLET | ORAL | Status: DC | PRN
  Administered 2014-09-06 (×2): 1 via ORAL
  Filled 2014-09-04 (×2): qty 1

## 2014-09-04 MED ORDER — SODIUM CHLORIDE 0.9 % IJ SOLN: 3.0000 mL | INTRAMUSCULAR | Status: DC | PRN

## 2014-09-04 MED ORDER — ONDANSETRON HCL 4 MG/2ML IJ SOLN
4.0000 mg | Freq: Three times a day (TID) | INTRAMUSCULAR | Status: DC | PRN
  Administered 2014-09-04: 4 mg via INTRAVENOUS
  Filled 2014-09-04: qty 2

## 2014-09-04 MED ORDER — BUPIVACAINE IN DEXTROSE 0.75-8.25 % IT SOLN
INTRATHECAL | Status: DC | PRN
  Administered 2014-09-04: 1.5 mL via INTRATHECAL

## 2014-09-04 MED ORDER — NALBUPHINE HCL 10 MG/ML IJ SOLN
INTRAMUSCULAR | Status: AC
  Administered 2014-09-05: 5 mg via INTRAVENOUS
  Filled 2014-09-04: qty 1

## 2014-09-04 MED ORDER — OXYTOCIN 40 UNITS IN LACTATED RINGERS INFUSION - SIMPLE MED: 62.5000 mL/h | INTRAVENOUS | Status: AC

## 2014-09-04 MED ORDER — EPHEDRINE SULFATE 50 MG/ML IJ SOLN
INTRAMUSCULAR | Status: DC | PRN
  Administered 2014-09-04 (×2): 10 mg via INTRAVENOUS

## 2014-09-04 MED ORDER — KETOROLAC TROMETHAMINE 30 MG/ML IJ SOLN
INTRAMUSCULAR | Status: AC
  Filled 2014-09-04: qty 1

## 2014-09-04 MED ORDER — SIMETHICONE 80 MG PO CHEW
80.0000 mg | CHEWABLE_TABLET | ORAL | Status: DC | PRN
  Administered 2014-09-05: 80 mg via ORAL
  Filled 2014-09-04: qty 1

## 2014-09-04 MED ORDER — ONDANSETRON HCL 4 MG/2ML IJ SOLN: 4.0000 mg | Freq: Three times a day (TID) | INTRAMUSCULAR | Status: DC | PRN

## 2014-09-04 MED ORDER — CEFAZOLIN SODIUM-DEXTROSE 2-3 GM-% IV SOLR
INTRAVENOUS | Status: AC
  Filled 2014-09-04: qty 50

## 2014-09-04 MED ORDER — MORPHINE SULFATE (PF) 0.5 MG/ML IJ SOLN
INTRAMUSCULAR | Status: DC | PRN
  Administered 2014-09-04: .15 mg via INTRATHECAL

## 2014-09-04 MED ORDER — OXYCODONE-ACETAMINOPHEN 5-325 MG PO TABS
2.0000 | ORAL_TABLET | ORAL | Status: DC | PRN
  Filled 2014-09-04: qty 2

## 2014-09-04 MED ORDER — DIPHENHYDRAMINE HCL 50 MG/ML IJ SOLN: 12.5000 mg | INTRAMUSCULAR | Status: DC | PRN

## 2014-09-04 MED ORDER — SIMETHICONE 80 MG PO CHEW
80.0000 mg | CHEWABLE_TABLET | ORAL | Status: DC
  Administered 2014-09-05: 80 mg via ORAL
  Filled 2014-09-04: qty 1

## 2014-09-04 MED ORDER — FENTANYL CITRATE (PF) 100 MCG/2ML IJ SOLN
INTRAMUSCULAR | Status: DC | PRN
  Administered 2014-09-04: 25 ug via INTRATHECAL

## 2014-09-04 MED ORDER — FLEET ENEMA 7-19 GM/118ML RE ENEM: 1.0000 | ENEMA | Freq: Every day | RECTAL | Status: DC | PRN

## 2014-09-04 MED ORDER — DEXTROSE 5 % IV SOLN
1.0000 ug/kg/h | INTRAVENOUS | Status: DC | PRN
  Filled 2014-09-04: qty 2

## 2014-09-04 MED ORDER — FERROUS SULFATE 325 (65 FE) MG PO TABS
325.0000 mg | ORAL_TABLET | Freq: Two times a day (BID) | ORAL | Status: DC
  Administered 2014-09-04 – 2014-09-06 (×4): 325 mg via ORAL
  Filled 2014-09-04 (×3): qty 1

## 2014-09-04 MED ORDER — MENTHOL 3 MG MT LOZG: 1.0000 | LOZENGE | OROMUCOSAL | Status: DC | PRN

## 2014-09-04 MED ORDER — EPHEDRINE 5 MG/ML INJ
INTRAVENOUS | Status: AC
  Filled 2014-09-04: qty 10

## 2014-09-04 MED ORDER — SCOPOLAMINE 1 MG/3DAYS TD PT72
MEDICATED_PATCH | TRANSDERMAL | Status: AC
  Administered 2014-09-04: 1.5 mg via TRANSDERMAL
  Filled 2014-09-04: qty 1

## 2014-09-04 MED ORDER — SCOPOLAMINE 1 MG/3DAYS TD PT72
1.0000 | MEDICATED_PATCH | Freq: Once | TRANSDERMAL | Status: DC
  Administered 2014-09-04: 1.5 mg via TRANSDERMAL

## 2014-09-04 MED ORDER — LACTATED RINGERS IV SOLN
INTRAVENOUS | Status: DC | PRN
  Administered 2014-09-04: 14:00:00 via INTRAVENOUS

## 2014-09-04 MED ORDER — NALOXONE HCL 1 MG/ML IJ SOLN: 1.0000 ug/kg/h | INTRAVENOUS | Status: DC | PRN

## 2014-09-04 MED ORDER — MORPHINE SULFATE 0.5 MG/ML IJ SOLN
INTRAMUSCULAR | Status: AC
  Filled 2014-09-04: qty 10

## 2014-09-04 MED ORDER — LACTATED RINGERS IV SOLN
INTRAVENOUS | Status: DC
  Administered 2014-09-04 (×2): via INTRAVENOUS

## 2014-09-04 MED ORDER — OXYTOCIN 10 UNIT/ML IJ SOLN
INTRAMUSCULAR | Status: AC
  Filled 2014-09-04: qty 4

## 2014-09-04 MED ORDER — PRENATAL MULTIVITAMIN CH
1.0000 | ORAL_TABLET | Freq: Every day | ORAL | Status: DC
  Administered 2014-09-05 – 2014-09-06 (×2): 1 via ORAL
  Filled 2014-09-04 (×2): qty 1

## 2014-09-04 MED ORDER — LACTATED RINGERS IV SOLN
INTRAVENOUS | Status: DC
  Administered 2014-09-04: 22:00:00 via INTRAVENOUS

## 2014-09-04 MED ORDER — ONDANSETRON HCL 4 MG/2ML IJ SOLN
INTRAMUSCULAR | Status: DC | PRN
  Administered 2014-09-04: 4 mg via INTRAVENOUS

## 2014-09-04 MED ORDER — DIBUCAINE 1 % RE OINT: 1.0000 "application " | TOPICAL_OINTMENT | RECTAL | Status: DC | PRN

## 2014-09-04 MED ORDER — LANOLIN HYDROUS EX OINT: 1.0000 "application " | TOPICAL_OINTMENT | CUTANEOUS | Status: DC | PRN

## 2014-09-04 MED ORDER — DIPHENHYDRAMINE HCL 25 MG PO CAPS: 25.0000 mg | ORAL_CAPSULE | Freq: Four times a day (QID) | ORAL | Status: DC | PRN

## 2014-09-04 MED ORDER — KETOROLAC TROMETHAMINE 30 MG/ML IJ SOLN: 30.0000 mg | Freq: Four times a day (QID) | INTRAMUSCULAR | Status: DC | PRN

## 2014-09-04 MED ORDER — KETOROLAC TROMETHAMINE 30 MG/ML IJ SOLN
30.0000 mg | Freq: Four times a day (QID) | INTRAMUSCULAR | Status: AC | PRN
Start: 2014-09-04 — End: 2014-09-05

## 2014-09-04 MED ORDER — LORATADINE 10 MG PO TABS
10.0000 mg | ORAL_TABLET | Freq: Every day | ORAL | Status: DC
Start: 2014-09-04 — End: 2014-09-04

## 2014-09-04 MED ORDER — DIPHENHYDRAMINE HCL 25 MG PO CAPS: 25.0000 mg | ORAL_CAPSULE | ORAL | Status: DC | PRN

## 2014-09-04 MED ORDER — WITCH HAZEL-GLYCERIN EX PADS: 1.0000 "application " | MEDICATED_PAD | CUTANEOUS | Status: DC | PRN

## 2014-09-04 SURGICAL SUPPLY — 34 items
BENZOIN TINCTURE PRP APPL 2/3 (GAUZE/BANDAGES/DRESSINGS) ×2 IMPLANT
CLAMP CORD UMBIL (MISCELLANEOUS) IMPLANT
CLOTH BEACON ORANGE TIMEOUT ST (SAFETY) ×2 IMPLANT
CONTAINER PREFILL 10% NBF 15ML (MISCELLANEOUS) IMPLANT
DRAIN JACKSON PRT FLT 10 (DRAIN) IMPLANT
DRAPE SHEET LG 3/4 BI-LAMINATE (DRAPES) IMPLANT
DRSG OPSITE POSTOP 4X10 (GAUZE/BANDAGES/DRESSINGS) ×2 IMPLANT
DURAPREP 26ML APPLICATOR (WOUND CARE) ×2 IMPLANT
ELECT REM PT RETURN 9FT ADLT (ELECTROSURGICAL) ×2
ELECTRODE REM PT RTRN 9FT ADLT (ELECTROSURGICAL) ×1 IMPLANT
EVACUATOR SILICONE 100CC (DRAIN) IMPLANT
EXTRACTOR VACUUM M CUP 4 TUBE (SUCTIONS) IMPLANT
GLOVE BIO SURGEON STRL SZ 6.5 (GLOVE) ×2 IMPLANT
GLOVE BIOGEL PI IND STRL 7.0 (GLOVE) ×1 IMPLANT
GLOVE BIOGEL PI INDICATOR 7.0 (GLOVE) ×1
GOWN STRL REUS W/TWL LRG LVL3 (GOWN DISPOSABLE) ×4 IMPLANT
HEMOSTAT SURGICEL 4X8 (HEMOSTASIS) ×2 IMPLANT
KIT ABG SYR 3ML LUER SLIP (SYRINGE) IMPLANT
NEEDLE HYPO 25X5/8 SAFETYGLIDE (NEEDLE) IMPLANT
NS IRRIG 1000ML POUR BTL (IV SOLUTION) ×2 IMPLANT
PACK C SECTION WH (CUSTOM PROCEDURE TRAY) ×2 IMPLANT
PAD OB MATERNITY 4.3X12.25 (PERSONAL CARE ITEMS) ×2 IMPLANT
RTRCTR C-SECT PINK 25CM LRG (MISCELLANEOUS) ×2 IMPLANT
STRIP CLOSURE SKIN 1/2X4 (GAUZE/BANDAGES/DRESSINGS) ×2 IMPLANT
SUT CHROMIC 0 CT 1 (SUTURE) ×2 IMPLANT
SUT MNCRL AB 3-0 PS2 27 (SUTURE) ×2 IMPLANT
SUT PLAIN 2 0 (SUTURE) ×2
SUT PLAIN 2 0 XLH (SUTURE) ×2 IMPLANT
SUT PLAIN ABS 2-0 CT1 27XMFL (SUTURE) ×2 IMPLANT
SUT SILK 2 0 SH (SUTURE) IMPLANT
SUT VIC AB 0 CTX 36 (SUTURE) ×7
SUT VIC AB 0 CTX36XBRD ANBCTRL (SUTURE) ×7 IMPLANT
TOWEL OR 17X24 6PK STRL BLUE (TOWEL DISPOSABLE) ×2 IMPLANT
TRAY FOLEY CATH SILVER 14FR (SET/KITS/TRAYS/PACK) ×2 IMPLANT

## 2014-09-04 NOTE — Anesthesia Procedure Notes (Signed)
Spinal Patient location during procedure: OR Start time: 09/04/2014 1:16 PM Staffing Anesthesiologist: Mal Amabile Performed by: anesthesiologist  Preanesthetic Checklist Completed: patient identified, site marked, surgical consent, pre-op evaluation, timeout performed, IV checked, risks and benefits discussed and monitors and equipment checked Spinal Block Patient position: sitting Prep: site prepped and draped and DuraPrep Patient monitoring: heart rate, cardiac monitor, continuous pulse ox and blood pressure Approach: midline Location: L3-4 Injection technique: single-shot Needle Needle type: Sprotte  Needle gauge: 24 G Needle length: 9 cm Needle insertion depth: 5 cm Assessment Sensory level: T4 Additional Notes Patient tolerated procedure well. Adequate sensory level.

## 2014-09-04 NOTE — Lactation Note (Signed)
This note was copied from the chart of Sonia Maurianna Degrasse. Lactation Consultation Note  P2, Ex BF 11 months. Mother states she knows how to hand express and states bf going well. Baby has BFx4 and 2 voids in 7 hours of life. Reviewed supply and demand, cluster feeding and answered questions about introduction of breast milk in a bottle later. Mom encouraged to feed baby 8-12 times/24 hours and with feeding cues.  Mom made aware of O/P services, breastfeeding support groups, community resources, and our phone # for post-discharge questions.    Patient Name: Sonia [name] [id number] Date: 09/04/2014 Reason for consult: Initial assessment   Maternal Data Has patient been taught Hand Expression?: Yes Does the patient have breastfeeding experience prior to this delivery?: Yes  Feeding Feeding Type: Breast Fed Length of feed: 25 min  LATCH Score/Interventions                      Lactation Tools Discussed/Used     Consult Status Consult Status: Follow-up Date: 09/05/14 Follow-up type: In-patient    Sonia [name] [facility] 09/04/2014, 9:48 PM

## 2014-09-04 NOTE — Transfer of Care (Signed)
Immediate Anesthesia Transfer of Care Note  Patient: Sonia [name]  Procedure(s) Performed: Procedure(s): REPEAT CESAREAN SECTION (N/A)  Patient Location: PACU  Anesthesia Type:Spinal  Level of Consciousness: awake, alert  and oriented  Airway & Oxygen Therapy: Patient Spontanous Breathing  Post-op Assessment: Report given to RN  Post vital signs: Reviewed  Last Vitals:  Filed Vitals:   09/04/14 1215  BP: 108/66  Pulse: 74  Temp: 36.8 C  Resp: 20    Complications: No apparent anesthesia complications

## 2014-09-04 NOTE — Addendum Note (Signed)
Addendum  created 09/04/14 1729 by Mal Amabile, MD   Modules edited: Orders, PRL Based Order Sets

## 2014-09-04 NOTE — Consult Note (Signed)
Neonatology Note:   Attendance at C-section:    I was asked by Dr. Charlesetta Garibaldi to attend this repeat C/S at term. The mother is a G2P1 A pos, GBS neg with an uncomplicated pregnancy. She has Crohn's disease. ROM at delivery, fluid clear. Vacuum-assisted delivery. Infant vigorous with good spontaneous cry and tone. Needed only minimal bulb suctioning. Ap 9/9. Lungs clear to ausc in DR. To CN to care of Pediatrician.   Real Cons, MD

## 2014-09-04 NOTE — Op Note (Signed)
Cesarean Section Procedure Note   Sonia [name]  09/04/2014  Indications: Scheduled Proceedure/Maternal Request   Pre-operative Diagnosis: Previous Cesarean Section, Post Dates.   Post-operative Diagnosis: Same   Surgeon: Surgeon(s) and Role:    * Crawford Givens, MD - Primary   Assistants: Donnel Saxon CNM   Anesthesia: spinal   Procedure Details:  The patient was seen in the Holding Room. The risks, benefits, complications, treatment options, and expected outcomes were discussed with the patient. The patient concurred with the proposed plan, giving informed consent. identified as Sonia [name] and the procedure verified as C-Section Delivery. A Time Out was held and the above information confirmed.  After induction of anesthesia, the patient was draped and prepped in the usual sterile manner. A transverse incision was made and carried down through the subcutaneous tissue to the fascia. Fascial incision was made in the midline and extended transversely. The fascia was separated from the underlying rectus muscle superiorly and inferiorly. The peritoneum was identified and entered. Peritoneal incision was extended longitudinally with good visualization of bowel and bladder. The utero-vesical peritoneal reflection was incised transversely and the bladder flap was bluntly freed from the lower uterine segment.  An alexsis retractor was placed in the abdomen.   A low transverse uterine incision was made. Delivered from cephalic presentation was a  infant, with Apgar scores of 9 at one minute and 9 at five minutes. A hand held vacuum was used to assist with delivery of the head.  The pressure was in the green zone.  Only one pull was needed.  No pop offs.   Cord ph was not sent the umbilical cord was clamped and cut cord blood was obtained for evaluation. The placenta was removed Intact and appeared normal. The uterine outline, tubes and ovaries appeared normal}. The uterine incision was closed  with running locked sutures of 0Vicryl. A second layer 0 vicrlyl was used to imbricate the uterine incision.  A figure of 8 o vicryl    Hemostasis was observed. Lavage was carried out until clear. The alexsis was removed.  The peritoneum was closed with 0 chromic.  The muscles were examined and any bleeders were made hemostatic using bovie cautery device.   The fascia was then reapproximated with running sutures of 0 vicryl.  The subcutaneous tissue was reapproximated  With interrupted stitches using 2-0 plain gut. The subcuticular closure was performed using 3-3moocryl     Instrument, sponge, and needle counts were correct prior the abdominal closure and were correct at the conclusion of the case.    Findings: infant was delivered from vtx presentation. The fluid was clear.  The uterus tubes and ovaries appeared normal.     Estimated Blood Loss: 9098m  Total IV Fluids: 32004m Urine Output: 50CC OF clear urine  Specimens: none  Complications: no complications  Disposition: PACU - hemodynamically stable.   Maternal Condition: stable   Baby condition / location:  Couplet care / Skin to Skin  Attending Attestation: I performed the procedure.   Signed: Surgeon(s): NaiCrawford GivensD

## 2014-09-04 NOTE — Anesthesia Postprocedure Evaluation (Signed)
  Anesthesia Post-op Note  Patient: Sonia [name]  Procedure(s) Performed: Procedure(s): REPEAT CESAREAN SECTION (N/A)  Patient Location: PACU  Anesthesia Type:Spinal  Level of Consciousness: awake, alert  and oriented  Airway and Oxygen Therapy: Patient Spontanous Breathing  Post-op Pain: none  Post-op Assessment: Post-op Vital signs reviewed, Patient's Cardiovascular Status Stable, Respiratory Function Stable, Patent Airway, No signs of Nausea or vomiting and Pain level controlled  Post-op Vital Signs: Reviewed and stable  Last Vitals:  Filed Vitals:   09/04/14 1500  BP: 98/51  Pulse: 85  Temp:   Resp: 16    Complications: No apparent anesthesia complications

## 2014-09-04 NOTE — Anesthesia Preprocedure Evaluation (Addendum)
Anesthesia Evaluation  Patient identified by MRN, date of birth, ID band Patient awake    Reviewed: Allergy & Precautions, Patient's Chart, lab work & pertinent test results  Airway Mallampati: I  TM Distance: >3 FB Neck ROM: Full    Dental no notable dental hx. (+) Teeth Intact   Pulmonary neg pulmonary ROS,  breath sounds clear to auscultation  Pulmonary exam normal       Cardiovascular negative cardio ROS  Rhythm:Regular Rate:Normal     Neuro/Psych  Headaches, Anxiety    GI/Hepatic GERD-  Medicated and Controlled,Crohn's Disease   Endo/Other  Obesity  Renal/GU   negative genitourinary   Musculoskeletal negative musculoskeletal ROS (+)   Abdominal (+) + obese,   Peds  Hematology negative hematology ROS (+)   Anesthesia Other Findings   Reproductive/Obstetrics (+) Pregnancy                            Anesthesia Physical Anesthesia Plan  ASA: II  Anesthesia Plan: Epidural   Post-op Pain Management:    Induction:   Airway Management Planned: Natural Airway  Additional Equipment:   Intra-op Plan:   Post-operative Plan:   Informed Consent: I have reviewed the patients History and Physical, chart, labs and discussed the procedure including the risks, benefits and alternatives for the proposed anesthesia with the patient or authorized representative who has indicated his/her understanding and acceptance.     Plan Discussed with: Anesthesiologist  Anesthesia Plan Comments:        Anesthesia Quick Evaluation

## 2014-09-05 ENCOUNTER — Encounter (HOSPITAL_COMMUNITY): Payer: Self-pay | Admitting: Obstetrics and Gynecology

## 2014-09-05 LAB — CBC
HCT: 29.4 % — ABNORMAL LOW (ref 36.0–46.0)
Hemoglobin: 9.9 g/dL — ABNORMAL LOW (ref 12.0–15.0)
MCH: 30.4 pg (ref 26.0–34.0)
MCHC: 33.7 g/dL (ref 30.0–36.0)
MCV: 90.2 fL (ref 78.0–100.0)
Platelets: 137 10*3/uL — ABNORMAL LOW (ref 150–400)
RBC: 3.26 MIL/uL — ABNORMAL LOW (ref 3.87–5.11)
RDW: 13 % (ref 11.5–15.5)
WBC: 7.2 10*3/uL (ref 4.0–10.5)

## 2014-09-05 LAB — BIRTH TISSUE RECOVERY COLLECTION (PLACENTA DONATION)

## 2014-09-05 NOTE — Progress Notes (Signed)
UR chart review completed.  

## 2014-09-05 NOTE — Lactation Note (Signed)
This note was copied from the chart of Sonia [name]. Lactation Consultation Note  Baby latched easily.  Sucks and some swallows observed. Mother nipples tender and has comfort gels.  Encouraged mother to bring baby to her and place pillows underneath. Reviewed breast massage, applying ebm, engorgement care and monitoring voids and stools. Mother asked when she could start giving baby a bottle of pumped breastmilk. Suggest waiting until 63-89 weeks old and using a slow flow nipple. Discharge bf teaching complete.  Patient Name: Sonia [name] [id number] Date: 09/05/2014 Reason for consult: Follow-up assessment   Maternal Data    Feeding Feeding Type: Breast Fed  LATCH Score/Interventions Latch: Grasps breast easily, tongue down, lips flanged, rhythmical sucking.  Audible Swallowing: A few with stimulation Intervention(s): Skin to skin Intervention(s): Alternate breast massage  Type of Nipple: Everted at rest and after stimulation  Comfort (Breast/Nipple): Filling, red/small blisters or bruises, mild/mod discomfort  Problem noted: Mild/Moderate discomfort Interventions (Mild/moderate discomfort): Comfort gels;Hand expression  Hold (Positioning): No assistance needed to correctly position infant at breast.  LATCH Score: 8  Lactation Tools Discussed/Used     Consult Status Consult Status: Complete    Hardie Pulley 09/05/2014, 4:23 PM

## 2014-09-05 NOTE — Addendum Note (Signed)
Addendum  created 09/05/14 9485 by Earmon Phoenix, CRNA   Modules edited: Notes Section   Notes Section:  File: [id number]

## 2014-09-05 NOTE — Anesthesia Postprocedure Evaluation (Signed)
  Anesthesia Post-op Note  Patient: Sonia [name]  Procedure(s) Performed: Procedure(s): REPEAT CESAREAN SECTION (N/A)  Patient Location: Mother/Baby  Anesthesia Type:Spinal  Level of Consciousness: awake, alert , oriented and patient cooperative  Airway and Oxygen Therapy: Patient Spontanous Breathing  Post-op Pain: mild  Post-op Assessment: Patient's Cardiovascular Status Stable and Respiratory Function Stable  Post-op Vital Signs: stable  Last Vitals:  Filed Vitals:   09/05/14 0500  BP: 94/56  Pulse: 59  Temp: 36.4 C  Resp: 16    Complications: No apparent anesthesia complications

## 2014-09-05 NOTE — Progress Notes (Addendum)
Subjective: Postpartum Day 1: repeat Cesarean Delivery with vacuum assiat Patient up ad lib, reports no syncope or dizziness. Feeding:  breast Contraceptive plan:  Unsure Out pt circ  Objective: Vital signs in last 24 hours: Temp:  [97.4 F (36.3 C)-98.2 F (36.8 C)] 97.6 F (36.4 C) (04/19 0500) Pulse Rate:  [51-109] 59 (04/19 0500) Resp:  [14-20] 16 (04/19 0500) BP: (94-108)/(43-66) 94/56 mmHg (04/19 0500) SpO2:  [97 %-100 %] 97 % (04/19 0500) Weight:  [176 lb (79.833 kg)] 176 lb (79.833 kg) (04/18 1627)  Physical Exam:  General: alert and cooperative Lochia: appropriate Uterine Fundus: firm Abdomen:  + bowel sounds, non distended Incision: healing well  Honeycomb dressing no active drainage DVT Evaluation: No evidence of DVT seen on physical exam. Homan's sign: Negative   Recent Labs  09/05/14 0602  HGB 9.9*  HCT 29.4*  WBC 7.2    Assessment: Status post Cesarean section day 1. Doing well postoperatively.  Honeycomb dressing in place, no significant active drainage Anemia - hemodynamicly stable.    Plan: Continue current care. Breastfeeding and Lactation consult Change honeycone dressing    Sonia [name], CNM, MSN 09/05/2014. 7:54 AM

## 2014-09-06 MED ORDER — OXYCODONE-ACETAMINOPHEN 5-325 MG PO TABS: 1.0000 | ORAL_TABLET | ORAL | Status: DC | PRN

## 2014-09-06 MED ORDER — IBUPROFEN 600 MG PO TABS: 600.0000 mg | ORAL_TABLET | Freq: Four times a day (QID) | ORAL | Status: DC | PRN

## 2014-09-06 NOTE — Discharge Summary (Signed)
Cesarean Section Delivery Discharge Summary  Sonia [name]  DOB:    Aug 09, 1985 MRN:    [id number] CSN:    [id number]  Date of admission:                  09/04/14  Date of discharge:                   09/06/14  Procedures this admission:  Repeat LTCS  Date of Delivery: 09/04/14  Newborn Data:  Live born female  Birth Weight: 7 lb 8.6 oz (3420 g) APGAR: 9, 9  Home with mother.  Circumcision Plan: Outpatient  History of Present Illness:  Sonia [name] is a 29 y.o. female, G2P2002, who presents at [redacted]w[redacted]d weeks gestation. The patient has been followed at [facility] division of [facility]   Her pregnancy has been complicated by:  Patient Active Problem List   Diagnosis Date Noted  . S/P repeat low transverse C-section 09/04/2014  . Crohn's disease 08/30/2014  . Previous cesarean delivery, antepartum condition or complication 08/30/2014  . Headache disorder 08/30/2014  . ANXIETY 06/18/2009  . GERD 05/23/2009     Hospital Course--Scheduled Cesarean: Patient was admitted on 09/04/14 for a scheduled repeat cesarean delivery.   She was taken to the operating room, where Dr. Normand Sloop performed a repeat LTCS under spinal anesthesia, with delivery of a viable female, with weight and Apgars as listed below. Infant was in good condition and remained at the patient's bedside.  The patient was taken to recovery in good condition.  Patient planned to breast feed.  On post-op day 1, patient was doing well, tolerating a regular diet, with Hgb of 9.9.  Throughout her stay, her physical exam was WNL, her incision was CDI, and her vital signs remained stable.  By post-op day 2, she was up ad lib, tolerating a regular diet, with good pain control with po med.  She was deemed to have received the full benefit of her hospital stay, and was discharged home in stable condition.  Contraceptive choice was undecided at  present.  Feeding:  breast  Contraception:  Undecided  Hemoglobin Results:  CBC Latest Ref Rng 09/05/2014 09/01/2014 01/08/2014  WBC 4.0 - 10.5 K/uL 7.2 8.5 7.8  Hemoglobin 12.0 - 15.0 g/dL 7.8(G) 95.6 21.3  Hematocrit 36.0 - 46.0 % 29.4(L) 35.6(L) 37.7  Platelets 150 - 400 K/uL 137(L) 199 247     Discharge Physical Exam:   General: alert Lochia: appropriate Uterine Fundus: firm Abdomen:  + bowel sounds Incision: Dressing CDI DVT Evaluation: No evidence of DVT seen on physical exam. Negative Homan's sign.  Intrapartum Procedures: cesarean: low cervical, transverse Postpartum Procedures: none Complications-Operative and Postpartum: none  Discharge Diagnoses: Term Pregnancy-delivered and previous cesarean, repeat LTCS, mild anemia  Discharge Information:  Activity:           pelvic rest Diet:                routine Medications: Ibuprofen and Percocet Condition:      stable Instructions:  Discharge to: home  Follow-up Information    Follow up with [facility]. Schedule an appointment as soon as possible for a visit in 6 weeks.   Specialty:  Obstetrics and Gynecology   Why:  Call for any questions or concerns. Call office to schedule circumcision.   Contact information:   [address] [place] [address] [phone]  Nigel Bridgeman CNM 09/06/2014 11:53 AM

## 2014-09-06 NOTE — Lactation Note (Signed)
This note was copied from the chart of Sonia [name]. Lactation Consultation Note  Mom reports that BF is going well but that she has a blister on her left nipple.  Baby was sound asleep on her chest so I gave her verbal tips on how to improve baby's latch.  She has a history of BF x 11 months and plans to call us with any concerns. Reminded of support groups and outpatient services.  Patient Name: Sonia [name] YIFOY'D Date: 09/06/2014     Maternal Data    Feeding Feeding Type: Breast Fed Length of feed: 15 min  LATCH Score/Interventions                      Lactation Tools Discussed/Used     Consult Status      Soyla Dryer 09/06/2014, 8:40 AM

## 2014-09-06 NOTE — Discharge Instructions (Signed)
Postpartum Care After Cesarean Delivery After you deliver your newborn (postpartum period), the usual stay in the hospital is 24-72 hours. If there were problems with your labor or delivery, or if you have other medical problems, you might be in the hospital longer.  While you are in the hospital, you will receive help and instructions on how to care for yourself and your newborn during the postpartum period.  While you are in the hospital:  It is normal for you to have pain or discomfort from the incision in your abdomen. Be sure to tell your nurses when you are having pain, where the pain is located, and what makes the pain worse.  If you are breastfeeding, you may feel uncomfortable contractions of your uterus for a couple of weeks. This is normal. The contractions help your uterus get back to normal size.  It is normal to have some bleeding after delivery.  For the first 1-3 days after delivery, the flow is red and the amount may be similar to a period.  It is common for the flow to start and stop.  In the first few days, you may pass some small clots. Let your nurses know if you begin to pass large clots or your flow increases.  Do not  flush blood clots down the toilet before having the nurse look at them.  During the next 3-10 days after delivery, your flow should become more watery and pink or brown-tinged in color.  Ten to fourteen days after delivery, your flow should be a small amount of yellowish-white discharge.  The amount of your flow will decrease over the first few weeks after delivery. Your flow may stop in 6-8 weeks. Most women have had their flow stop by 12 weeks after delivery.  You should change your sanitary pads frequently.  Wash your hands thoroughly with soap and water for at least 20 seconds after changing pads, using the toilet, or before holding or feeding your newborn.  Your intravenous (IV) tubing will be removed when you are drinking enough fluids.  The  urine drainage tube (urinary catheter) that was inserted before delivery may be removed within 6-8 hours after delivery or when feeling returns to your legs. You should feel like you need to empty your bladder within the first 6-8 hours after the catheter has been removed.  In case you become weak, lightheaded, or faint, call your nurse before you get out of bed for the first time and before you take a shower for the first time.  Within the first few days after delivery, your breasts may begin to feel tender and full. This is called engorgement. Breast tenderness usually goes away within 48-72 hours after engorgement occurs. You may also notice milk leaking from your breasts. If you are not breastfeeding, do not stimulate your breasts. Breast stimulation can make your breasts produce more milk.  Spending as much time as possible with your newborn is very important. During this time, you and your newborn can feel close and get to know each other. Having your newborn stay in your room (rooming in) will help to strengthen the bond with your newborn. It will give you time to get to know your newborn and become comfortable caring for your newborn.  Your hormones change after delivery. Sometimes the hormone changes can temporarily cause you to feel sad or tearful. These feelings should not last more than a few days. If these feelings last longer than that, you should talk to your  caregiver.  If desired, talk to your caregiver about methods of family planning or contraception.  Talk to your caregiver about immunizations. Your caregiver may want you to have the following immunizations before leaving the hospital:  Tetanus, diphtheria, and pertussis (Tdap) or tetanus and diphtheria (Td) immunization. It is very important that you and your family (including grandparents) or others caring for your newborn are up-to-date with the Tdap or Td immunizations. The Tdap or Td immunization can help protect your newborn  from getting ill.  Rubella immunization.  Varicella (chickenpox) immunization.  Influenza immunization. You should receive this annual immunization if you did not receive the immunization during your pregnancy. Document Released: 01/28/2012 Document Reviewed: 01/28/2012 [facility] Patient Information 2015 [place], [place]. This information is not intended to replace advice given to you by your health care provider. Make sure you discuss any questions you have with your health care provider.  Iron Deficiency Anemia Anemia is when you have a low number of healthy red blood cells. It is often caused by too little iron. This is called iron deficiency anemia. It may make you tired and short of breath. HOME CARE   Take iron as told by your doctor.  Take vitamins as told by your doctor.  Eat foods that have iron in them. This includes liver, lean beef, whole-grain bread, eggs, dried fruit, and dark green leafy vegetables. GET HELP RIGHT AWAY IF:  You pass out (faint).  You have chest pain.  You feel sick to your stomach (nauseous) or throw up (vomit).  You get very short of breath with activity.  You are weak.  You have a fast heartbeat.  You start to sweat for no reason.  You become light-headed when getting up from a chair or bed. MAKE SURE YOU:  Understand these instructions.  Will watch your condition.  Will get help right away if you are not doing well or get worse. Document Released: 06/07/2010 Document Revised: 05/10/2013 Document Reviewed: 01/10/2013 [facility] Patient Information 2015 [place], [place]. This information is not intended to replace advice given to you by your health care provider. Make sure you discuss any questions you have with your health care provider.

## 2015-03-20 ENCOUNTER — Encounter: Payer: Self-pay | Admitting: Gastroenterology

## 2015-04-03 ENCOUNTER — Ambulatory Visit: Payer: Medicaid Other | Admitting: Gastroenterology

## 2015-05-25 ENCOUNTER — Ambulatory Visit: Payer: Medicaid Other | Admitting: Gastroenterology

## 2015-07-25 ENCOUNTER — Ambulatory Visit: Payer: Medicaid Other | Admitting: Gastroenterology

## 2015-08-22 ENCOUNTER — Encounter: Payer: Self-pay | Admitting: Gastroenterology

## 2015-08-22 ENCOUNTER — Ambulatory Visit (INDEPENDENT_AMBULATORY_CARE_PROVIDER_SITE_OTHER): Payer: BLUE CROSS/BLUE SHIELD | Admitting: Gastroenterology

## 2015-08-22 ENCOUNTER — Other Ambulatory Visit (INDEPENDENT_AMBULATORY_CARE_PROVIDER_SITE_OTHER): Payer: BLUE CROSS/BLUE SHIELD

## 2015-08-22 VITALS — BP 90/60 | HR 62 | Ht 62.0 in | Wt 137.0 lb

## 2015-08-22 DIAGNOSIS — K50111 Crohn's disease of large intestine with rectal bleeding: Secondary | ICD-10-CM

## 2015-08-22 DIAGNOSIS — K921 Melena: Secondary | ICD-10-CM | POA: Diagnosis not present

## 2015-08-22 DIAGNOSIS — R1012 Left upper quadrant pain: Secondary | ICD-10-CM

## 2015-08-22 LAB — CBC WITH DIFFERENTIAL/PLATELET
Basophils Absolute: 0 10*3/uL (ref 0.0–0.1)
Basophils Relative: 0.8 % (ref 0.0–3.0)
Eosinophils Absolute: 0.1 10*3/uL (ref 0.0–0.7)
Eosinophils Relative: 2.8 % (ref 0.0–5.0)
HCT: 37.9 % (ref 36.0–46.0)
Hemoglobin: 12.8 g/dL (ref 12.0–15.0)
Lymphocytes Relative: 28.8 % (ref 12.0–46.0)
Lymphs Abs: 1.5 10*3/uL (ref 0.7–4.0)
MCHC: 33.8 g/dL (ref 30.0–36.0)
MCV: 84.4 fl (ref 78.0–100.0)
Monocytes Absolute: 0.4 10*3/uL (ref 0.1–1.0)
Monocytes Relative: 7.2 % (ref 3.0–12.0)
Neutro Abs: 3.2 10*3/uL (ref 1.4–7.7)
Neutrophils Relative %: 60.4 % (ref 43.0–77.0)
Platelets: 273 10*3/uL (ref 150.0–400.0)
RBC: 4.49 Mil/uL (ref 3.87–5.11)
RDW: 13.1 % (ref 11.5–15.5)
WBC: 5.2 10*3/uL (ref 4.0–10.5)

## 2015-08-22 LAB — HEPATIC FUNCTION PANEL
ALT: 23 U/L (ref 0–35)
AST: 17 U/L (ref 0–37)
Albumin: 4.5 g/dL (ref 3.5–5.2)
Alkaline Phosphatase: 27 U/L — ABNORMAL LOW (ref 39–117)
Bilirubin, Direct: 0.1 mg/dL (ref 0.0–0.3)
Total Bilirubin: 0.7 mg/dL (ref 0.2–1.2)
Total Protein: 6.7 g/dL (ref 6.0–8.3)

## 2015-08-22 LAB — TSH: TSH: 0.64 u[IU]/mL (ref 0.35–4.50)

## 2015-08-22 LAB — BASIC METABOLIC PANEL
BUN: 14 mg/dL (ref 6–23)
CO2: 32 mEq/L (ref 19–32)
Calcium: 9.6 mg/dL (ref 8.4–10.5)
Chloride: 104 mEq/L (ref 96–112)
Creatinine, Ser: 0.74 mg/dL (ref 0.40–1.20)
GFR: 98.21 mL/min (ref >60.00)
Glucose, Bld: 91 mg/dL (ref 70–99)
Potassium: 4.1 mEq/L (ref 3.5–5.1)
Sodium: 141 mEq/L (ref 135–145)

## 2015-08-22 LAB — HIGH SENSITIVITY CRP: CRP, High Sensitivity: 1.25 mg/L (ref 0.000–5.000)

## 2015-08-22 LAB — SEDIMENTATION RATE: Sed Rate: 10 mm/hr (ref 0–22)

## 2015-08-22 MED ORDER — MESALAMINE 1.2 G PO TBEC: 2.4000 g | DELAYED_RELEASE_TABLET | Freq: Every day | ORAL | Status: DC

## 2015-08-22 MED ORDER — NA SULFATE-K SULFATE-MG SULF 17.5-3.13-1.6 GM/177ML PO SOLN: 1.0000 | Freq: Once | ORAL | Status: DC

## 2015-08-22 NOTE — Progress Notes (Signed)
    History of Present Illness: This is a 30 year old female presenting for evaluation of left upper quadrant pain and rectal bleeding. She has a history of Crohn's colitis and has not returned for follow-up since 2012. Colonoscopy in 2008 revealed mild inflammatory changes in segmental areas of the colon and erosions in the terminal ileum. Terminal ileal biopsies were unremarkable. Colon biopsies showed chronic colitis with granulomas consistent with Crohn's. She states she had very few GI complaints except for reflux during pregnancy for the past several years. Over the past few months she has had frequent left upper quadrant pain that is brought on by spicy foods and tomato-based products. She has particular problems after pizza. When she limits her diet these symptoms have not occurred. She has had worsening problems with hard, pellet-like stools and right red blood per rectum with bowel movements on a regular basis for about 6 weeks. She has lost weight gradually and intentionally following her pregnancy one year ago. Denies diarrhea, change in stool caliber, melena, nausea, vomiting, dysphagia, reflux symptoms, chest pain.  Review of Systems: Pertinent positive and negative review of systems were noted in the above HPI section. All other review of systems were otherwise negative.  Current Medications, Allergies, Past Medical History, Past Surgical History, Family History and Social History were reviewed in [employer] record.  Physical Exam: General: Well developed, well nourished, no acute distress Head: Normocephalic and atraumatic Eyes:  sclerae anicteric, EOMI Ears: Normal auditory acuity Mouth: No deformity or lesions Neck: Supple, no masses or thyromegaly Lungs: Clear throughout to auscultation Heart: Regular rate and rhythm; no murmurs, rubs or bruits Abdomen: Soft, non tender and non distended. No masses, hepatosplenomegaly or hernias noted. Normal Bowel  sounds Rectal: No lesions, no tenderness, no stool in the vault. Hemoccult-negative mucus in the vault Musculoskeletal: Symmetrical with no gross deformities  Skin: No lesions on visible extremities Pulses:  Normal pulses noted Extremities: No clubbing, cyanosis, edema or deformities noted Neurological: Alert oriented x 4, grossly nonfocal Cervical Nodes:  No significant cervical adenopathy Inguinal Nodes: No significant inguinal adenopathy Psychological:  Alert and cooperative. Normal mood and affect  Assessment and Recommendations:  1. Left upper quadrant pain is well correlated with tomato-based products and spicy foods. This appears to be a food intolerance.   2. Hematochezia, constipation and history of Crohn's colitis. Obtain CMP, CBC, TSH, ESR and CRP. Begin Lialda 2.4 g daily and Preparation H suppositories daily. Begin Benefiber daily and increase dietary fiber and water intake. Schedule colonoscopy. The risks (including bleeding, perforation, infection, missed lesions, medication reactions and possible hospitalization or surgery if complications occur), benefits, and alternatives to colonoscopy with possible biopsy and possible polypectomy were discussed with the patient and they consent to proceed.   3. GERD, pregnancy related. Currently asymptomatic.

## 2015-08-22 NOTE — Patient Instructions (Addendum)
Your physician has requested that you go to the basement for lab work before leaving today.  We have sent the following medications to your pharmacy for you to pick up at your convenience:Lialda  Start over the counter Bene-fiber daily and preperation H suppositories daily.  You have been given a High Fiber diet.   You have been scheduled for an abdominal ultrasound at [facility] (1st floor of hospital) on 08/24/15 at 8:30am. Please arrive 15 minutes prior to your appointment for registration. Make certain not to have anything to eat or drink 6 hours prior to your appointment. Should you need to reschedule your appointment, please contact radiology at [phone]. This test typically takes about 30 minutes to perform.  You have been scheduled for a colonoscopy. Please follow written instructions given to you at your visit today.  Please pick up your prep supplies at the pharmacy within the next 1-3 days. If you use inhalers (even only as needed), please bring them with you on the day of your procedure. Your physician has requested that you go to www.startemmi.com and enter the access code given to you at your visit today. This web site gives a general overview about your procedure. However, you should still follow specific instructions given to you by our office regarding your preparation for the procedure.  Thank you for choosing me and [facility].  Venita Lick. Pleas Koch., MD., Clementeen Graham

## 2015-08-24 ENCOUNTER — Ambulatory Visit (HOSPITAL_COMMUNITY)
Admission: RE | Admit: 2015-08-24 | Discharge: 2015-08-24 | Disposition: A | Discharge status: Home / Self Care | Payer: BLUE CROSS/BLUE SHIELD | Source: Ambulatory Visit | Attending: Gastroenterology | Admitting: Gastroenterology

## 2015-08-24 DIAGNOSIS — R1012 Left upper quadrant pain: Secondary | ICD-10-CM | POA: Diagnosis present

## 2015-08-30 ENCOUNTER — Encounter: Payer: Self-pay | Admitting: Gastroenterology

## 2015-08-30 ENCOUNTER — Ambulatory Visit (AMBULATORY_SURGERY_CENTER): Payer: BLUE CROSS/BLUE SHIELD | Admitting: Gastroenterology

## 2015-08-30 VITALS — BP 102/67 | HR 61 | Temp 98.9°F | Resp 13 | Ht 62.0 in | Wt 137.0 lb

## 2015-08-30 DIAGNOSIS — K921 Melena: Secondary | ICD-10-CM

## 2015-08-30 DIAGNOSIS — K50811 Crohn's disease of both small and large intestine with rectal bleeding: Secondary | ICD-10-CM

## 2015-08-30 DIAGNOSIS — K5190 Ulcerative colitis, unspecified, without complications: Secondary | ICD-10-CM | POA: Diagnosis not present

## 2015-08-30 MED ORDER — SODIUM CHLORIDE 0.9 % IV SOLN: 500.0000 mL | INTRAVENOUS | Status: DC

## 2015-08-30 NOTE — Op Note (Signed)
[facility] Patient Name: Sonia [name] Procedure Date: 08/30/2015 9:01 AM MRN: [id number] Endoscopist: [name] , MD Age: 30 Date of Birth: 1985-08-14 Gender: Female Procedure:                Colonoscopy Indications:              Hematochezia, Crohn's disease Medicines:                Monitored Anesthesia Care Procedure:                Pre-Anesthesia Assessment:                           - Prior to the procedure, a History and Physical                            was performed, and patient medications and                            allergies were reviewed. The patient's tolerance of                            previous anesthesia was also reviewed. The risks                            and benefits of the procedure and the sedation                            options and risks were discussed with the patient.                            All questions were answered, and informed consent                            was obtained. Prior Anticoagulants: The patient has                            taken no previous anticoagulant or antiplatelet                            agents. ASA Grade Assessment: II - A patient with                            mild systemic disease. After reviewing the risks                            and benefits, the patient was deemed in                            satisfactory condition to undergo the procedure.                           After obtaining informed consent, the colonoscope  was passed under direct vision. Throughout the                            procedure, the patient's blood pressure, pulse, and                            oxygen saturations were monitored continuously. The                            Model PCF-H190L [id number]) scope was introduced                            through the anus and advanced to the the terminal                            ileum. The colonoscopy was performed without           difficulty. The patient tolerated the procedure                            well. The quality of the bowel preparation was                            excellent. The terminal ileum, ileocecal valve,                            appendiceal orifice, and rectum were photographed. Scope In: 9:11:58 AM Scope Out: 9:27:10 AM Scope Withdrawal Time: 0 hours 12 minutes 58 seconds  Total Procedure Duration: 0 hours 15 minutes 12 seconds  Findings:                 The digital rectal exam was normal.                           The terminal ileum contained a few localized                            non-bleeding erosions. Biopsies were taken with a                            cold forceps for histology.                           A few localized non-bleeding erosions were found at                            the ileocecal valve. No stigmata of recent bleeding                            were seen. Biopsies were taken with a cold forceps                            for histology.  An area of mildly congested mucosa was found in the                            descending colon. Biopsies were taken with a cold                            forceps for histology.                           A localized area of granular mucosa was found in                            the recto-sigmoid colon. Biopsies were taken with a                            cold forceps for histology.                           The exam was otherwise without abnormality.                           Small Grade I internal hemorrhoids noted on                            retroflexion. No additional abnormalities were                            found on retroflexion. Complications:            No immediate complications. Estimated Blood Loss:     Estimated blood loss was minimal. Impression:               - A few erosions in the terminal ileum. Biopsied.                           - A few erosions at the ileocecal valve.  Biopsied.                           - Congested mucosa in the descending colon.                            Biopsied.                           - Granularity in the recto-sigmoid colon. Biopsied.                           - Small Grade I internal hemorrhoids. Recommendation:           - Patient has a contact number available for                            emergencies. The signs and symptoms of potential                            delayed complications were discussed  with the                            patient. Return to normal activities tomorrow.                            Written discharge instructions were provided to the                            patient.                           - Resume previous diet.                           - Continue present medications.                           - Await pathology results.                           - GI office appointment in 1 month.                           - Repeat colonoscopy is recommended for                            surveillance. The colonoscopy date will be                            determined after pathology results from today's                            exam become available for review. [name], MD 08/30/2015 9:44:32 AM This report has been signed electronically.

## 2015-08-30 NOTE — Progress Notes (Signed)
To recovery, report to Tyrell, RN, VSS. 

## 2015-08-30 NOTE — Patient Instructions (Signed)
Make an appointment with Dr. Fuller Plan for one month follow up. [phone].    YOU HAD AN ENDOSCOPIC PROCEDURE TODAY AT [facility]:   Refer to the procedure report that was given to you for any specific questions about what was found during the examination.  If the procedure report does not answer your questions, please call your gastroenterologist to clarify.  If you requested that your care partner not be given the details of your procedure findings, then the procedure report has been included in a sealed envelope for you to review at your convenience later.  YOU SHOULD EXPECT: Some feelings of bloating in the abdomen. Passage of more gas than usual.  Walking can help get rid of the air that was put into your GI tract during the procedure and reduce the bloating. If you had a lower endoscopy (such as a colonoscopy or flexible sigmoidoscopy) you may notice spotting of blood in your stool or on the toilet paper. If you underwent a bowel prep for your procedure, you may not have a normal bowel movement for a few days.  Please Note:  You might notice some irritation and congestion in your nose or some drainage.  This is from the oxygen used during your procedure.  There is no need for concern and it should clear up in a day or so.  SYMPTOMS TO REPORT IMMEDIATELY:   Following lower endoscopy (colonoscopy or flexible sigmoidoscopy):  Excessive amounts of blood in the stool  Significant tenderness or worsening of abdominal pains  Swelling of the abdomen that is new, acute  Fever of 100F or higher   For urgent or emergent issues, a gastroenterologist can be reached at any hour by calling [phone].   DIET: Your first meal following the procedure should be a small meal and then it is ok to progress to your normal diet. Heavy or fried foods are harder to digest and may make you feel nauseous or bloated.  Likewise, meals heavy in dairy and vegetables can increase bloating.  Drink  plenty of fluids but you should avoid alcoholic beverages for 24 hours.  ACTIVITY:  You should plan to take it easy for the rest of today and you should NOT DRIVE or use heavy machinery until tomorrow (because of the sedation medicines used during the test).    FOLLOW UP: Our staff will call the number listed on your records the next business day following your procedure to check on you and address any questions or concerns that you may have regarding the information given to you following your procedure. If we do not reach you, we will leave a message.  However, if you are feeling well and you are not experiencing any problems, there is no need to return our call.  We will assume that you have returned to your regular daily activities without incident.  If any biopsies were taken you will be contacted by phone or by letter within the next 1-3 weeks.  Please call us at [phone] if you have not heard about the biopsies in 3 weeks.    SIGNATURES/CONFIDENTIALITY: You and/or your care partner have signed paperwork which will be entered into your electronic medical record.  These signatures attest to the fact that that the information above on your After Visit Summary has been reviewed and is understood.  Full responsibility of the confidentiality of this discharge information lies with you and/or your care-partner.

## 2015-08-30 NOTE — Progress Notes (Signed)
Called to room to assist during endoscopic procedure.  Patient ID and intended procedure confirmed with present staff. Received instructions for my participation in the procedure from the performing physician.  

## 2015-09-03 ENCOUNTER — Telehealth: Payer: Self-pay | Admitting: *Deleted

## 2015-09-03 ENCOUNTER — Ambulatory Visit: Payer: Medicaid Other | Admitting: Physician Assistant

## 2015-09-03 NOTE — Telephone Encounter (Signed)
Message left

## 2015-09-10 ENCOUNTER — Encounter: Payer: Self-pay | Admitting: Gastroenterology

## 2015-09-17 ENCOUNTER — Ambulatory Visit: Payer: Medicaid Other | Admitting: Gastroenterology

## 2018-01-15 ENCOUNTER — Encounter: Payer: Self-pay | Admitting: Family Medicine

## 2018-01-15 ENCOUNTER — Ambulatory Visit: Payer: BLUE CROSS/BLUE SHIELD | Admitting: Family Medicine

## 2018-01-15 VITALS — BP 120/64 | HR 81 | Temp 98.2°F | Ht 62.5 in | Wt 134.2 lb

## 2018-01-15 DIAGNOSIS — F419 Anxiety disorder, unspecified: Secondary | ICD-10-CM

## 2018-01-15 DIAGNOSIS — R002 Palpitations: Secondary | ICD-10-CM | POA: Diagnosis not present

## 2018-01-15 DIAGNOSIS — Z13220 Encounter for screening for lipoid disorders: Secondary | ICD-10-CM

## 2018-01-15 DIAGNOSIS — Z0000 Encounter for general adult medical examination without abnormal findings: Secondary | ICD-10-CM | POA: Diagnosis not present

## 2018-01-15 DIAGNOSIS — K50111 Crohn's disease of large intestine with rectal bleeding: Secondary | ICD-10-CM

## 2018-01-15 NOTE — Progress Notes (Signed)
Sonia [name] DOB: 08/12/1985 Encounter date: 01/15/2018  This is a 32 y.o. female who presents to establish care. Chief Complaint  Patient presents with  . Establish Care    no new concerns, physical if we have time    History of present illness:  No specific concerns. Just starting working at [employer].   Sees [facility] for Crohns (last big flare was 2008). Had a flare a couple of years ago. Stays away from spicy foods, triggers. Not taking medications since usually she isn't bothered by symptoms. Stopped drinking alcohol. Has had upper and lower scopes through GI.   UTIs - gets them occasionally. Notes more vaginal dryness which she thinks affects sexual activity.       Past Medical History:  Diagnosis Date  . Crohn's colitis ([place])   . GERD (gastroesophageal reflux disease)    no meds  . Headache(784.0)   . Normal labor 03/27/2013  . Postpartum care following cesarean delivery (11/9) 03/27/2013   Past Surgical History:  Procedure Laterality Date  . CESAREAN SECTION N/A 03/27/2013   Procedure: CESAREAN SECTION;  Surgeon: Lovenia Kim, MD;  Location: [facility];  Service: Obstetrics;  Laterality: N/A;  . CESAREAN SECTION N/A 09/04/2014   Procedure: REPEAT CESAREAN SECTION;  Surgeon: Crawford Givens, MD;  Location: [facility];  Service: Obstetrics;  Laterality: N/A;  . HAND SURGERY  2012   with nerve block  . LAPAROSCOPIC ENDOMETRIOSIS FULGURATION    . WISDOM TOOTH EXTRACTION     No Known Allergies Current Meds  Medication Sig  . cetirizine (ZYRTEC) 10 MG tablet Take 10 mg by mouth daily.  . diphenhydrAMINE (BENADRYL) 25 mg capsule Take 25 mg by mouth at bedtime as needed for allergies. For itching  . fluticasone (FLONASE) 50 MCG/ACT nasal spray Place 1 spray into both nostrils daily as needed for allergies or rhinitis.  Marland Kitchen ibuprofen (ADVIL,MOTRIN) 600 MG tablet Take 1 tablet (600 mg total) by mouth every 6 (six) hours as needed.  . mesalamine (LIALDA) 1.2 g EC  tablet Take 2 tablets (2.4 g total) by mouth daily with breakfast.   Social History   Tobacco Use  . Smoking status: Never Smoker  . Smokeless tobacco: Former Systems developer    Types: Chew  Substance Use Topics  . Alcohol use: No    Comment: occasionally   Family History  Problem Relation Age of Onset  . COPD Mother   . Heart disease Mother        smoker  . Hernia Mother   . Diverticulitis Mother   . Kidney disease Mother   . Heart failure Mother   . Stroke Mother 79  . Osteoporosis Mother   . Heart disease Maternal Grandmother        smoker  . Arthritis-Osteo Maternal Grandmother   . Cancer Maternal Grandfather        throat  . Alcohol abuse Maternal Grandfather   . Other Father        pituitary gland tumor  . Colon cancer Neg Hx      Review of Systems  Constitutional: Negative for activity change, appetite change, chills, fatigue, fever and unexpected weight change.  HENT: Negative for congestion, ear pain, hearing loss, sinus pressure, sinus pain, sore throat and trouble swallowing.   Eyes: Negative for pain and visual disturbance.  Respiratory: Negative for cough, chest tightness, shortness of breath and wheezing.   Cardiovascular: Positive for palpitations (come only when stressed/anxious. Tends to be associated with family  interactions that are more irritating to her.). Negative for chest pain and leg swelling.  Gastrointestinal: Negative for abdominal pain, blood in stool, constipation, diarrhea, nausea and vomiting.  Genitourinary: Negative for difficulty urinating and menstrual problem.  Musculoskeletal: Negative for arthralgias and back pain.  Skin: Negative for rash.  Neurological: Negative for dizziness, weakness, numbness and headaches.  Hematological: Negative for adenopathy. Does not bruise/bleed easily.  Psychiatric/Behavioral: Negative for sleep disturbance and suicidal ideas. The patient is nervous/anxious (intermittent; feels related to family stress but feels  well controlled.).     Objective:  BP 120/64 (BP Location: Left Arm, Patient Position: Sitting, Cuff Size: Normal)   Pulse 81   Temp 98.2 F (36.8 C) (Oral)   Ht 5' 2.5" (1.588 m)   Wt 134 lb 3.2 oz (60.9 kg)   SpO2 100%   BMI 24.15 kg/m   Weight: 134 lb 3.2 oz (60.9 kg)   BP Readings from Last 3 Encounters:  01/15/18 120/64  08/30/15 102/67  08/22/15 90/60   Wt Readings from Last 3 Encounters:  01/15/18 134 lb 3.2 oz (60.9 kg)  08/30/15 137 lb (62.1 kg)  08/22/15 137 lb (62.1 kg)    Physical Exam  Constitutional: She is oriented to person, place, and time. She appears well-developed and well-nourished. No distress.  HENT:  Head: Normocephalic and atraumatic.  Right Ear: External ear normal.  Left Ear: External ear normal.  Mouth/Throat: Oropharynx is clear and moist. No oropharyngeal exudate.  Eyes: Pupils are equal, round, and reactive to light. Conjunctivae are normal.  Neck: Normal range of motion. Neck supple. No thyromegaly present.  Cardiovascular: Normal rate, regular rhythm and normal heart sounds. Exam reveals no gallop and no friction rub.  No murmur heard. Pulmonary/Chest: Effort normal and breath sounds normal.  Abdominal: Soft. Bowel sounds are normal. She exhibits no distension and no mass. There is no tenderness. There is no guarding. No hernia.  Musculoskeletal: Normal range of motion. She exhibits no edema, tenderness or deformity.  Lymphadenopathy:    She has no cervical adenopathy.  Neurological: She is alert and oriented to person, place, and time. She has normal strength. She displays normal reflexes.  Reflex Scores:      Tricep reflexes are 2+ on the right side and 2+ on the left side.      Bicep reflexes are 2+ on the right side and 2+ on the left side.      Brachioradialis reflexes are 2+ on the right side and 2+ on the left side.      Patellar reflexes are 2+ on the right side and 2+ on the left side. Skin: Skin is warm and dry. No rash noted.   Psychiatric: She has a normal mood and affect. Her speech is normal and behavior is normal. Thought content normal.    Assessment/Plan:  1. Preventative health care Keep up with healthy lifestyle.  2. Crohn's disease of large intestine with rectal bleeding ([place]) Stable; follows with GI. Controlled with diet at this point.   3. Anxiety Stable; good control. No medications needed. She will continue to monitor and let me know if any progression.  4. Palpitations Associated with anxiety only. Will check labwork for baseline.  - Comprehensive metabolic panel; Future - CBC with Differential/Platelet; Future - TSH; Future  5. Lipid screening - Lipid panel; Future  Return in about 1 year (around 01/16/2019) for physical exam.  Micheline Rough, MD

## 2018-01-15 NOTE — Patient Instructions (Addendum)
Keep up healthy lifestyle!  Return for fasting bloodwork (Ok to drink water) and TB test placement at your convenience.   I will call you with results when I get them.

## 2018-01-19 ENCOUNTER — Other Ambulatory Visit (INDEPENDENT_AMBULATORY_CARE_PROVIDER_SITE_OTHER): Payer: BLUE CROSS/BLUE SHIELD

## 2018-01-19 ENCOUNTER — Telehealth: Payer: Self-pay | Admitting: Family Medicine

## 2018-01-19 ENCOUNTER — Ambulatory Visit (INDEPENDENT_AMBULATORY_CARE_PROVIDER_SITE_OTHER): Payer: BLUE CROSS/BLUE SHIELD | Admitting: Family Medicine

## 2018-01-19 DIAGNOSIS — Z13220 Encounter for screening for lipoid disorders: Secondary | ICD-10-CM | POA: Diagnosis not present

## 2018-01-19 DIAGNOSIS — R002 Palpitations: Secondary | ICD-10-CM

## 2018-01-19 DIAGNOSIS — Z111 Encounter for screening for respiratory tuberculosis: Secondary | ICD-10-CM | POA: Diagnosis not present

## 2018-01-19 DIAGNOSIS — Z23 Encounter for immunization: Secondary | ICD-10-CM

## 2018-01-19 LAB — COMPREHENSIVE METABOLIC PANEL
ALT: 15 U/L (ref 0–35)
AST: 16 U/L (ref 0–37)
Albumin: 4.3 g/dL (ref 3.5–5.2)
Alkaline Phosphatase: 21 U/L — ABNORMAL LOW (ref 39–117)
BUN: 9 mg/dL (ref 6–23)
CO2: 28 mEq/L (ref 19–32)
Calcium: 9.5 mg/dL (ref 8.4–10.5)
Chloride: 97 mEq/L (ref 96–112)
Creatinine, Ser: 0.78 mg/dL (ref 0.40–1.20)
GFR: 90.96 mL/min (ref >60.00)
Glucose, Bld: 85 mg/dL (ref 70–99)
Potassium: 4 mEq/L (ref 3.5–5.1)
Sodium: 133 mEq/L — ABNORMAL LOW (ref 135–145)
Total Bilirubin: 0.6 mg/dL (ref 0.2–1.2)
Total Protein: 6.7 g/dL (ref 6.0–8.3)

## 2018-01-19 LAB — LIPID PANEL
Cholesterol: 179 mg/dL (ref 0–200)
HDL: 60.7 mg/dL (ref >39.00)
LDL Cholesterol: 106 mg/dL — ABNORMAL HIGH (ref 0–99)
NonHDL: 118.29
Total CHOL/HDL Ratio: 3
Triglycerides: 59 mg/dL (ref 0.0–149.0)
VLDL: 11.8 mg/dL (ref 0.0–40.0)

## 2018-01-19 LAB — CBC WITH DIFFERENTIAL/PLATELET
Basophils Absolute: 0.1 10*3/uL (ref 0.0–0.1)
Basophils Relative: 0.9 % (ref 0.0–3.0)
Eosinophils Absolute: 0.2 10*3/uL (ref 0.0–0.7)
Eosinophils Relative: 3.3 % (ref 0.0–5.0)
HCT: 37 % (ref 36.0–46.0)
Hemoglobin: 12.4 g/dL (ref 12.0–15.0)
Lymphocytes Relative: 33.2 % (ref 12.0–46.0)
Lymphs Abs: 2 10*3/uL (ref 0.7–4.0)
MCHC: 33.5 g/dL (ref 30.0–36.0)
MCV: 82.2 fl (ref 78.0–100.0)
Monocytes Absolute: 0.4 10*3/uL (ref 0.1–1.0)
Monocytes Relative: 7.3 % (ref 3.0–12.0)
Neutro Abs: 3.4 10*3/uL (ref 1.4–7.7)
Neutrophils Relative %: 55.3 % (ref 43.0–77.0)
Platelets: 306 10*3/uL (ref 150.0–400.0)
RBC: 4.51 Mil/uL (ref 3.87–5.11)
RDW: 14.1 % (ref 11.5–15.5)
WBC: 6.1 10*3/uL (ref 4.0–10.5)

## 2018-01-19 LAB — TSH: TSH: 0.72 u[IU]/mL (ref 0.35–4.50)

## 2018-01-19 NOTE — Telephone Encounter (Signed)
Health Examination Certificate to be filled out.  Placed in dr's folder.  Call [phone] upon completion.

## 2018-01-21 ENCOUNTER — Ambulatory Visit: Payer: BLUE CROSS/BLUE SHIELD

## 2018-01-21 LAB — TB SKIN TEST
Induration: 0 mm
TB Skin Test: NEGATIVE

## 2018-01-21 NOTE — Telephone Encounter (Signed)
Left a detailed message on verified voice mail informing patient that her TB skin test needs to be read in order for me to complete her form. The TBskin test was placed 01/19/18 at 1:43pm, the patient was informed that the window to read the test is 48-72hrs and has been instructed to call back and make and lab/nurse visit appointment to have the TB skin test read.

## 2018-01-21 NOTE — Telephone Encounter (Signed)
Patient was seen for TB skin test read and was given the form while she was in the office.

## 2018-03-01 ENCOUNTER — Ambulatory Visit: Payer: BLUE CROSS/BLUE SHIELD | Admitting: Gastroenterology

## 2018-03-01 ENCOUNTER — Encounter: Payer: Self-pay | Admitting: Gastroenterology

## 2018-03-01 ENCOUNTER — Other Ambulatory Visit (INDEPENDENT_AMBULATORY_CARE_PROVIDER_SITE_OTHER): Payer: BLUE CROSS/BLUE SHIELD

## 2018-03-01 ENCOUNTER — Telehealth: Payer: Self-pay | Admitting: Gastroenterology

## 2018-03-01 VITALS — BP 110/80 | HR 56 | Ht 62.5 in | Wt 132.2 lb

## 2018-03-01 DIAGNOSIS — K5010 Crohn's disease of large intestine without complications: Secondary | ICD-10-CM

## 2018-03-01 DIAGNOSIS — R1012 Left upper quadrant pain: Secondary | ICD-10-CM

## 2018-03-01 LAB — COMPREHENSIVE METABOLIC PANEL
ALT: 28 U/L (ref 0–35)
AST: 17 U/L (ref 0–37)
Albumin: 4.5 g/dL (ref 3.5–5.2)
Alkaline Phosphatase: 19 U/L — ABNORMAL LOW (ref 39–117)
BUN: 7 mg/dL (ref 6–23)
CO2: 30 mEq/L (ref 19–32)
Calcium: 9.5 mg/dL (ref 8.4–10.5)
Chloride: 103 mEq/L (ref 96–112)
Creatinine, Ser: 0.74 mg/dL (ref 0.40–1.20)
GFR: 96.59 mL/min (ref >60.00)
Glucose, Bld: 95 mg/dL (ref 70–99)
Potassium: 4 mEq/L (ref 3.5–5.1)
Sodium: 137 mEq/L (ref 135–145)
Total Bilirubin: 0.4 mg/dL (ref 0.2–1.2)
Total Protein: 7 g/dL (ref 6.0–8.3)

## 2018-03-01 LAB — CBC WITH DIFFERENTIAL/PLATELET
Basophils Absolute: 0 10*3/uL (ref 0.0–0.1)
Basophils Relative: 0.7 % (ref 0.0–3.0)
Eosinophils Absolute: 0.2 10*3/uL (ref 0.0–0.7)
Eosinophils Relative: 2.4 % (ref 0.0–5.0)
HCT: 37.9 % (ref 36.0–46.0)
Hemoglobin: 12.2 g/dL (ref 12.0–15.0)
Lymphocytes Relative: 30.2 % (ref 12.0–46.0)
Lymphs Abs: 2.2 10*3/uL (ref 0.7–4.0)
MCHC: 32.3 g/dL (ref 30.0–36.0)
MCV: 84.1 fl (ref 78.0–100.0)
Monocytes Absolute: 0.6 10*3/uL (ref 0.1–1.0)
Monocytes Relative: 8.4 % (ref 3.0–12.0)
Neutro Abs: 4.2 10*3/uL (ref 1.4–7.7)
Neutrophils Relative %: 58.3 % (ref 43.0–77.0)
Platelets: 296 10*3/uL (ref 150.0–400.0)
RBC: 4.5 Mil/uL (ref 3.87–5.11)
RDW: 14.1 % (ref 11.5–15.5)
WBC: 7.1 10*3/uL (ref 4.0–10.5)

## 2018-03-01 LAB — HIGH SENSITIVITY CRP: CRP, High Sensitivity: 0.91 mg/L (ref 0.000–5.000)

## 2018-03-01 LAB — SEDIMENTATION RATE: Sed Rate: 7 mm/hr (ref 0–20)

## 2018-03-01 MED ORDER — MESALAMINE 1.2 G PO TBEC: DELAYED_RELEASE_TABLET | ORAL | 5 refills | Status: DC

## 2018-03-01 NOTE — Telephone Encounter (Signed)
Patient will come in today at 2:30 to see Doug Sou, PA

## 2018-03-01 NOTE — Telephone Encounter (Signed)
Patient states she is having a flare up of her crohn's colitis and would like advice until appt on Friday 10.18.19 with APP Amy.

## 2018-03-01 NOTE — Progress Notes (Signed)
Reviewed and agree with initial management plan.  Malcolm T. Stark, MD FACG 

## 2018-03-01 NOTE — Patient Instructions (Signed)
Your provider has requested that you go to the basement level for lab work before leaving today. Press "B" on the elevator. The lab is located at the first door on the left as you exit the elevator. We sent refills to your pharmacy for Lialda 1.2 g.   You have been scheduled for a CT scan of the abdomen and pelvis at [facility] ([address]---this is in the same building as Press [facility]).   You are scheduled on Wednesday 03-03-2018 at 4:00 PM. You should arrive at 2:45 PM  to your appointment time for registration. Please follow the written instructions below on the day of your exam: You will drink the oral contrast there.   WARNING: IF YOU ARE ALLERGIC TO IODINE/X-RAY DYE, PLEASE NOTIFY RADIOLOGY IMMEDIATELY AT [phone]! YOU WILL BE GIVEN A 13 HOUR PREMEDICATION PREP.  1) Do not eat  anything after 12:00 Noon (4 hours prior to your test)  You may take any medications as prescribed with a small amount of water, if necessary. If you take any of the following medications: METFORMIN, GLUCOPHAGE, GLUCOVANCE, AVANDAMET, RIOMET, FORTAMET, [place]ta[place] MET, JANUMET, GLUMETZA or METAGLIP, you MAY be asked to HOLD this medication 48 hours AFTER the exam.  The purpose of you drinking the oral contrast is to aid in the visualization of your intestinal tract. The contrast solution may cause some diarrhea. Depending on your individual set of symptoms, you may also receive an intravenous injection of x-ray contrast/dye. Plan on being at [facility] for 30 minutes or longer, depending on the type of exam you are having performed.  This test typically takes 30-45 minutes to complete.  If you have any questions regarding your exam or if you need to reschedule, you may call the CT department at [phone] between the hours of 8:00 am and 5:00 pm, Monday-Friday.  _______________________________________________________________________Normal BMI (Body Mass Index- based on height and  weight) is between 19 and 25. Your BMI today is Body mass index is 23.8 kg/m. Marland Kitchen Please consider follow up  regarding your BMI with your Primary Care Provider._

## 2018-03-01 NOTE — Progress Notes (Signed)
03/01/2018 Tyreese Kniep Szuch [id number] January 22, 1986   HISTORY OF PRESENT ILLNESS: This is a very pleasant 32 year old female who is a patient of Dr. Anselm Jungling.  She was diagnosed with Crohn's disease in 2008 and then had not returned for follow-up in several years.  She was seen last in April 2017 at which time she underwent colonoscopy again with Dr. Russella Dar and was found to have the following:  - A few erosions in the terminal ileum. Biopsied. - A few erosions at the ileocecal valve. Biopsied. - Congested mucosa in the descending colon. Biopsied. - Granularity in the recto-sigmoid colon. Biopsied. - Small Grade I internal hemorrhoids.  The TI biopsies were actually normal.  IC valve did show erosion with mildly inflamed granulation tissue. Colon biopsies showed benign mucosa with lymphoid aggregates.  Anyway, she had been prescribed Lialda 2.4 g daily.  She tells me that she has not actually taken it in the past 2-1/2 years or so.  Says that Saturday night she started having constant abdominal pain left upper quadrant and pelvic area.  She says this was doubled-over type of pain.  She says that she has been having pretty normal bowel movements, but they appear to have some stringy mucus in them.  Denies rectal bleeding.  Today she is feeling slightly better, but has not eaten much of anything.   Past Medical History:  Diagnosis Date  . Crohn's colitis ([facility])   . GERD (gastroesophageal reflux disease)    no meds  . Headache(784.0)   . Normal labor 03/27/2013  . Postpartum care following cesarean delivery (11/9) 03/27/2013   Past Surgical History:  Procedure Laterality Date  . CESAREAN SECTION N/A 03/27/2013   Procedure: CESAREAN SECTION;  Surgeon: Lenoard Aden, MD;  Location: [facility];  Service: Obstetrics;  Laterality: N/A;  . CESAREAN SECTION N/A 09/04/2014   Procedure: REPEAT CESAREAN SECTION;  Surgeon: Jaymes Graff, MD;  Location: [facility];  Service: Obstetrics;   Laterality: N/A;  . HAND SURGERY  2012   with nerve block  . LAPAROSCOPIC ENDOMETRIOSIS FULGURATION    . WISDOM TOOTH EXTRACTION      reports that she has never smoked. She has quit using smokeless tobacco.  Her smokeless tobacco use included chew. She reports that she does not drink alcohol or use drugs. family history includes Alcohol abuse in her maternal grandfather; Arthritis-Osteo in her maternal grandmother; COPD in her mother; Cancer in her maternal grandfather; Diverticulitis in her mother; Heart disease in her maternal grandmother and mother; Heart failure in her mother; Hernia in her mother; Kidney disease in her mother; Osteoporosis in her mother; Other in her father; Stroke (age of onset: 36) in her mother. No Known Allergies    Outpatient Encounter Medications as of 03/01/2018  Medication Sig  . cetirizine (ZYRTEC) 10 MG tablet Take 10 mg by mouth daily.  . diphenhydrAMINE (BENADRYL) 25 mg capsule Take 25 mg by mouth at bedtime as needed for allergies. For itching  . fluticasone (FLONASE) 50 MCG/ACT nasal spray Place 1 spray into both nostrils daily as needed for allergies or rhinitis.  Marland Kitchen ibuprofen (ADVIL,MOTRIN) 600 MG tablet Take 1 tablet (600 mg total) by mouth every 6 (six) hours as needed.  . mesalamine (LIALDA) 1.2 g EC tablet Take 2 tablets (2.4 g total) by mouth daily with breakfast.   No facility-administered encounter medications on file as of 03/01/2018.      REVIEW OF SYSTEMS  : All other systems reviewed and negative  except where noted in the History of Present Illness.   PHYSICAL EXAM: BP 110/80 (BP Location: Left Arm, Patient Position: Sitting, Cuff Size: Normal)   Pulse (!) 56   Ht 5' 2.5" (1.588 m) Comment: height measured without shoes  Wt 132 lb 4 oz (60 kg)   LMP 02/09/2018   Breastfeeding? No   BMI 23.80 kg/m  General: Well developed white female in no acute distress Head: Normocephalic and atraumatic Eyes:  Sclerae anicteric, conjunctiva  pink. Ears: Normal auditory acuity Lungs: Clear throughout to auscultation; no increased WOB. Heart: Regular rate and rhythm; no M/R/G. Abdomen: Soft, non-distended.  BS present.  LUQ and suprapubic TTP. Musculoskeletal: Symmetrical with no gross deformities  Skin: No lesions on visible extremities Extremities: No edema  Neurological: Alert oriented x 4, grossly non-focal Psychological:  Alert and cooperative. Normal mood and affect  ASSESSMENT AND PLAN: *Crohn's disease, colon and TI:  Seems quite mild fortunately.  Had only ever been on Lialda and has not taken it in the past 2 to 2-1/2 years.  Now complaining of abdominal pains, quite severe, since Saturday.  Will check labs including CBC, CMP, sed rate, and CRP.  I will restart her Lialda at 4.8 g daily.  I am actually going to schedule her for a CT enterography to evaluate the rest of her small bowel as well.  I do not know that she has ever really had this done and she did have disease at the TI/IC valve.  If symptoms worsen further again then may need a course of prednisone.   CC:  Wynn Banker, MD

## 2018-03-03 ENCOUNTER — Ambulatory Visit (INDEPENDENT_AMBULATORY_CARE_PROVIDER_SITE_OTHER)
Admission: RE | Admit: 2018-03-03 | Discharge: 2018-03-03 | Disposition: A | Discharge status: Home / Self Care | Payer: BLUE CROSS/BLUE SHIELD | Source: Ambulatory Visit | Attending: Gastroenterology | Admitting: Gastroenterology

## 2018-03-03 DIAGNOSIS — K5010 Crohn's disease of large intestine without complications: Secondary | ICD-10-CM

## 2018-03-03 DIAGNOSIS — R1012 Left upper quadrant pain: Secondary | ICD-10-CM | POA: Diagnosis not present

## 2018-03-03 MED ORDER — IOPAMIDOL (ISOVUE-300) INJECTION 61%
100.0000 mL | Freq: Once | INTRAVENOUS | Status: AC | PRN
  Administered 2018-03-03: 100 mL via INTRAVENOUS

## 2018-03-04 ENCOUNTER — Telehealth: Payer: Self-pay | Admitting: Gastroenterology

## 2018-03-04 NOTE — Telephone Encounter (Signed)
The pt has been advised that the diarrhea is expected after contrast, but has nothing to do with her period.  She will call GYN if bleeding persist beyond her normal period

## 2018-03-05 ENCOUNTER — Ambulatory Visit: Payer: BLUE CROSS/BLUE SHIELD | Admitting: Physician Assistant

## 2018-03-05 ENCOUNTER — Other Ambulatory Visit: Payer: Self-pay

## 2018-03-05 DIAGNOSIS — K769 Liver disease, unspecified: Secondary | ICD-10-CM

## 2018-03-05 MED ORDER — DICYCLOMINE HCL 10 MG PO CAPS: 10.0000 mg | ORAL_CAPSULE | Freq: Three times a day (TID) | ORAL | 0 refills | Status: DC

## 2018-03-05 MED ORDER — MESALAMINE 1.2 G PO TBEC: 4.8000 g | DELAYED_RELEASE_TABLET | Freq: Every day | ORAL | 2 refills | Status: DC

## 2018-03-11 ENCOUNTER — Ambulatory Visit (HOSPITAL_COMMUNITY)
Admission: RE | Admit: 2018-03-11 | Discharge: 2018-03-11 | Disposition: A | Discharge status: Home / Self Care | Payer: BLUE CROSS/BLUE SHIELD | Source: Ambulatory Visit | Attending: Gastroenterology | Admitting: Gastroenterology

## 2018-03-11 DIAGNOSIS — K769 Liver disease, unspecified: Secondary | ICD-10-CM | POA: Insufficient documentation

## 2018-03-11 DIAGNOSIS — K7689 Other specified diseases of liver: Secondary | ICD-10-CM | POA: Diagnosis not present

## 2018-03-11 MED ORDER — GADOXETATE DISODIUM 0.25 MMOL/ML IV SOLN
6.0000 mL | Freq: Once | INTRAVENOUS | Status: AC | PRN
  Administered 2018-03-11: 6 mL via INTRAVENOUS

## 2018-03-27 ENCOUNTER — Other Ambulatory Visit: Payer: Self-pay | Admitting: Gastroenterology

## 2018-07-29 DIAGNOSIS — Z113 Encounter for screening for infections with a predominantly sexual mode of transmission: Secondary | ICD-10-CM | POA: Diagnosis not present

## 2018-07-29 DIAGNOSIS — R87610 Atypical squamous cells of undetermined significance on cytologic smear of cervix (ASC-US): Secondary | ICD-10-CM | POA: Diagnosis not present

## 2018-07-29 DIAGNOSIS — Z01419 Encounter for gynecological examination (general) (routine) without abnormal findings: Secondary | ICD-10-CM | POA: Diagnosis not present

## 2018-07-29 DIAGNOSIS — Z124 Encounter for screening for malignant neoplasm of cervix: Secondary | ICD-10-CM | POA: Diagnosis not present

## 2018-07-29 DIAGNOSIS — A499 Bacterial infection, unspecified: Secondary | ICD-10-CM | POA: Diagnosis not present

## 2018-07-29 DIAGNOSIS — Z3040 Encounter for surveillance of contraceptives, unspecified: Secondary | ICD-10-CM | POA: Diagnosis not present

## 2018-07-29 DIAGNOSIS — N898 Other specified noninflammatory disorders of vagina: Secondary | ICD-10-CM | POA: Diagnosis not present

## 2018-08-20 DIAGNOSIS — R05 Cough: Secondary | ICD-10-CM | POA: Diagnosis not present

## 2018-08-20 DIAGNOSIS — J3081 Allergic rhinitis due to animal (cat) (dog) hair and dander: Secondary | ICD-10-CM | POA: Diagnosis not present

## 2018-08-20 DIAGNOSIS — J3089 Other allergic rhinitis: Secondary | ICD-10-CM | POA: Diagnosis not present

## 2018-08-20 DIAGNOSIS — J301 Allergic rhinitis due to pollen: Secondary | ICD-10-CM | POA: Diagnosis not present

## 2018-08-25 DIAGNOSIS — J3081 Allergic rhinitis due to animal (cat) (dog) hair and dander: Secondary | ICD-10-CM | POA: Diagnosis not present

## 2018-08-25 DIAGNOSIS — J301 Allergic rhinitis due to pollen: Secondary | ICD-10-CM | POA: Diagnosis not present

## 2018-08-26 DIAGNOSIS — J3089 Other allergic rhinitis: Secondary | ICD-10-CM | POA: Diagnosis not present

## 2018-09-07 ENCOUNTER — Other Ambulatory Visit: Payer: Self-pay | Admitting: Obstetrics and Gynecology

## 2018-09-07 DIAGNOSIS — N871 Moderate cervical dysplasia: Secondary | ICD-10-CM | POA: Diagnosis not present

## 2018-09-07 DIAGNOSIS — N870 Mild cervical dysplasia: Secondary | ICD-10-CM | POA: Diagnosis not present

## 2018-09-07 DIAGNOSIS — B977 Papillomavirus as the cause of diseases classified elsewhere: Secondary | ICD-10-CM | POA: Diagnosis not present

## 2018-09-07 DIAGNOSIS — R87610 Atypical squamous cells of undetermined significance on cytologic smear of cervix (ASC-US): Secondary | ICD-10-CM | POA: Diagnosis not present

## 2018-09-15 ENCOUNTER — Encounter: Payer: Self-pay | Admitting: Family Medicine

## 2018-09-15 ENCOUNTER — Ambulatory Visit (INDEPENDENT_AMBULATORY_CARE_PROVIDER_SITE_OTHER): Payer: BLUE CROSS/BLUE SHIELD | Admitting: Family Medicine

## 2018-09-15 ENCOUNTER — Other Ambulatory Visit: Payer: Self-pay

## 2018-09-15 DIAGNOSIS — F419 Anxiety disorder, unspecified: Secondary | ICD-10-CM | POA: Diagnosis not present

## 2018-09-15 DIAGNOSIS — F502 Bulimia nervosa: Secondary | ICD-10-CM | POA: Insufficient documentation

## 2018-09-15 DIAGNOSIS — F321 Major depressive disorder, single episode, moderate: Secondary | ICD-10-CM | POA: Diagnosis not present

## 2018-09-15 MED ORDER — ESCITALOPRAM OXALATE 10 MG PO TABS: 10.0000 mg | ORAL_TABLET | Freq: Every day | ORAL | 2 refills | Status: DC

## 2018-09-15 NOTE — Progress Notes (Addendum)
Virtual Visit via Video Note  I connected with Sonia [name] on 09/15/18 at  2:30 PM EDT by a video enabled telemedicine application and verified that I am speaking with the correct person using two identifiers.  Location patient: home Location provider:work or home office Persons participating in the virtual visit: patient, provider  I discussed the limitations of evaluation and management by telemedicine and the availability of in person appointments. The patient expressed understanding and agreed to proceed.   Sonia [name] DOB: 08-27-1985 Encounter date: 09/15/2018  This is a 33 y.o. female who presents with No chief complaint on file.   History of present illness:  At last visit mentioned anxiety but she states she was not completely honest about her anxiety.   For last 4 years since having last child she had struggled with post partum depression. Has been bulemic, but feels she is doing better with this. At one point was down to 119 pounds, but now at 140.   Doesn't like to talk about this much.   She is still part time for school. Working from home. Job is not hard, but she has a 33 year old and 33 year old, so interrupted frequently.   Allergies bother her. Have been getting worse. This year she went to allergist and she is allergic to 54/58 environmental allergens. She was given inhalers and is starting allergy shots. This causes her to stay congested. Feeling a little better.   Bothers her not to have interactions with other people.   Marriage is stable. Inlaws trigger a lot of her anxiety. Right now she is avoiding contact with them which actually is helpful for her.   She feels like she probably needed help before now. She feels like she is not being a good mom now; lacking motivation.   4 years ago husband had pain med addiction which she was unaware of until it came out, but it was during this time that her bulemia started. Thinks it was  related to him "not paying attention" and she thought he just wasn't interested in her. She tried to get family involved and they were not receptive.   Having more anxiety attacks lately. obgyn gave her alprazolam, but gave her just months supply.     Does try to get outside which helps except for allergies. Not exercising on regular basis. She tends to over-do exercise with the eating behaviors.   Never been on medication in past.   Anxiety triggers her anger, frustration. She would also like to work on depressed mood.   No Known Allergies Current Meds  Medication Sig  . budesonide-formoterol (SYMBICORT) 80-4.5 MCG/ACT inhaler Inhale 2 puffs into the lungs 2 (two) times a day.  . cetirizine (ZYRTEC) 10 MG tablet Take 10 mg by mouth daily.  Marland Kitchen dicyclomine (BENTYL) 10 MG capsule Take 1 capsule (10 mg total) by mouth 3 (three) times daily before meals.  . diphenhydrAMINE (BENADRYL) 25 mg capsule Take 25 mg by mouth at bedtime as needed for allergies. For itching  . fluticasone (FLONASE) 50 MCG/ACT nasal spray Place 1 spray into both nostrils daily as needed for allergies or rhinitis.  Marland Kitchen montelukast (SINGULAIR) 10 MG tablet Take 10 mg by mouth at bedtime.  Donnetta Hail Estradiol (SPRINTEC 28 PO) Take by mouth.  . [DISCONTINUED] mesalamine (LIALDA) 1.2 g EC tablet Take 4 tablets ( 8 grams) by mouth daily.    Review of Systems  Gastrointestinal: Positive for nausea (after eating). Negative  for blood in stool, constipation and diarrhea.  Psychiatric/Behavioral: The patient is nervous/anxious.        Has anxiety at bedtime. Takes benadryl and melatonin. Will be up for 1-2 hours with anxiety.  Wakes up at least 4 times/night.     Objective:  There were no vitals taken for this visit.      BP Readings from Last 3 Encounters:  03/01/18 110/80  01/15/18 120/64  08/30/15 102/67   Wt Readings from Last 3 Encounters:  03/01/18 132 lb 4 oz (60 kg)  01/15/18 134 lb 3.2 oz (60.9 kg)   08/30/15 137 lb (62.1 kg)    EXAM:  GENERAL: alert, oriented, appears well and in no acute distress  PSYCH/NEURO: pleasant and cooperative.  Depression screen [facility] 2/9 09/15/2018  Decreased Interest 2  Down, Depressed, Hopeless 3  PHQ - 2 Score 5  Altered sleeping 3  Tired, decreased energy 3  Change in appetite 3  Feeling bad or failure about yourself  3  Trouble concentrating 0  Moving slowly or fidgety/restless 0  Suicidal thoughts 0  PHQ-9 Score 17  Difficult doing work/chores Somewhat difficult   She does have episodes of anxiety, panic attacks. Has times of irritability/anger. Has sleeping difficulties worsened by anxiety. Has lack of motivation in general.   Assessment/Plan  1. Anxiety  We had long discussion about medication options to help with anxiety as well as depression.  I do think that something in the SSRI category would be best for right now to help with overall anxiety and depression.  These have been ongoing for some years but without treatment.  We also discussed importance of therapy and benefits of therapy.  After talking today she does feel like she is more ready to consider this option and does see the benefit of starting therapy.  The number was given for  mental health and she states she will call and set up an appointment.  Call back if any worsening of mood, otherwise we will set up a 2-week follow-up appointment.  We discussed using Benadryl and melatonin for sleep as needed. Discussed new medication(s) today with patient. Discussed potential side effects and patient verbalized understanding.  - escitalopram (LEXAPRO) 10 MG tablet; Take 1 tablet (10 mg total) by mouth daily.  Dispense: 30 tablet; Refill: 2  2. Bulimia nervosa, purging type I feel this will be most helpful to discuss with therapist.  I also think that his were able to work on anxiety and depression and she is able to talk through some of her triggers with the therapist that she  will be better able to manage.  I do not think we need any blood work at this time since her last purging episode was over a month ago.  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  We will monitor for improvement on medication.    Return in about 2 weeks (around 09/29/2018) for new medication follow up; 30 min.   I discussed the assessment and treatment plan with the patient. The patient was provided an opportunity to ask questions and all were answered. The patient agreed with the plan and demonstrated an understanding of the instructions.   The patient was advised to call back or seek an in-person evaluation if the symptoms worsen or if the condition fails to improve as anticipated.  I provided 30 minutes of non-face-to-face time during this encounter.   Micheline Rough, MD

## 2018-09-17 ENCOUNTER — Encounter: Payer: Self-pay | Admitting: Family Medicine

## 2018-09-21 DIAGNOSIS — N871 Moderate cervical dysplasia: Secondary | ICD-10-CM | POA: Diagnosis not present

## 2018-09-21 DIAGNOSIS — D069 Carcinoma in situ of cervix, unspecified: Secondary | ICD-10-CM | POA: Diagnosis not present

## 2018-09-21 DIAGNOSIS — N870 Mild cervical dysplasia: Secondary | ICD-10-CM | POA: Diagnosis not present

## 2018-09-29 ENCOUNTER — Encounter: Payer: Self-pay | Admitting: Family Medicine

## 2018-09-29 ENCOUNTER — Other Ambulatory Visit: Payer: Self-pay

## 2018-09-29 ENCOUNTER — Ambulatory Visit (INDEPENDENT_AMBULATORY_CARE_PROVIDER_SITE_OTHER): Payer: BLUE CROSS/BLUE SHIELD | Admitting: Family Medicine

## 2018-09-29 VITALS — Temp 97.7°F

## 2018-09-29 DIAGNOSIS — F419 Anxiety disorder, unspecified: Secondary | ICD-10-CM

## 2018-09-29 DIAGNOSIS — F502 Bulimia nervosa, unspecified: Secondary | ICD-10-CM

## 2018-09-29 DIAGNOSIS — G4700 Insomnia, unspecified: Secondary | ICD-10-CM

## 2018-09-29 DIAGNOSIS — F321 Major depressive disorder, single episode, moderate: Secondary | ICD-10-CM

## 2018-09-29 MED ORDER — TRAZODONE HCL 50 MG PO TABS: 25.0000 mg | ORAL_TABLET | Freq: Every evening | ORAL | 2 refills | Status: DC | PRN

## 2018-09-29 NOTE — Progress Notes (Signed)
Virtual Visit via Video Note  I connected with Sonia [name]  on 09/29/18 at 11:30 AM EDT by a video enabled telemedicine application and verified that I am speaking with the correct person using two identifiers.  Location patient: home Location provider:work or home office Persons participating in the virtual visit: patient, provider  I discussed the limitations of evaluation and management by telemedicine and the availability of in person appointments. The patient expressed understanding and agreed to proceed.   Sonia [name] DOB: Sep 23, 1985 Encounter date: 09/29/2018  This is a 33 y.o. female who presents with Chief Complaint  Patient presents with  . Follow-up    History of present illness: Last visit with me was 4/29; we discussed anxiety, depression, bulimia. Lexapro was started.  HPI  Not nauseated any more. No headaches.   Still having anxiety. Feels like she just can't catch breath. Last few days have been bad. Feels like she is not moping around as much. Energy level is up a little. Has been on full tab nearly 2 weeks now. Has to get a LEEP done next week for abnormal pap. Mom is also in hospital with infection.   Waking up a lot during night. Takes melatonin, benadryl for sleep. Hard to fall back asleep. Kids wake her up. Waking up for different things at night. Still pretty tired in the morning but not in the bad mood/can't get out of bed tired. Not tried sleep medications in past. Would be interested in something if it helps.   Didn't reach out yet for counseling. She hasn't wanted to do this yet.   Has coughing fits - coughs until she coughs out something and then is fine. 2-3 times/day. Wondering if she can take mucinex. She is taking claritin, singulair, symbicort right now. Feels short of breath sometimes - but worse with anxiety. Allergies are typically better in summer.   Done well with dairy intake.  She is not overeating which helps  decrease feelings of guilt after meals.  No purging.  No Known Allergies Current Meds  Medication Sig  . budesonide-formoterol (SYMBICORT) 80-4.5 MCG/ACT inhaler Inhale 2 puffs into the lungs 2 (two) times a day.  . cetirizine (ZYRTEC) 10 MG tablet Take 10 mg by mouth daily.  Marland Kitchen dicyclomine (BENTYL) 10 MG capsule Take 1 capsule (10 mg total) by mouth 3 (three) times daily before meals.  . diphenhydrAMINE (BENADRYL) 25 mg capsule Take 25 mg by mouth at bedtime as needed for allergies. For itching  . escitalopram (LEXAPRO) 10 MG tablet Take 1 tablet (10 mg total) by mouth daily.  . fluticasone (FLONASE) 50 MCG/ACT nasal spray Place 1 spray into both nostrils daily as needed for allergies or rhinitis.  Marland Kitchen montelukast (SINGULAIR) 10 MG tablet Take 10 mg by mouth at bedtime.  Suzzanne Cloud Estradiol (SPRINTEC 28 PO) Take by mouth.    Review of Systems  Constitutional: Negative for chills, fatigue and fever.  Respiratory: Negative for cough, chest tightness, shortness of breath and wheezing.   Cardiovascular: Negative for chest pain, palpitations and leg swelling.  Psychiatric/Behavioral: Positive for sleep disturbance. Negative for suicidal ideas. The patient is nervous/anxious.     Objective:  Temp 97.7 F (36.5 C) Comment: taken by pt-jaf      BP Readings from Last 3 Encounters:  03/01/18 110/80  01/15/18 120/64  08/30/15 102/67   Wt Readings from Last 3 Encounters:  03/01/18 132 lb 4 oz (60 kg)  01/15/18 134 lb 3.2 oz (60.9  kg)  08/30/15 137 lb (62.1 kg)    EXAM:  GENERAL: alert, oriented, appears well and in no acute distress  HEENT: atraumatic, conjunctiva clear, no obvious abnormalities on inspection of external nose and ears  NECK: normal movements of the head and neck  LUNGS: on inspection no signs of respiratory distress, breathing rate appears normal, no obvious gross SOB, gasping or wheezing  CV: no obvious cyanosis  MS: moves all visible extremities without  noticeable abnormality  PSYCH/NEURO: pleasant and cooperative, no obvious depression or anxiety, speech and thought processing grossly intact  Assessment/Plan  1. Anxiety This is improved since last visit on the Lexapro.  We discussed options including increasing dose of medication since she is still having some significant anxiety symptoms.  We have decided to stay on the same dose and she will give me a MyChart update in 2 weeks time.  We are going to work on sleeping issues which I think will also help from an anxiety standpoint.  2. Moderate major depression (HCC) Motivation has improved significantly.  She is able to get up out of bed in the morning and interact with children much easier than she was previously.  We will plan for recheck pending her update in 2 weeks time.  3. Bulimia nervosa, purging type Currently stable.  I think that having some control of her mood is going to help with some of her impulse control with regards to this.  She has not scheduled therapy visit, but we reviewed today to consider this as I think it will help long-term with identifying triggers for eating disorder as well as learning self-control techniques for anxiety and depression.  4. Insomnia: Trial of trazodone to help with sleep.Discussed new medication(s) today with patient. Discussed potential side effects and patient verbalized understanding.   She also asked if she was able to take Mucinex due to sinus congestion symptoms.  We discussed that plain Mucinex is safer with the Lexapro, but if she only has Mucinex DM on hand this would be okay to use intermittently.   Return mychart update 2 weeks; follow up pending this.   I discussed the assessment and treatment plan with the patient. The patient was provided an opportunity to ask questions and all were answered. The patient agreed with the plan and demonstrated an understanding of the instructions.   The patient was advised to call back or seek an  in-person evaluation if the symptoms worsen or if the condition fails to improve as anticipated.  I provided 20 minutes of non-face-to-face time during this encounter.   Theodis Shove, MD

## 2018-09-30 DIAGNOSIS — Z01818 Encounter for other preprocedural examination: Secondary | ICD-10-CM | POA: Diagnosis not present

## 2018-09-30 DIAGNOSIS — Z1159 Encounter for screening for other viral diseases: Secondary | ICD-10-CM | POA: Diagnosis not present

## 2018-09-30 DIAGNOSIS — D069 Carcinoma in situ of cervix, unspecified: Secondary | ICD-10-CM | POA: Diagnosis not present

## 2018-10-05 ENCOUNTER — Other Ambulatory Visit: Payer: Self-pay | Admitting: Obstetrics and Gynecology

## 2018-10-05 DIAGNOSIS — D069 Carcinoma in situ of cervix, unspecified: Secondary | ICD-10-CM | POA: Diagnosis not present

## 2018-10-05 DIAGNOSIS — N870 Mild cervical dysplasia: Secondary | ICD-10-CM | POA: Diagnosis not present

## 2018-10-07 ENCOUNTER — Other Ambulatory Visit: Payer: Self-pay | Admitting: Family Medicine

## 2018-10-07 DIAGNOSIS — F419 Anxiety disorder, unspecified: Secondary | ICD-10-CM

## 2018-10-21 ENCOUNTER — Other Ambulatory Visit: Payer: Self-pay | Admitting: Family Medicine

## 2018-11-10 DIAGNOSIS — J301 Allergic rhinitis due to pollen: Secondary | ICD-10-CM | POA: Diagnosis not present

## 2018-11-10 DIAGNOSIS — J3081 Allergic rhinitis due to animal (cat) (dog) hair and dander: Secondary | ICD-10-CM | POA: Diagnosis not present

## 2018-11-10 DIAGNOSIS — J3089 Other allergic rhinitis: Secondary | ICD-10-CM | POA: Diagnosis not present

## 2018-11-12 DIAGNOSIS — J301 Allergic rhinitis due to pollen: Secondary | ICD-10-CM | POA: Diagnosis not present

## 2018-11-12 DIAGNOSIS — J3081 Allergic rhinitis due to animal (cat) (dog) hair and dander: Secondary | ICD-10-CM | POA: Diagnosis not present

## 2018-11-12 DIAGNOSIS — J3089 Other allergic rhinitis: Secondary | ICD-10-CM | POA: Diagnosis not present

## 2018-11-15 ENCOUNTER — Other Ambulatory Visit (INDEPENDENT_AMBULATORY_CARE_PROVIDER_SITE_OTHER): Payer: BC Managed Care – PPO

## 2018-11-15 ENCOUNTER — Encounter: Payer: Self-pay | Admitting: Family Medicine

## 2018-11-15 ENCOUNTER — Encounter: Payer: Self-pay | Admitting: Internal Medicine

## 2018-11-15 ENCOUNTER — Other Ambulatory Visit: Payer: Self-pay

## 2018-11-15 ENCOUNTER — Ambulatory Visit (INDEPENDENT_AMBULATORY_CARE_PROVIDER_SITE_OTHER): Payer: BC Managed Care – PPO | Admitting: Internal Medicine

## 2018-11-15 DIAGNOSIS — J3089 Other allergic rhinitis: Secondary | ICD-10-CM | POA: Diagnosis not present

## 2018-11-15 DIAGNOSIS — R3989 Other symptoms and signs involving the genitourinary system: Secondary | ICD-10-CM | POA: Diagnosis not present

## 2018-11-15 DIAGNOSIS — J3081 Allergic rhinitis due to animal (cat) (dog) hair and dander: Secondary | ICD-10-CM | POA: Diagnosis not present

## 2018-11-15 DIAGNOSIS — R300 Dysuria: Secondary | ICD-10-CM

## 2018-11-15 DIAGNOSIS — F419 Anxiety disorder, unspecified: Secondary | ICD-10-CM

## 2018-11-15 DIAGNOSIS — J301 Allergic rhinitis due to pollen: Secondary | ICD-10-CM | POA: Diagnosis not present

## 2018-11-15 LAB — POCT URINALYSIS DIPSTICK
Bilirubin, UA: NEGATIVE
Blood, UA: NEGATIVE
Glucose, UA: NEGATIVE
Ketones, UA: NEGATIVE
Nitrite, UA: NEGATIVE
Odor: NEGATIVE
Protein, UA: NEGATIVE
Spec Grav, UA: 1.02 (ref 1.010–1.025)
Urobilinogen, UA: 0.2 E.U./dL
pH, UA: 6 (ref 5.0–8.0)

## 2018-11-15 MED ORDER — ESCITALOPRAM OXALATE 10 MG PO TABS: 10.0000 mg | ORAL_TABLET | Freq: Every day | ORAL | 0 refills | Status: DC

## 2018-11-15 MED ORDER — NITROFURANTOIN MONOHYD MACRO 100 MG PO CAPS: 100.0000 mg | ORAL_CAPSULE | Freq: Two times a day (BID) | ORAL | 0 refills | Status: AC

## 2018-11-15 NOTE — Progress Notes (Signed)
Virtual Visit via Video Note  I connected with@ on 11/15/18 at  2:30 PM EDT by a video enabled telemedicine application and verified that I am speaking with the correct person using two identifiers. Location patient: home Location provider:work  office Persons participating in the virtual visit: patient, provider  [place] national recommendations  regarding COVID 19 pandemic   video visit is advised over in office visit for this patient.  Patient aware  of the limitations of evaluation and management by telemedicine and  availability of in person appointments. and agreed to proceed.   HPI: Sonia [name] presents for video visit  Concern about uti   1 days of urinary stinging  Some llq tenderness but no abd pain fever chills but feels "yucky"  flank pain  Feels could be UTI  Began azo after urine collection today  And some help  lmp 2 weeks a go and on  ocps  No unusual vaginal sx . Has had leep and to have gyne fu tomorrow.  ROS: See pertinent positives and negatives per HPI. Hx crohns  And in remission   Past Medical History:  Diagnosis Date  . Crohn's colitis (HCC)   . GERD (gastroesophageal reflux disease)    no meds  . Headache(784.0)   . Normal labor 03/27/2013  . Postpartum care following cesarean delivery (11/9) 03/27/2013    Past Surgical History:  Procedure Laterality Date  . CESAREAN SECTION N/A 03/27/2013   Procedure: CESAREAN SECTION;  Surgeon: Lenoard Aden, MD;  Location: [facility];  Service: Obstetrics;  Laterality: N/A;  . CESAREAN SECTION N/A 09/04/2014   Procedure: REPEAT CESAREAN SECTION;  Surgeon: Jaymes Graff, MD;  Location: [facility];  Service: Obstetrics;  Laterality: N/A;  . HAND SURGERY  2012   with nerve block  . LAPAROSCOPIC ENDOMETRIOSIS FULGURATION    . WISDOM TOOTH EXTRACTION      Family History  Problem Relation Age of Onset  . COPD Mother   . Heart disease Mother        smoker  . Hernia Mother   . Diverticulitis Mother   .  Kidney disease Mother   . Heart failure Mother   . Stroke Mother 21  . Osteoporosis Mother   . Anxiety disorder Mother        ? on xanax  . Heart disease Maternal Grandmother        smoker  . Arthritis-Osteo Maternal Grandmother   . Cancer Maternal Grandfather        throat  . Alcohol abuse Maternal Grandfather   . Other Father        pituitary gland tumor  . Depression Sister   . Anxiety disorder Sister   . Colon cancer Neg Hx     Social History   Tobacco Use  . Smoking status: Never Smoker  . Smokeless tobacco: Former Neurosurgeon    Types: Chew  Substance Use Topics  . Alcohol use: No    Comment: occasionally  . Drug use: No      Current Outpatient Medications:  .  budesonide-formoterol (SYMBICORT) 80-4.5 MCG/ACT inhaler, Inhale 2 puffs into the lungs 2 (two) times a day., Disp: , Rfl:  .  cetirizine (ZYRTEC) 10 MG tablet, Take 10 mg by mouth daily., Disp: , Rfl:  .  dicyclomine (BENTYL) 10 MG capsule, Take 1 capsule (10 mg total) by mouth 3 (three) times daily before meals., Disp: 90 capsule, Rfl: 2 .  diphenhydrAMINE (BENADRYL) 25 mg capsule, Take 25 mg  by mouth at bedtime as needed for allergies. For itching, Disp: , Rfl:  .  escitalopram (LEXAPRO) 10 MG tablet, Take 1 tablet (10 mg total) by mouth daily., Disp: 90 tablet, Rfl: 0 .  fluticasone (FLONASE) 50 MCG/ACT nasal spray, Place 1 spray into both nostrils daily as needed for allergies or rhinitis., Disp: , Rfl:  .  montelukast (SINGULAIR) 10 MG tablet, Take 10 mg by mouth at bedtime., Disp: , Rfl:  .  nitrofurantoin, macrocrystal-monohydrate, (MACROBID) 100 MG capsule, Take 1 capsule (100 mg total) by mouth 2 (two) times daily for 5 days. UTI, Disp: 10 capsule, Rfl: 0 .  Norgestimate-Eth Estradiol (SPRINTEC 28 PO), Take by mouth., Disp: , Rfl:  .  traZODone (DESYREL) 50 MG tablet, Take 0.5-1 tablets (25-50 mg total) by mouth at bedtime as needed for sleep., Disp: 30 tablet, Rfl: 2  EXAM: BP Readings from Last 3  Encounters:  03/01/18 110/80  01/15/18 120/64  08/30/15 102/67    VITALS per patient if applicable:  GENERAL: alert, oriented, appears well and in no acute distress  HEENT: atraumatic, conjunttiva clear, no obvious abnormalities on inspection of external nose and ears NECK: normal movements of the head and neck LUNGS: on inspection no signs of respiratory distress, breathing rate appears normal, no obvious gross SOB, gasping or wheezing CV: no obvious cyanosis MS: moves all visible extremities without noticeable abnormality PSYCH/NEURO: pleasant and cooperative, no obvious depression or anxiety, speech and thought processing grossly intact UA  2 + leukocytes   Urine culture pending   ASSESSMENT AND PLAN:  Discussed the following assessment and plan:    ICD-10-CM   1. Suspected UTI  R39.89    Empiric rx  Macrobid  Will inform with culture results when available.  Counseled.   Expectant management and discussion of plan and treatment with opportunity to ask questions and all were answered. The patient agreed with the plan and demonstrated an understanding of the instructions.   Advised to call back or seek an in-person evaluation if worsening  or having  further concerns .   Shanon Ace, MD

## 2018-11-16 DIAGNOSIS — Z23 Encounter for immunization: Secondary | ICD-10-CM | POA: Diagnosis not present

## 2018-11-17 LAB — URINE CULTURE
MICRO NUMBER:: 617149
Result:: NO GROWTH
SPECIMEN QUALITY:: ADEQUATE

## 2018-11-17 NOTE — Progress Notes (Signed)
No bacteria in urine  Not sure why   If still having sx after medication please see your GYNE or dr Ethlyn Gallery

## 2018-11-18 DIAGNOSIS — J3089 Other allergic rhinitis: Secondary | ICD-10-CM | POA: Diagnosis not present

## 2018-11-18 DIAGNOSIS — J3081 Allergic rhinitis due to animal (cat) (dog) hair and dander: Secondary | ICD-10-CM | POA: Diagnosis not present

## 2018-11-18 DIAGNOSIS — J301 Allergic rhinitis due to pollen: Secondary | ICD-10-CM | POA: Diagnosis not present

## 2018-11-22 DIAGNOSIS — J3081 Allergic rhinitis due to animal (cat) (dog) hair and dander: Secondary | ICD-10-CM | POA: Diagnosis not present

## 2018-11-22 DIAGNOSIS — J3089 Other allergic rhinitis: Secondary | ICD-10-CM | POA: Diagnosis not present

## 2018-11-22 DIAGNOSIS — J301 Allergic rhinitis due to pollen: Secondary | ICD-10-CM | POA: Diagnosis not present

## 2018-11-28 ENCOUNTER — Encounter: Payer: Self-pay | Admitting: Family Medicine

## 2018-11-29 ENCOUNTER — Ambulatory Visit: Payer: BC Managed Care – PPO | Admitting: Family Medicine

## 2018-11-29 DIAGNOSIS — J301 Allergic rhinitis due to pollen: Secondary | ICD-10-CM | POA: Diagnosis not present

## 2018-11-29 DIAGNOSIS — J3089 Other allergic rhinitis: Secondary | ICD-10-CM | POA: Diagnosis not present

## 2018-11-29 DIAGNOSIS — J3081 Allergic rhinitis due to animal (cat) (dog) hair and dander: Secondary | ICD-10-CM | POA: Diagnosis not present

## 2018-11-29 NOTE — Telephone Encounter (Signed)
Her last urine culture was completely negative for bacteria. Leuks were positive then too. There could be vaginal infection causing this. I would suggest gyn exam (she has gynecologist but we could do as well) with testing to make sure no vaginal infection. I am hesitant to treat with antibiotics when I don't know source of infection.

## 2018-11-29 NOTE — Telephone Encounter (Signed)
Patient called back and stated she cannot see the GYN until Thursday. Patient stated her temperature is 97.7, she overall feels sick, urinary tract feels irritated, complains of dysuria and a "prickly sensation" and declines vaginal discharge and doesn't feel its in her vagina.  Per Dr Ethlyn Gallery I advised the pt she thinks it is ok for her to wait for gyn appointment. Advised pt per Dr Ethlyn Gallery, if she hasn't tried Azo, she recommends this. Also advised to avoid acidic foods, drinks, alcohol, caffeine as these can further irritate bladder. Drink a lot of water. Dr Ethlyn Gallery stated she is glad there is no vaginal discharge. She offered to repeat a urine culture if pt  would like (can drop that off today!) and then if not better and she could have results Wednesday and she could fit her in at end of day.  Or she could schedule a virtual visit with Dr Maudie Mercury tomorrow to discuss this further.  Patient stated she will try AZO and await GYN appt.

## 2018-11-29 NOTE — Telephone Encounter (Signed)
I called the pt and informed her of the message below.  patient agreed to call her GYN and the appt today was cancelled for today with Dr Volanda Napoleon.

## 2018-11-30 DIAGNOSIS — R1032 Left lower quadrant pain: Secondary | ICD-10-CM | POA: Diagnosis not present

## 2018-11-30 DIAGNOSIS — R300 Dysuria: Secondary | ICD-10-CM | POA: Diagnosis not present

## 2018-11-30 DIAGNOSIS — N898 Other specified noninflammatory disorders of vagina: Secondary | ICD-10-CM | POA: Diagnosis not present

## 2018-12-02 ENCOUNTER — Encounter: Payer: Self-pay | Admitting: Family Medicine

## 2018-12-03 ENCOUNTER — Other Ambulatory Visit: Payer: Self-pay | Admitting: Family Medicine

## 2018-12-03 DIAGNOSIS — J3089 Other allergic rhinitis: Secondary | ICD-10-CM | POA: Diagnosis not present

## 2018-12-03 DIAGNOSIS — J3081 Allergic rhinitis due to animal (cat) (dog) hair and dander: Secondary | ICD-10-CM | POA: Diagnosis not present

## 2018-12-03 DIAGNOSIS — J301 Allergic rhinitis due to pollen: Secondary | ICD-10-CM | POA: Diagnosis not present

## 2018-12-03 MED ORDER — TRAZODONE HCL 50 MG PO TABS: 25.0000 mg | ORAL_TABLET | Freq: Every evening | ORAL | 1 refills | Status: DC | PRN

## 2018-12-07 DIAGNOSIS — J3089 Other allergic rhinitis: Secondary | ICD-10-CM | POA: Diagnosis not present

## 2018-12-07 DIAGNOSIS — R1032 Left lower quadrant pain: Secondary | ICD-10-CM | POA: Diagnosis not present

## 2018-12-07 DIAGNOSIS — J301 Allergic rhinitis due to pollen: Secondary | ICD-10-CM | POA: Diagnosis not present

## 2018-12-07 DIAGNOSIS — J3081 Allergic rhinitis due to animal (cat) (dog) hair and dander: Secondary | ICD-10-CM | POA: Diagnosis not present

## 2018-12-09 DIAGNOSIS — J3089 Other allergic rhinitis: Secondary | ICD-10-CM | POA: Diagnosis not present

## 2018-12-09 DIAGNOSIS — J301 Allergic rhinitis due to pollen: Secondary | ICD-10-CM | POA: Diagnosis not present

## 2018-12-09 DIAGNOSIS — J3081 Allergic rhinitis due to animal (cat) (dog) hair and dander: Secondary | ICD-10-CM | POA: Diagnosis not present

## 2018-12-14 DIAGNOSIS — J3081 Allergic rhinitis due to animal (cat) (dog) hair and dander: Secondary | ICD-10-CM | POA: Diagnosis not present

## 2018-12-14 DIAGNOSIS — J301 Allergic rhinitis due to pollen: Secondary | ICD-10-CM | POA: Diagnosis not present

## 2018-12-14 DIAGNOSIS — J3089 Other allergic rhinitis: Secondary | ICD-10-CM | POA: Diagnosis not present

## 2018-12-17 DIAGNOSIS — J3089 Other allergic rhinitis: Secondary | ICD-10-CM | POA: Diagnosis not present

## 2018-12-17 DIAGNOSIS — J301 Allergic rhinitis due to pollen: Secondary | ICD-10-CM | POA: Diagnosis not present

## 2018-12-17 DIAGNOSIS — J3081 Allergic rhinitis due to animal (cat) (dog) hair and dander: Secondary | ICD-10-CM | POA: Diagnosis not present

## 2018-12-21 DIAGNOSIS — J3081 Allergic rhinitis due to animal (cat) (dog) hair and dander: Secondary | ICD-10-CM | POA: Diagnosis not present

## 2018-12-21 DIAGNOSIS — J3089 Other allergic rhinitis: Secondary | ICD-10-CM | POA: Diagnosis not present

## 2018-12-21 DIAGNOSIS — J301 Allergic rhinitis due to pollen: Secondary | ICD-10-CM | POA: Diagnosis not present

## 2018-12-24 DIAGNOSIS — J3089 Other allergic rhinitis: Secondary | ICD-10-CM | POA: Diagnosis not present

## 2018-12-24 DIAGNOSIS — J301 Allergic rhinitis due to pollen: Secondary | ICD-10-CM | POA: Diagnosis not present

## 2018-12-28 DIAGNOSIS — J301 Allergic rhinitis due to pollen: Secondary | ICD-10-CM | POA: Diagnosis not present

## 2018-12-28 DIAGNOSIS — J3089 Other allergic rhinitis: Secondary | ICD-10-CM | POA: Diagnosis not present

## 2018-12-28 DIAGNOSIS — J3081 Allergic rhinitis due to animal (cat) (dog) hair and dander: Secondary | ICD-10-CM | POA: Diagnosis not present

## 2018-12-30 ENCOUNTER — Ambulatory Visit (INDEPENDENT_AMBULATORY_CARE_PROVIDER_SITE_OTHER)
Admission: RE | Admit: 2018-12-30 | Discharge: 2018-12-30 | Disposition: A | Discharge status: Home / Self Care | Payer: BC Managed Care – PPO | Source: Ambulatory Visit

## 2018-12-30 DIAGNOSIS — T148XXA Other injury of unspecified body region, initial encounter: Secondary | ICD-10-CM | POA: Diagnosis not present

## 2018-12-30 DIAGNOSIS — J3081 Allergic rhinitis due to animal (cat) (dog) hair and dander: Secondary | ICD-10-CM | POA: Diagnosis not present

## 2018-12-30 DIAGNOSIS — W57XXXA Bitten or stung by nonvenomous insect and other nonvenomous arthropods, initial encounter: Secondary | ICD-10-CM

## 2018-12-30 DIAGNOSIS — J301 Allergic rhinitis due to pollen: Secondary | ICD-10-CM | POA: Diagnosis not present

## 2018-12-30 DIAGNOSIS — J3089 Other allergic rhinitis: Secondary | ICD-10-CM | POA: Diagnosis not present

## 2018-12-30 MED ORDER — DOXYCYCLINE HYCLATE 100 MG PO CAPS: 100.0000 mg | ORAL_CAPSULE | Freq: Two times a day (BID) | ORAL | 0 refills | Status: AC

## 2018-12-30 MED ORDER — TRIAMCINOLONE ACETONIDE 0.1 % EX CREA: 1.0000 "application " | TOPICAL_CREAM | Freq: Two times a day (BID) | CUTANEOUS | 0 refills | Status: DC

## 2018-12-30 NOTE — Discharge Instructions (Signed)
Doxycyline twice daily for 10 days  Triamcinolone twice daily for itching  Follow up if not resolving or itching

## 2018-12-30 NOTE — ED Provider Notes (Signed)
Virtual Visit via Video Note:  Sonia [name]  initiated request for Telemedicine visit with [facility] team. I connected with Sonia [name]  on 12/30/2018 at 6:57 PM  for a synchronized telemedicine visit using a video enabled HIPPA compliant telemedicine application. I verified that I am speaking with Sonia [name]  using two identifiers. Hallie C Wieters, PA-C  was physically located in a [place] [facility] site and Helen was located at a different location.   The limitations of evaluation and management by telemedicine as well as the availability of in-person appointments were discussed. Patient was informed that she  may incur a bill ( including co-pay) for this virtual visit encounter. Sonia [name]  expressed understanding and gave verbal consent to proceed with virtual visit.     History of Present Illness:Sonia [name]  is a 33 y.o. female presents for evaluation of bug bite and possible infection.  Patient states that she recently restarted getting allergy shots and since she feels when she has bug bites she developed strong reactions to them.  She notes this mainly when she is bug bites to her feet.  She states that over the past couple days she has developed some spots on her lower leg.  She has one on her right ankle area which she is concerned about it being infected.  She states that she has had some clear drainage.  States that it is raised.  Has had associated itching as well as pain.  Denies fevers chills or body aches.   No Known Allergies   Past Medical History:  Diagnosis Date  . Crohn's colitis ([place])   . GERD (gastroesophageal reflux disease)    no meds  . Headache(784.0)   . Normal labor 03/27/2013  . Postpartum care following cesarean delivery (11/9) 03/27/2013     Social History   Tobacco Use  . Smoking status: Never Smoker  . Smokeless tobacco:  Former Systems developer    Types: Chew  Substance Use Topics  . Alcohol use: No    Comment: occasionally  . Drug use: No        Observations/Objective: Physical Exam  Constitutional: She is well-developed, well-nourished, and in no distress. No distress.  Pulmonary/Chest: Effort normal. No respiratory distress.  Speaking in full sentences  Musculoskeletal:     Comments: Walking and house without abnormality  Neurological:  Speech clear, face symmetric  Skin:  Lesion to lower leg appears circular and erythematous, unclear if central area of scabbing or depressed, no significant spreading erythema  Smaller erythematous lesions noted elsewhere on lower leg/foot     Assessment and Plan:    ICD-10-CM   1. Bug bite, initial encounter  [email or web address].Merril Abbe      We will provide triamcinolone cream to apply twice daily to areas of bug bites help with itching and local inflammation.  Will empirically treat for infection with doxycycline.  Continue to monitor for resolution of these lesions.Discussed strict return precautions. Patient verbalized understanding and is agreeable with plan.   Follow Up Instructions:     I discussed the assessment and treatment plan with the patient. The patient was provided an opportunity to ask questions and all were answered. The patient agreed with the plan and demonstrated an understanding of the instructions.   The patient was advised to call back or seek an in-person evaluation if the symptoms worsen or if the condition fails to improve as anticipated.  Janith Lima, PA-C  12/30/2018 6:57 PM         Janith Lima, PA-C 12/30/18 1900

## 2019-01-03 DIAGNOSIS — J301 Allergic rhinitis due to pollen: Secondary | ICD-10-CM | POA: Diagnosis not present

## 2019-01-03 DIAGNOSIS — J3081 Allergic rhinitis due to animal (cat) (dog) hair and dander: Secondary | ICD-10-CM | POA: Diagnosis not present

## 2019-01-03 DIAGNOSIS — J3089 Other allergic rhinitis: Secondary | ICD-10-CM | POA: Diagnosis not present

## 2019-01-10 DIAGNOSIS — J3081 Allergic rhinitis due to animal (cat) (dog) hair and dander: Secondary | ICD-10-CM | POA: Diagnosis not present

## 2019-01-10 DIAGNOSIS — J3089 Other allergic rhinitis: Secondary | ICD-10-CM | POA: Diagnosis not present

## 2019-01-10 DIAGNOSIS — J301 Allergic rhinitis due to pollen: Secondary | ICD-10-CM | POA: Diagnosis not present

## 2019-01-14 DIAGNOSIS — J301 Allergic rhinitis due to pollen: Secondary | ICD-10-CM | POA: Diagnosis not present

## 2019-01-14 DIAGNOSIS — J3089 Other allergic rhinitis: Secondary | ICD-10-CM | POA: Diagnosis not present

## 2019-01-14 DIAGNOSIS — J3081 Allergic rhinitis due to animal (cat) (dog) hair and dander: Secondary | ICD-10-CM | POA: Diagnosis not present

## 2019-01-17 DIAGNOSIS — J3081 Allergic rhinitis due to animal (cat) (dog) hair and dander: Secondary | ICD-10-CM | POA: Diagnosis not present

## 2019-01-17 DIAGNOSIS — J301 Allergic rhinitis due to pollen: Secondary | ICD-10-CM | POA: Diagnosis not present

## 2019-01-17 DIAGNOSIS — J3089 Other allergic rhinitis: Secondary | ICD-10-CM | POA: Diagnosis not present

## 2019-01-20 DIAGNOSIS — J3089 Other allergic rhinitis: Secondary | ICD-10-CM | POA: Diagnosis not present

## 2019-01-20 DIAGNOSIS — J3081 Allergic rhinitis due to animal (cat) (dog) hair and dander: Secondary | ICD-10-CM | POA: Diagnosis not present

## 2019-01-20 DIAGNOSIS — J301 Allergic rhinitis due to pollen: Secondary | ICD-10-CM | POA: Diagnosis not present

## 2019-01-25 DIAGNOSIS — J3081 Allergic rhinitis due to animal (cat) (dog) hair and dander: Secondary | ICD-10-CM | POA: Diagnosis not present

## 2019-01-25 DIAGNOSIS — J301 Allergic rhinitis due to pollen: Secondary | ICD-10-CM | POA: Diagnosis not present

## 2019-01-31 DIAGNOSIS — J301 Allergic rhinitis due to pollen: Secondary | ICD-10-CM | POA: Diagnosis not present

## 2019-01-31 DIAGNOSIS — J3089 Other allergic rhinitis: Secondary | ICD-10-CM | POA: Diagnosis not present

## 2019-01-31 DIAGNOSIS — J3081 Allergic rhinitis due to animal (cat) (dog) hair and dander: Secondary | ICD-10-CM | POA: Diagnosis not present

## 2019-02-04 DIAGNOSIS — J301 Allergic rhinitis due to pollen: Secondary | ICD-10-CM | POA: Diagnosis not present

## 2019-02-04 DIAGNOSIS — J3081 Allergic rhinitis due to animal (cat) (dog) hair and dander: Secondary | ICD-10-CM | POA: Diagnosis not present

## 2019-02-04 DIAGNOSIS — J3089 Other allergic rhinitis: Secondary | ICD-10-CM | POA: Diagnosis not present

## 2019-02-09 ENCOUNTER — Other Ambulatory Visit: Payer: Self-pay | Admitting: Family Medicine

## 2019-02-09 DIAGNOSIS — J3081 Allergic rhinitis due to animal (cat) (dog) hair and dander: Secondary | ICD-10-CM | POA: Diagnosis not present

## 2019-02-09 DIAGNOSIS — J301 Allergic rhinitis due to pollen: Secondary | ICD-10-CM | POA: Diagnosis not present

## 2019-02-09 DIAGNOSIS — J3089 Other allergic rhinitis: Secondary | ICD-10-CM | POA: Diagnosis not present

## 2019-02-09 DIAGNOSIS — F419 Anxiety disorder, unspecified: Secondary | ICD-10-CM

## 2019-02-09 MED ORDER — ESCITALOPRAM OXALATE 10 MG PO TABS: 10.0000 mg | ORAL_TABLET | Freq: Every day | ORAL | 0 refills | Status: DC

## 2019-02-11 ENCOUNTER — Encounter: Payer: Self-pay | Admitting: *Deleted

## 2019-02-11 ENCOUNTER — Other Ambulatory Visit: Payer: Self-pay

## 2019-02-11 ENCOUNTER — Encounter: Payer: Self-pay | Admitting: Family Medicine

## 2019-02-11 ENCOUNTER — Telehealth (INDEPENDENT_AMBULATORY_CARE_PROVIDER_SITE_OTHER): Payer: BC Managed Care – PPO | Admitting: Family Medicine

## 2019-02-11 DIAGNOSIS — F321 Major depressive disorder, single episode, moderate: Secondary | ICD-10-CM | POA: Diagnosis not present

## 2019-02-11 DIAGNOSIS — J3089 Other allergic rhinitis: Secondary | ICD-10-CM | POA: Diagnosis not present

## 2019-02-11 DIAGNOSIS — Z862 Personal history of diseases of the blood and blood-forming organs and certain disorders involving the immune mechanism: Secondary | ICD-10-CM

## 2019-02-11 DIAGNOSIS — F419 Anxiety disorder, unspecified: Secondary | ICD-10-CM

## 2019-02-11 DIAGNOSIS — R5383 Other fatigue: Secondary | ICD-10-CM | POA: Diagnosis not present

## 2019-02-11 DIAGNOSIS — J3081 Allergic rhinitis due to animal (cat) (dog) hair and dander: Secondary | ICD-10-CM | POA: Diagnosis not present

## 2019-02-11 DIAGNOSIS — J301 Allergic rhinitis due to pollen: Secondary | ICD-10-CM | POA: Diagnosis not present

## 2019-02-11 NOTE — Progress Notes (Signed)
Virtual Visit via Video Note  I connected with Sonia [name] on 02/11/19 at  9:30 AM EDT by a video enabled telemedicine application and verified that I am speaking with the correct person using two identifiers.  Location patient: home Location provider:work or home office Persons participating in the virtual visit: patient, provider  I discussed the limitations of evaluation and management by telemedicine and the availability of in person appointments. The patient expressed understanding and agreed to proceed.   Sonia [name] DOB: November 01, 1985 Encounter date: 02/11/2019  This is a 33 y.o. female who presents with Chief Complaint  Patient presents with  . Follow-up    History of present illness: Last visit was 08/2018. We started lexapro for anxiety and depression. We discussed triggers for mood as well. We discussed meeting with therapist.  Things have been "good". Lexapro helped for a long time, but then felt like she just plummeted all of a sudden. More overwhelming sadness - comes and goes. Has noted this for a month. Disconnected completely from husbands family. Thinks that they were part of problem and feels better with this.   Relationship with husband going well. Not fighting, not arguing.   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 Kids are comforting for her.   Hasn't met with therapist.   Has not been making self throw up. Feels like she has gained some weight, but doesn't weigh self at home. Tries to walk, go to park for exercise.   Doesn't feel like work is source of stress.   Energy is really low. When she started lexapro felt like she had boost of energy. Now feels like this is really low. This started around time of feeling more depressed. Still waking up through night. Has boys sleep with her. Feels comforted when they are sleeping with her. Seems to help with thoughts in head.   Not completing  tasks when she is doing them. This is at home or work. Notes with kids, notes at work. Worries about things that she has no control over; thinks of worst possible scenario. Thoughts come through frequently during day.     Allergies  Allergen Reactions  . Other Other (See Comments)    Mold, mildew, grass, fungus, trees cause swelling of eyes   Current Meds  Medication Sig  . budesonide-formoterol (SYMBICORT) 80-4.5 MCG/ACT inhaler Inhale 2 puffs into the lungs 2 (two) times a day.  . cetirizine (ZYRTEC) 10 MG tablet Take 10 mg by mouth daily.  Marland Kitchen dicyclomine (BENTYL) 10 MG capsule Take 1 capsule (10 mg total) by mouth 3 (three) times daily before meals.  . diphenhydrAMINE (BENADRYL) 25 mg capsule Take 25 mg by mouth at bedtime as needed for allergies. For itching  . escitalopram (LEXAPRO) 10 MG tablet Take 1 tablet (10 mg total) by mouth daily.  . fluticasone (FLONASE) 50 MCG/ACT nasal spray Place 1 spray into both nostrils daily as needed for allergies or rhinitis.  Marland Kitchen montelukast (SINGULAIR) 10 MG tablet Take 10 mg by mouth at bedtime.  Donnetta Hail Estradiol (SPRINTEC 28 PO) Take by mouth.  . traZODone (DESYREL) 50 MG tablet Take 0.5-1 tablets (25-50 mg total) by mouth at bedtime as needed for sleep.  Marland Kitchen triamcinolone cream (KENALOG) 0.1 % Apply 1 application topically 2 (two) times daily.    Review of Systems  Constitutional: Negative for chills, fatigue and fever.  Respiratory: Negative for cough, chest tightness, shortness of breath and  wheezing.   Cardiovascular: Negative for chest pain, palpitations and leg swelling.  Psychiatric/Behavioral: Positive for decreased concentration and sleep disturbance. Negative for suicidal ideas. The patient is nervous/anxious.     Objective: PHQ: Little interest/pleasure: 1 Down depressed hopeless: 2 Sleep: 3 Tired: 3 Poor appetite/overeating: 1 Feeling bad about yourself: 2 Trouble concentration: 3 (overthinking, distracted) Moving  slowly/restless: 0 Thoughts of hurting self: 0      There were no vitals taken for this visit.      BP Readings from Last 3 Encounters:  03/01/18 110/80  01/15/18 120/64  08/30/15 102/67   Wt Readings from Last 3 Encounters:  03/01/18 132 lb 4 oz (60 kg)  01/15/18 134 lb 3.2 oz (60.9 kg)  08/30/15 137 lb (62.1 kg)    EXAM:  GENERAL: alert, oriented, appears well and in no acute distress  HEENT: atraumatic, conjunctiva clear, no obvious abnormalities on inspection of external nose and ears  NECK: normal movements of the head and neck  LUNGS: on inspection no signs of respiratory distress, breathing rate appears normal, no obvious gross SOB, gasping or wheezing  CV: no obvious cyanosis  MS: moves all visible extremities without noticeable abnormality  PSYCH/NEURO: pleasant and cooperative, no obvious depression or anxiety, speech and thought processing grossly intact   Assessment/Plan  1. Anxiety Would like some improved control with worries and with depressed mood. We are going to start with some bloodwork. Encouraged her again to consider counseling. See below.  2. Moderate major depression ([place]) She is interested in adding on Wellbutrin to the Lexapro to help with depressed mood.  We will start with blood work and just make sure there is nothing that would be contributing to fatigue at this point, but if lab work looks stable we can start Wellbutrin. Discussed new medication(s) today with patient. Discussed potential side effects and patient verbalized understanding.   3. History of anemia - CBC with Differential/Platelet; Future - IBC + Ferritin; Future  4. Fatigue, unspecified type - CBC with Differential/Platelet; Future - Comprehensive metabolic panel; Future - TSH; Future - Vitamin B12; Future - VITAMIN D 25 Hydroxy (Vit-D Deficiency, Fractures); Future  Return for pending bloodwork results. (lab visit was scheduled during encounter)   I discussed  the assessment and treatment plan with the patient. The patient was provided an opportunity to ask questions and all were answered. The patient agreed with the plan and demonstrated an understanding of the instructions.   The patient was advised to call back or seek an in-person evaluation if the symptoms worsen or if the condition fails to improve as anticipated.  I provided 25 minutes of non-face-to-face time during this encounter.   Micheline Rough, MD

## 2019-02-16 ENCOUNTER — Other Ambulatory Visit (INDEPENDENT_AMBULATORY_CARE_PROVIDER_SITE_OTHER): Payer: BC Managed Care – PPO

## 2019-02-16 ENCOUNTER — Ambulatory Visit: Payer: BC Managed Care – PPO

## 2019-02-16 ENCOUNTER — Other Ambulatory Visit: Payer: Self-pay

## 2019-02-16 DIAGNOSIS — R5383 Other fatigue: Secondary | ICD-10-CM

## 2019-02-16 DIAGNOSIS — Z862 Personal history of diseases of the blood and blood-forming organs and certain disorders involving the immune mechanism: Secondary | ICD-10-CM

## 2019-02-16 DIAGNOSIS — Z23 Encounter for immunization: Secondary | ICD-10-CM | POA: Diagnosis not present

## 2019-02-16 DIAGNOSIS — J3081 Allergic rhinitis due to animal (cat) (dog) hair and dander: Secondary | ICD-10-CM | POA: Diagnosis not present

## 2019-02-16 DIAGNOSIS — J3089 Other allergic rhinitis: Secondary | ICD-10-CM | POA: Diagnosis not present

## 2019-02-16 DIAGNOSIS — J301 Allergic rhinitis due to pollen: Secondary | ICD-10-CM | POA: Diagnosis not present

## 2019-02-17 LAB — COMPREHENSIVE METABOLIC PANEL
ALT: 12 U/L (ref 0–35)
AST: 10 U/L (ref 0–37)
Albumin: 4.1 g/dL (ref 3.5–5.2)
Alkaline Phosphatase: 19 U/L — ABNORMAL LOW (ref 39–117)
BUN: 13 mg/dL (ref 6–23)
CO2: 28 mEq/L (ref 19–32)
Calcium: 9.6 mg/dL (ref 8.4–10.5)
Chloride: 100 mEq/L (ref 96–112)
Creatinine, Ser: 0.82 mg/dL (ref 0.40–1.20)
GFR: 80.24 mL/min (ref >60.00)
Glucose, Bld: 99 mg/dL (ref 70–99)
Potassium: 4.1 mEq/L (ref 3.5–5.1)
Sodium: 136 mEq/L (ref 135–145)
Total Bilirubin: 0.4 mg/dL (ref 0.2–1.2)
Total Protein: 6.4 g/dL (ref 6.0–8.3)

## 2019-02-17 LAB — IBC + FERRITIN
Ferritin: 11.6 ng/mL (ref 10.0–291.0)
Iron: 135 ug/dL (ref 42–145)
Saturation Ratios: 33.1 % (ref 20.0–50.0)
Transferrin: 291 mg/dL (ref 212.0–360.0)

## 2019-02-17 LAB — CBC WITH DIFFERENTIAL/PLATELET
Basophils Absolute: 0.1 10*3/uL (ref 0.0–0.1)
Basophils Relative: 1 % (ref 0.0–3.0)
Eosinophils Absolute: 0.2 10*3/uL (ref 0.0–0.7)
Eosinophils Relative: 2.5 % (ref 0.0–5.0)
HCT: 34.8 % — ABNORMAL LOW (ref 36.0–46.0)
Hemoglobin: 11.4 g/dL — ABNORMAL LOW (ref 12.0–15.0)
Lymphocytes Relative: 23.8 % (ref 12.0–46.0)
Lymphs Abs: 1.8 10*3/uL (ref 0.7–4.0)
MCHC: 32.7 g/dL (ref 30.0–36.0)
MCV: 87.3 fl (ref 78.0–100.0)
Monocytes Absolute: 0.6 10*3/uL (ref 0.1–1.0)
Monocytes Relative: 7.9 % (ref 3.0–12.0)
Neutro Abs: 5 10*3/uL (ref 1.4–7.7)
Neutrophils Relative %: 64.8 % (ref 43.0–77.0)
Platelets: 326 10*3/uL (ref 150.0–400.0)
RBC: 3.99 Mil/uL (ref 3.87–5.11)
RDW: 13.5 % (ref 11.5–15.5)
WBC: 7.6 10*3/uL (ref 4.0–10.5)

## 2019-02-17 LAB — VITAMIN D 25 HYDROXY (VIT D DEFICIENCY, FRACTURES): VITD: 29.48 ng/mL — ABNORMAL LOW (ref 30.00–100.00)

## 2019-02-17 LAB — TSH: TSH: 0.7 u[IU]/mL (ref 0.35–4.50)

## 2019-02-17 LAB — VITAMIN B12: Vitamin B-12: 225 pg/mL (ref 211–911)

## 2019-02-18 DIAGNOSIS — J301 Allergic rhinitis due to pollen: Secondary | ICD-10-CM | POA: Diagnosis not present

## 2019-02-18 DIAGNOSIS — J3081 Allergic rhinitis due to animal (cat) (dog) hair and dander: Secondary | ICD-10-CM | POA: Diagnosis not present

## 2019-02-18 DIAGNOSIS — J3089 Other allergic rhinitis: Secondary | ICD-10-CM | POA: Diagnosis not present

## 2019-02-23 ENCOUNTER — Encounter: Payer: Self-pay | Admitting: Family Medicine

## 2019-02-23 DIAGNOSIS — J3081 Allergic rhinitis due to animal (cat) (dog) hair and dander: Secondary | ICD-10-CM | POA: Diagnosis not present

## 2019-02-23 DIAGNOSIS — J3089 Other allergic rhinitis: Secondary | ICD-10-CM | POA: Diagnosis not present

## 2019-02-23 DIAGNOSIS — J301 Allergic rhinitis due to pollen: Secondary | ICD-10-CM | POA: Diagnosis not present

## 2019-02-24 ENCOUNTER — Other Ambulatory Visit: Payer: Self-pay | Admitting: Family Medicine

## 2019-02-24 MED ORDER — BUPROPION HCL ER (SR) 150 MG PO TB12: 150.0000 mg | ORAL_TABLET | Freq: Two times a day (BID) | ORAL | 2 refills | Status: DC

## 2019-02-25 DIAGNOSIS — J3089 Other allergic rhinitis: Secondary | ICD-10-CM | POA: Diagnosis not present

## 2019-02-25 DIAGNOSIS — J3081 Allergic rhinitis due to animal (cat) (dog) hair and dander: Secondary | ICD-10-CM | POA: Diagnosis not present

## 2019-02-25 DIAGNOSIS — J301 Allergic rhinitis due to pollen: Secondary | ICD-10-CM | POA: Diagnosis not present

## 2019-03-02 DIAGNOSIS — J301 Allergic rhinitis due to pollen: Secondary | ICD-10-CM | POA: Diagnosis not present

## 2019-03-02 DIAGNOSIS — J3089 Other allergic rhinitis: Secondary | ICD-10-CM | POA: Diagnosis not present

## 2019-03-02 DIAGNOSIS — J3081 Allergic rhinitis due to animal (cat) (dog) hair and dander: Secondary | ICD-10-CM | POA: Diagnosis not present

## 2019-03-07 DIAGNOSIS — J3081 Allergic rhinitis due to animal (cat) (dog) hair and dander: Secondary | ICD-10-CM | POA: Diagnosis not present

## 2019-03-07 DIAGNOSIS — J301 Allergic rhinitis due to pollen: Secondary | ICD-10-CM | POA: Diagnosis not present

## 2019-03-07 DIAGNOSIS — J3089 Other allergic rhinitis: Secondary | ICD-10-CM | POA: Diagnosis not present

## 2019-03-10 DIAGNOSIS — J3081 Allergic rhinitis due to animal (cat) (dog) hair and dander: Secondary | ICD-10-CM | POA: Diagnosis not present

## 2019-03-10 DIAGNOSIS — J3089 Other allergic rhinitis: Secondary | ICD-10-CM | POA: Diagnosis not present

## 2019-03-10 DIAGNOSIS — J301 Allergic rhinitis due to pollen: Secondary | ICD-10-CM | POA: Diagnosis not present

## 2019-03-14 DIAGNOSIS — J301 Allergic rhinitis due to pollen: Secondary | ICD-10-CM | POA: Diagnosis not present

## 2019-03-14 DIAGNOSIS — J3081 Allergic rhinitis due to animal (cat) (dog) hair and dander: Secondary | ICD-10-CM | POA: Diagnosis not present

## 2019-03-14 DIAGNOSIS — J3089 Other allergic rhinitis: Secondary | ICD-10-CM | POA: Diagnosis not present

## 2019-03-18 ENCOUNTER — Other Ambulatory Visit: Payer: Self-pay | Admitting: Family Medicine

## 2019-03-18 DIAGNOSIS — J3081 Allergic rhinitis due to animal (cat) (dog) hair and dander: Secondary | ICD-10-CM | POA: Diagnosis not present

## 2019-03-18 DIAGNOSIS — J301 Allergic rhinitis due to pollen: Secondary | ICD-10-CM | POA: Diagnosis not present

## 2019-03-18 DIAGNOSIS — J3089 Other allergic rhinitis: Secondary | ICD-10-CM | POA: Diagnosis not present

## 2019-03-22 DIAGNOSIS — J301 Allergic rhinitis due to pollen: Secondary | ICD-10-CM | POA: Diagnosis not present

## 2019-03-22 DIAGNOSIS — J3081 Allergic rhinitis due to animal (cat) (dog) hair and dander: Secondary | ICD-10-CM | POA: Diagnosis not present

## 2019-03-22 DIAGNOSIS — J3089 Other allergic rhinitis: Secondary | ICD-10-CM | POA: Diagnosis not present

## 2019-03-25 DIAGNOSIS — J301 Allergic rhinitis due to pollen: Secondary | ICD-10-CM | POA: Diagnosis not present

## 2019-03-25 DIAGNOSIS — J3081 Allergic rhinitis due to animal (cat) (dog) hair and dander: Secondary | ICD-10-CM | POA: Diagnosis not present

## 2019-03-25 DIAGNOSIS — J3089 Other allergic rhinitis: Secondary | ICD-10-CM | POA: Diagnosis not present

## 2019-03-28 DIAGNOSIS — J3089 Other allergic rhinitis: Secondary | ICD-10-CM | POA: Diagnosis not present

## 2019-03-28 DIAGNOSIS — J301 Allergic rhinitis due to pollen: Secondary | ICD-10-CM | POA: Diagnosis not present

## 2019-03-28 DIAGNOSIS — J3081 Allergic rhinitis due to animal (cat) (dog) hair and dander: Secondary | ICD-10-CM | POA: Diagnosis not present

## 2019-03-30 DIAGNOSIS — Z1151 Encounter for screening for human papillomavirus (HPV): Secondary | ICD-10-CM | POA: Diagnosis not present

## 2019-03-30 DIAGNOSIS — R87613 High grade squamous intraepithelial lesion on cytologic smear of cervix (HGSIL): Secondary | ICD-10-CM | POA: Diagnosis not present

## 2019-03-30 DIAGNOSIS — Z124 Encounter for screening for malignant neoplasm of cervix: Secondary | ICD-10-CM | POA: Diagnosis not present

## 2019-03-31 DIAGNOSIS — J3089 Other allergic rhinitis: Secondary | ICD-10-CM | POA: Diagnosis not present

## 2019-03-31 DIAGNOSIS — J301 Allergic rhinitis due to pollen: Secondary | ICD-10-CM | POA: Diagnosis not present

## 2019-03-31 DIAGNOSIS — J3081 Allergic rhinitis due to animal (cat) (dog) hair and dander: Secondary | ICD-10-CM | POA: Diagnosis not present

## 2019-04-04 DIAGNOSIS — H1045 Other chronic allergic conjunctivitis: Secondary | ICD-10-CM | POA: Diagnosis not present

## 2019-04-04 DIAGNOSIS — J3081 Allergic rhinitis due to animal (cat) (dog) hair and dander: Secondary | ICD-10-CM | POA: Diagnosis not present

## 2019-04-04 DIAGNOSIS — J301 Allergic rhinitis due to pollen: Secondary | ICD-10-CM | POA: Diagnosis not present

## 2019-04-04 DIAGNOSIS — J3089 Other allergic rhinitis: Secondary | ICD-10-CM | POA: Diagnosis not present

## 2019-04-11 DIAGNOSIS — J3089 Other allergic rhinitis: Secondary | ICD-10-CM | POA: Diagnosis not present

## 2019-04-11 DIAGNOSIS — J3081 Allergic rhinitis due to animal (cat) (dog) hair and dander: Secondary | ICD-10-CM | POA: Diagnosis not present

## 2019-04-11 DIAGNOSIS — J301 Allergic rhinitis due to pollen: Secondary | ICD-10-CM | POA: Diagnosis not present

## 2019-04-18 DIAGNOSIS — J301 Allergic rhinitis due to pollen: Secondary | ICD-10-CM | POA: Diagnosis not present

## 2019-04-18 DIAGNOSIS — J3089 Other allergic rhinitis: Secondary | ICD-10-CM | POA: Diagnosis not present

## 2019-04-18 DIAGNOSIS — J3081 Allergic rhinitis due to animal (cat) (dog) hair and dander: Secondary | ICD-10-CM | POA: Diagnosis not present

## 2019-04-25 DIAGNOSIS — J301 Allergic rhinitis due to pollen: Secondary | ICD-10-CM | POA: Diagnosis not present

## 2019-04-25 DIAGNOSIS — J3081 Allergic rhinitis due to animal (cat) (dog) hair and dander: Secondary | ICD-10-CM | POA: Diagnosis not present

## 2019-04-25 DIAGNOSIS — J3089 Other allergic rhinitis: Secondary | ICD-10-CM | POA: Diagnosis not present

## 2019-05-02 DIAGNOSIS — J301 Allergic rhinitis due to pollen: Secondary | ICD-10-CM | POA: Diagnosis not present

## 2019-05-02 DIAGNOSIS — J3081 Allergic rhinitis due to animal (cat) (dog) hair and dander: Secondary | ICD-10-CM | POA: Diagnosis not present

## 2019-05-02 DIAGNOSIS — J3089 Other allergic rhinitis: Secondary | ICD-10-CM | POA: Diagnosis not present

## 2019-05-09 DIAGNOSIS — J301 Allergic rhinitis due to pollen: Secondary | ICD-10-CM | POA: Diagnosis not present

## 2019-05-09 DIAGNOSIS — J3081 Allergic rhinitis due to animal (cat) (dog) hair and dander: Secondary | ICD-10-CM | POA: Diagnosis not present

## 2019-05-09 DIAGNOSIS — J3089 Other allergic rhinitis: Secondary | ICD-10-CM | POA: Diagnosis not present

## 2019-05-10 ENCOUNTER — Other Ambulatory Visit: Payer: Self-pay | Admitting: Family Medicine

## 2019-05-10 DIAGNOSIS — F419 Anxiety disorder, unspecified: Secondary | ICD-10-CM

## 2019-05-11 DIAGNOSIS — J3081 Allergic rhinitis due to animal (cat) (dog) hair and dander: Secondary | ICD-10-CM | POA: Diagnosis not present

## 2019-05-11 DIAGNOSIS — J3089 Other allergic rhinitis: Secondary | ICD-10-CM | POA: Diagnosis not present

## 2019-05-11 DIAGNOSIS — J301 Allergic rhinitis due to pollen: Secondary | ICD-10-CM | POA: Diagnosis not present

## 2019-05-12 ENCOUNTER — Other Ambulatory Visit: Payer: Self-pay | Admitting: Family Medicine

## 2019-05-12 DIAGNOSIS — F419 Anxiety disorder, unspecified: Secondary | ICD-10-CM

## 2019-05-12 MED ORDER — ESCITALOPRAM OXALATE 10 MG PO TABS: 10.0000 mg | ORAL_TABLET | Freq: Every day | ORAL | 1 refills | Status: DC

## 2019-05-16 DIAGNOSIS — J3081 Allergic rhinitis due to animal (cat) (dog) hair and dander: Secondary | ICD-10-CM | POA: Diagnosis not present

## 2019-05-16 DIAGNOSIS — J301 Allergic rhinitis due to pollen: Secondary | ICD-10-CM | POA: Diagnosis not present

## 2019-05-16 DIAGNOSIS — J3089 Other allergic rhinitis: Secondary | ICD-10-CM | POA: Diagnosis not present

## 2019-05-25 DIAGNOSIS — J301 Allergic rhinitis due to pollen: Secondary | ICD-10-CM | POA: Diagnosis not present

## 2019-05-25 DIAGNOSIS — J3089 Other allergic rhinitis: Secondary | ICD-10-CM | POA: Diagnosis not present

## 2019-05-25 DIAGNOSIS — J3081 Allergic rhinitis due to animal (cat) (dog) hair and dander: Secondary | ICD-10-CM | POA: Diagnosis not present

## 2019-05-31 DIAGNOSIS — J301 Allergic rhinitis due to pollen: Secondary | ICD-10-CM | POA: Diagnosis not present

## 2019-05-31 DIAGNOSIS — J3081 Allergic rhinitis due to animal (cat) (dog) hair and dander: Secondary | ICD-10-CM | POA: Diagnosis not present

## 2019-06-01 ENCOUNTER — Ambulatory Visit: Payer: BC Managed Care – PPO | Attending: Internal Medicine

## 2019-06-01 DIAGNOSIS — Z20822 Contact with and (suspected) exposure to covid-19: Secondary | ICD-10-CM | POA: Diagnosis not present

## 2019-06-01 NOTE — Addendum Note (Signed)
Addended by: Harrell Gave on: 06/01/2019 02:31 PM   Modules accepted: Orders

## 2019-06-02 ENCOUNTER — Other Ambulatory Visit: Payer: BC Managed Care – PPO

## 2019-06-02 DIAGNOSIS — J3089 Other allergic rhinitis: Secondary | ICD-10-CM | POA: Diagnosis not present

## 2019-06-02 DIAGNOSIS — J3081 Allergic rhinitis due to animal (cat) (dog) hair and dander: Secondary | ICD-10-CM | POA: Diagnosis not present

## 2019-06-02 DIAGNOSIS — J301 Allergic rhinitis due to pollen: Secondary | ICD-10-CM | POA: Diagnosis not present

## 2019-06-02 LAB — NOVEL CORONAVIRUS, NAA: SARS-CoV-2, NAA: NOT DETECTED

## 2019-06-07 DIAGNOSIS — J3089 Other allergic rhinitis: Secondary | ICD-10-CM | POA: Diagnosis not present

## 2019-06-07 DIAGNOSIS — J3081 Allergic rhinitis due to animal (cat) (dog) hair and dander: Secondary | ICD-10-CM | POA: Diagnosis not present

## 2019-06-07 DIAGNOSIS — J301 Allergic rhinitis due to pollen: Secondary | ICD-10-CM | POA: Diagnosis not present

## 2019-06-14 ENCOUNTER — Other Ambulatory Visit: Payer: Self-pay | Admitting: Family Medicine

## 2019-06-16 DIAGNOSIS — H1045 Other chronic allergic conjunctivitis: Secondary | ICD-10-CM | POA: Diagnosis not present

## 2019-06-16 DIAGNOSIS — J3081 Allergic rhinitis due to animal (cat) (dog) hair and dander: Secondary | ICD-10-CM | POA: Diagnosis not present

## 2019-06-16 DIAGNOSIS — J301 Allergic rhinitis due to pollen: Secondary | ICD-10-CM | POA: Diagnosis not present

## 2019-06-16 DIAGNOSIS — J3089 Other allergic rhinitis: Secondary | ICD-10-CM | POA: Diagnosis not present

## 2019-06-22 DIAGNOSIS — J3089 Other allergic rhinitis: Secondary | ICD-10-CM | POA: Diagnosis not present

## 2019-06-22 DIAGNOSIS — J3081 Allergic rhinitis due to animal (cat) (dog) hair and dander: Secondary | ICD-10-CM | POA: Diagnosis not present

## 2019-06-22 DIAGNOSIS — J301 Allergic rhinitis due to pollen: Secondary | ICD-10-CM | POA: Diagnosis not present

## 2019-06-30 DIAGNOSIS — J301 Allergic rhinitis due to pollen: Secondary | ICD-10-CM | POA: Diagnosis not present

## 2019-06-30 DIAGNOSIS — J3089 Other allergic rhinitis: Secondary | ICD-10-CM | POA: Diagnosis not present

## 2019-06-30 DIAGNOSIS — J3081 Allergic rhinitis due to animal (cat) (dog) hair and dander: Secondary | ICD-10-CM | POA: Diagnosis not present

## 2019-07-08 IMAGING — MR MR ABDOMEN WO/W CM
10 of 20 series · 20 of 48 positions shown · IV contrast (Yes)
Comparison: CT on 03/03/2018

CLINICAL DATA: Abdominal pain. Indeterminate liver lesion on recent
CT.

EXAM:
MRI ABDOMEN WITH CONTRAST
TECHNIQUE: Multiplanar multisequence MR imaging of the abdomen was performed
after the administration of intravenous contrast.

[Series 3: T2 · coronal · 5.0mm · 0.78mm/px · 1 of 34 slices shown (1 of 2)]
[im 1/34]
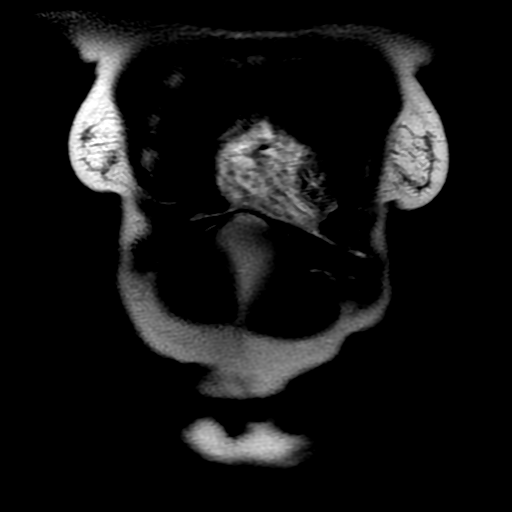

[Series 4: ax dualecho · axial · 5.0mm · 0.78mm/px · z∈[-116,+129]mm · 2 of 100 slices shown]
[im 1/100]
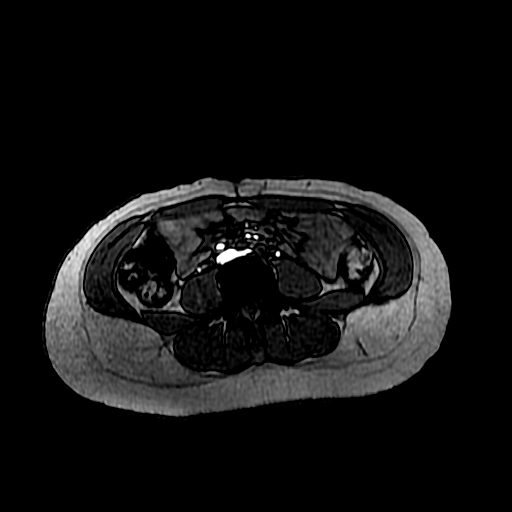
[im 100/100]
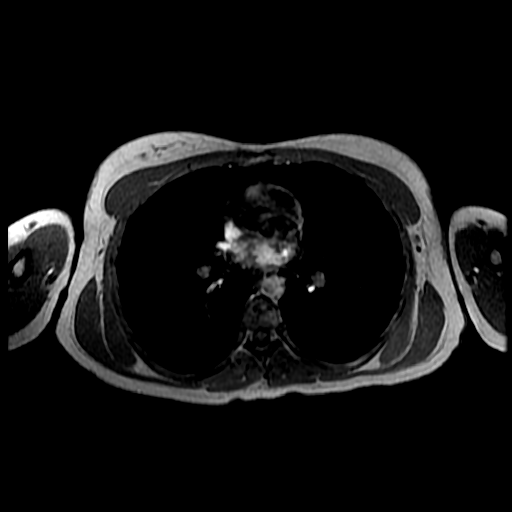

[Series 7: T2 fat-sat · axial · 5.0mm · 0.78mm/px · 1 of 55 slices shown]
[im 1/55]
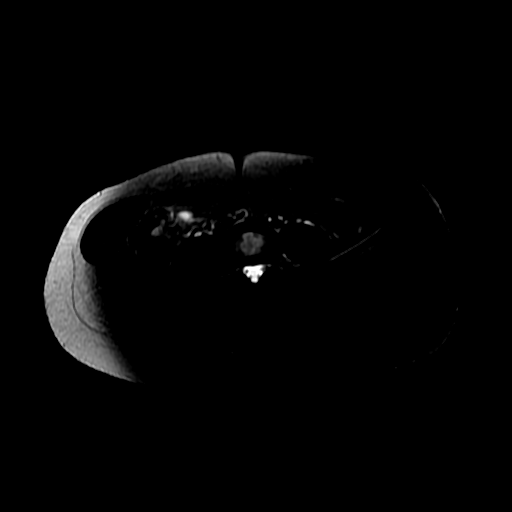

[Series 8: bSSFP fat-sat · axial · 5.0mm · 0.78mm/px · 1 of 55 slices shown]
[im 1/55]
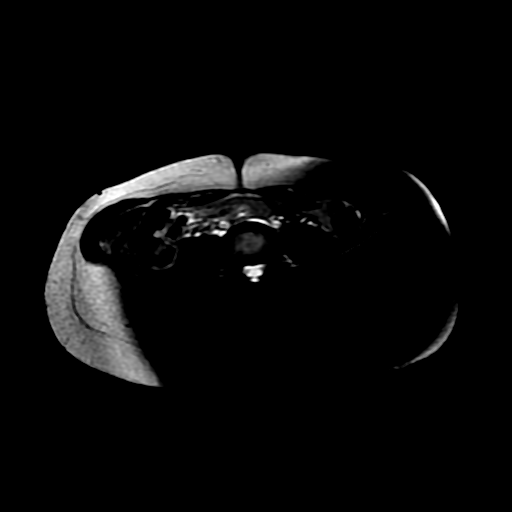

[Series 9: T2 · axial · 5.0mm · 0.78mm/px · 1 of 50 slices shown (2 of 2)]
[im 1/50]
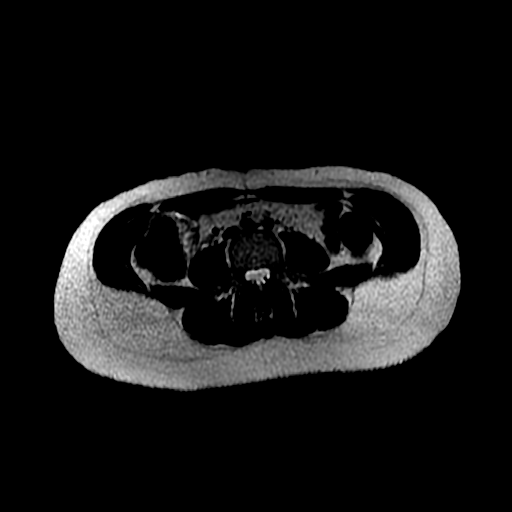

[Series 10: DWI b500 · axial · 5.0mm · 1.56mm/px · z∈[-116,+129]mm · 2 of 100 slices shown]
[im 1/100]
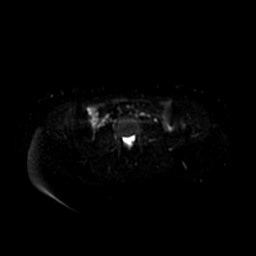
[im 100/100]
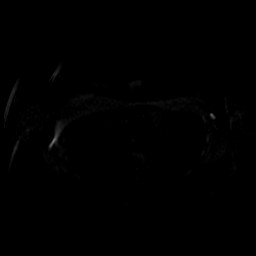

[Series 11: T1 dynamic · coronal · delayed · 4.0mm · 0.86mm/px · 3 of 88 slices shown (1 of 2)]
[im 1/88]
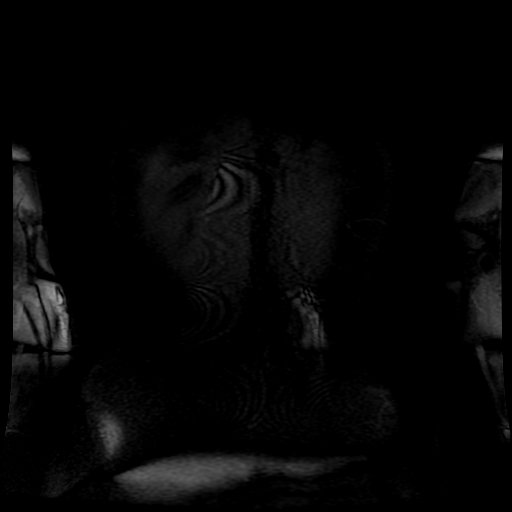
[im 44/88]
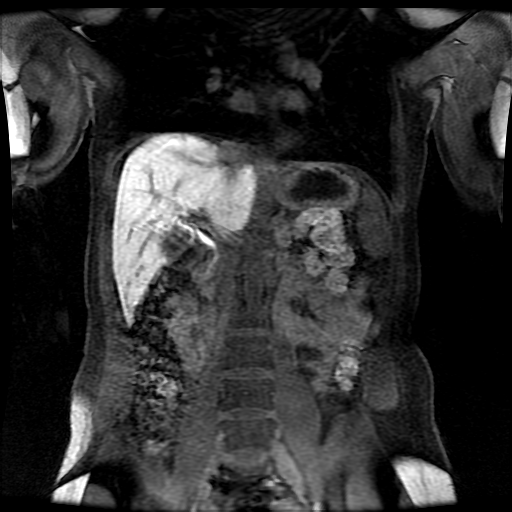
[im 88/88]
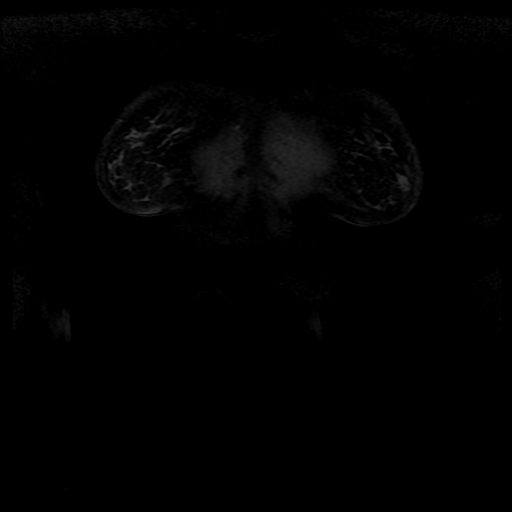

[Series 12: T1 dynamic · axial · delayed · 5.4mm · 0.78mm/px · z∈[-120,+125]mm · 3 of 92 slices shown (2 of 2)]
[im 1/92]
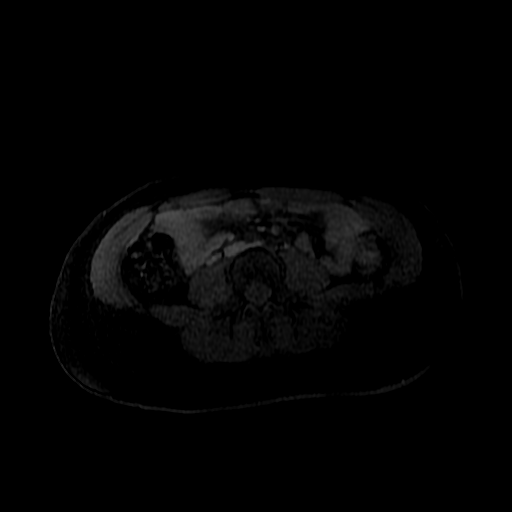
[im 46/92]
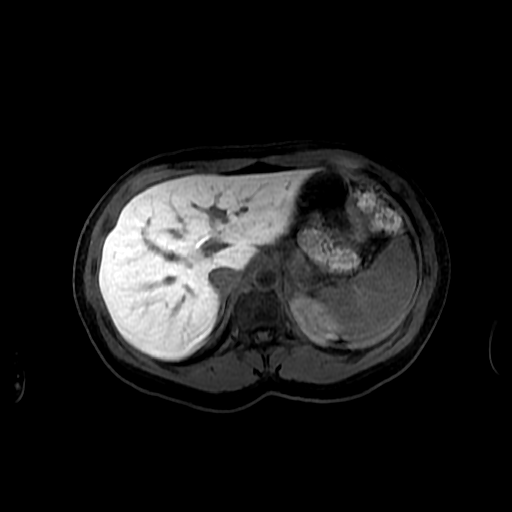
[im 92/92]
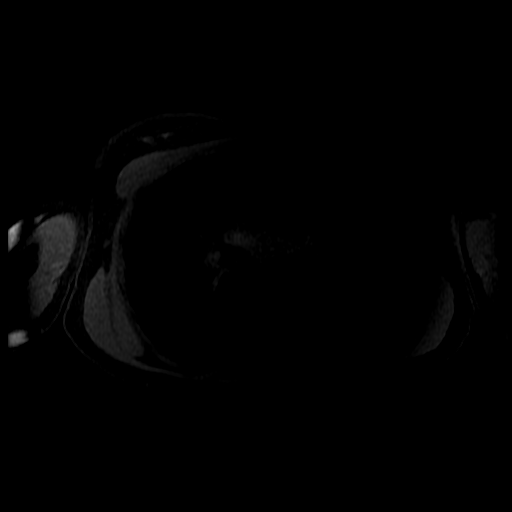

[Series 100: ((id)/(date)..92)-((id)/500/1..92) · axial · 5.4mm · 0.78mm/px · z∈[-120,+125]mm · 3 of 92 slices shown (1 of 2)]
[im 1/92]
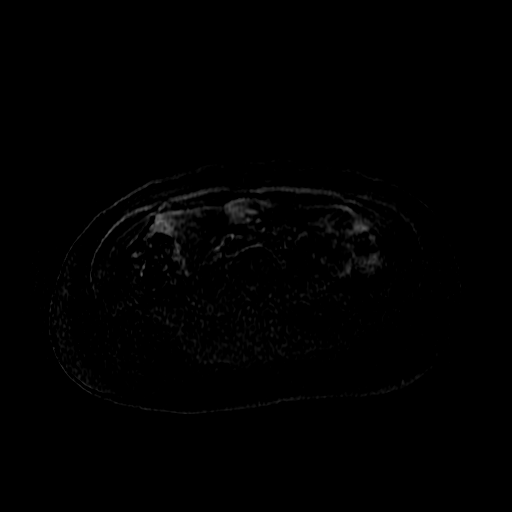
[im 46/92]
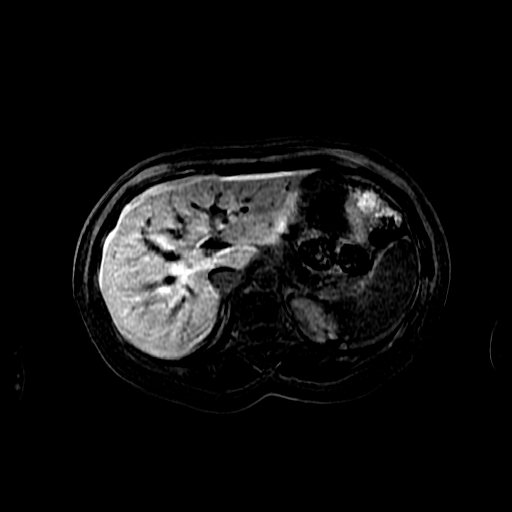
[im 92/92]
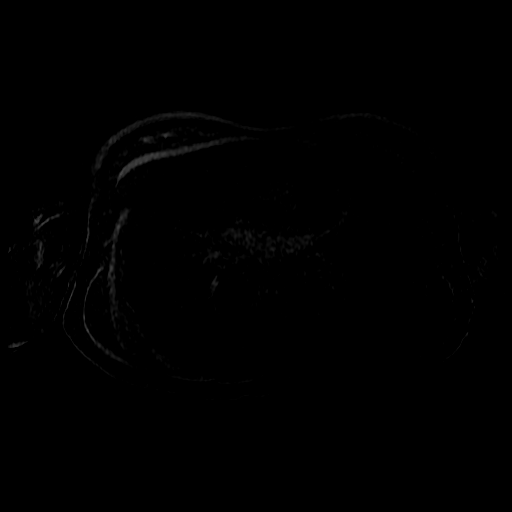

[Series 101: ((id)/(date)..92)-((id)/500/1..92) · axial · 5.4mm · 0.78mm/px · z∈[-120,+125]mm · 3 of 92 slices shown (2 of 2)]
[im 1/92]
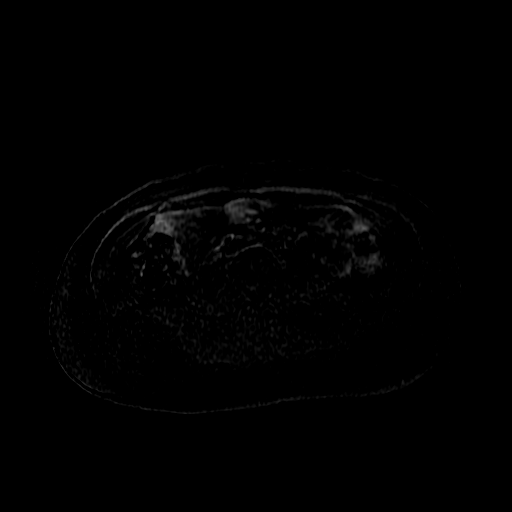
[im 46/92]
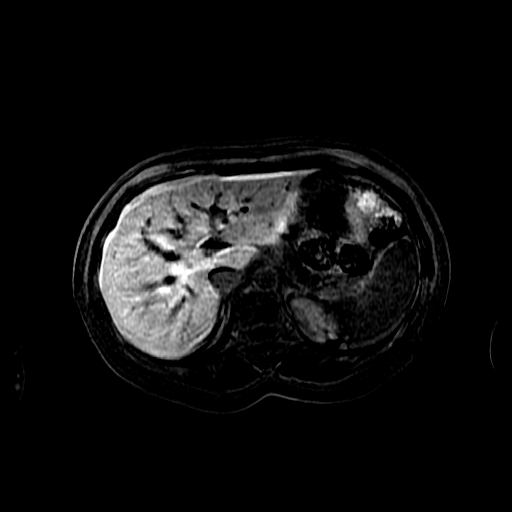
[im 92/92]
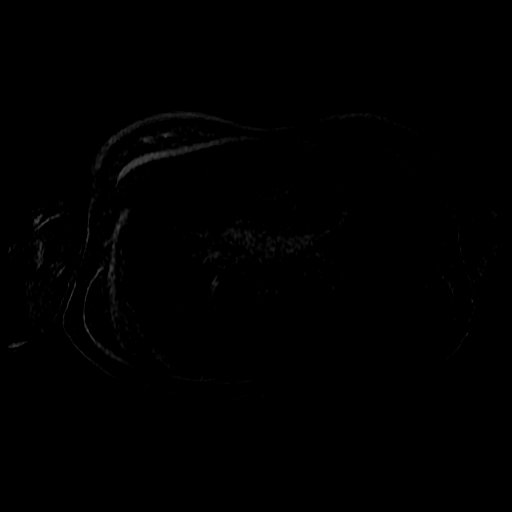

[20 of 48 positions shown; findings below may reference images not displayed]

FINDINGS: Lower chest: No acute findings.

Hepatobiliary: No hepatic masses identified. A wedge-shaped area of
arterial phase hyperenhancement is seen in the posterior right
hepatic lobe which shows near isointensity on portal venous phase.
This is most consistent with a perfusion anomaly. Gallbladder is
unremarkable. No evidence of biliary ductal dilatation.

Pancreas:  No mass or inflammatory changes.

Spleen:  Within normal limits in size and appearance.

Adrenals/Urinary Tract: No masses identified. No evidence of
hydronephrosis.

Stomach/Bowel: Visualized portion unremarkable.

Vascular/Lymphatic: No pathologically enlarged lymph nodes
identified. No abdominal aortic aneurysm.

Other:  None.

Musculoskeletal:  No suspicious bone lesions identified.
IMPRESSION: Benign perfusion anomaly in the posterior right hepatic lobe. No
evidence of neoplasm or other significant abnormality.

## 2019-07-11 DIAGNOSIS — J3081 Allergic rhinitis due to animal (cat) (dog) hair and dander: Secondary | ICD-10-CM | POA: Diagnosis not present

## 2019-07-11 DIAGNOSIS — J3089 Other allergic rhinitis: Secondary | ICD-10-CM | POA: Diagnosis not present

## 2019-07-11 DIAGNOSIS — J301 Allergic rhinitis due to pollen: Secondary | ICD-10-CM | POA: Diagnosis not present

## 2019-07-20 DIAGNOSIS — J3089 Other allergic rhinitis: Secondary | ICD-10-CM | POA: Diagnosis not present

## 2019-07-20 DIAGNOSIS — J3081 Allergic rhinitis due to animal (cat) (dog) hair and dander: Secondary | ICD-10-CM | POA: Diagnosis not present

## 2019-07-20 DIAGNOSIS — J301 Allergic rhinitis due to pollen: Secondary | ICD-10-CM | POA: Diagnosis not present

## 2019-07-29 DIAGNOSIS — J3089 Other allergic rhinitis: Secondary | ICD-10-CM | POA: Diagnosis not present

## 2019-07-29 DIAGNOSIS — J301 Allergic rhinitis due to pollen: Secondary | ICD-10-CM | POA: Diagnosis not present

## 2019-07-29 DIAGNOSIS — J3081 Allergic rhinitis due to animal (cat) (dog) hair and dander: Secondary | ICD-10-CM | POA: Diagnosis not present

## 2019-08-05 ENCOUNTER — Emergency Department (HOSPITAL_COMMUNITY)
Admission: EM | Admit: 2019-08-05 | Discharge: 2019-08-05 | Disposition: A | Discharge status: Home / Self Care | Payer: BC Managed Care – PPO | Attending: Emergency Medicine | Admitting: Emergency Medicine

## 2019-08-05 ENCOUNTER — Other Ambulatory Visit: Payer: Self-pay

## 2019-08-05 ENCOUNTER — Encounter (HOSPITAL_COMMUNITY): Payer: Self-pay | Admitting: Emergency Medicine

## 2019-08-05 DIAGNOSIS — L509 Urticaria, unspecified: Secondary | ICD-10-CM | POA: Diagnosis not present

## 2019-08-05 DIAGNOSIS — J3081 Allergic rhinitis due to animal (cat) (dog) hair and dander: Secondary | ICD-10-CM | POA: Diagnosis not present

## 2019-08-05 DIAGNOSIS — J301 Allergic rhinitis due to pollen: Secondary | ICD-10-CM | POA: Diagnosis not present

## 2019-08-05 DIAGNOSIS — J3089 Other allergic rhinitis: Secondary | ICD-10-CM | POA: Diagnosis not present

## 2019-08-05 LAB — PREGNANCY, URINE: Preg Test, Ur: NEGATIVE

## 2019-08-05 MED ORDER — PREDNISONE 20 MG PO TABS
60.0000 mg | ORAL_TABLET | Freq: Once | ORAL | Status: AC
  Administered 2019-08-05: 60 mg via ORAL
  Filled 2019-08-05: qty 3

## 2019-08-05 MED ORDER — PREDNISONE 20 MG PO TABS: 40.0000 mg | ORAL_TABLET | Freq: Every day | ORAL | 0 refills | Status: AC

## 2019-08-05 MED ORDER — FAMOTIDINE 20 MG PO TABS
20.0000 mg | ORAL_TABLET | Freq: Once | ORAL | Status: AC
  Administered 2019-08-05: 18:00:00 20 mg via ORAL
  Filled 2019-08-05: qty 1

## 2019-08-05 NOTE — ED Provider Notes (Signed)
Home EMERGENCY DEPARTMENT Provider Note   CSN: [id number] Arrival date & time: 08/05/19  1524     History Chief Complaint  Patient presents with  . Urticaria    Sonia [name] is a 34 y.o. female.  The history is provided by the patient.  Rash Location:  Full body Quality: itchiness and redness   Severity:  Mild Onset quality:  Gradual Duration:  2 hours Timing:  Constant Progression:  Unchanged Chronicity:  New Context comment:  Hives after allergy shot in clinic today, took benadryl. No SOB, no n/v, no breathing issues.  Relieved by:  Antihistamines Worsened by:  Nothing Associated symptoms: periorbital edema   Associated symptoms: no abdominal pain, no diarrhea, no fatigue, no fever, no headaches, no hoarse voice, no induration, no joint pain, no myalgias, no nausea, no shortness of breath, no sore throat, no throat swelling, no tongue swelling, no URI, not vomiting and not wheezing        Past Medical History:  Diagnosis Date  . Crohn's colitis ([place])   . GERD (gastroesophageal reflux disease)    no meds  . Headache(784.0)   . Normal labor 03/27/2013  . Postpartum care following cesarean delivery (11/9) 03/27/2013    Patient Active Problem List   Diagnosis Date Noted  . Insomnia 09/29/2018  . Bulimia nervosa, purging type 09/15/2018  . Moderate major depression ([place]) 09/15/2018  . Crohn's disease of colon without complication ([place]) 96/78/9381  . LUQ abdominal pain 03/01/2018  . Crohn's disease ([place]) 08/30/2014  . Headache disorder 08/30/2014  . Anxiety 06/18/2009  . GERD 05/23/2009    Past Surgical History:  Procedure Laterality Date  . CESAREAN SECTION N/A 03/27/2013   Procedure: CESAREAN SECTION;  Surgeon: Lovenia Kim, MD;  Location: [facility];  Service: Obstetrics;  Laterality: N/A;  . CESAREAN SECTION N/A 09/04/2014   Procedure: REPEAT CESAREAN SECTION;  Surgeon: Crawford Givens, MD;  Location: [facility];  Service:  Obstetrics;  Laterality: N/A;  . HAND SURGERY  2012   with nerve block  . LAPAROSCOPIC ENDOMETRIOSIS FULGURATION    . WISDOM TOOTH EXTRACTION       OB History    Gravida  2   Para  2   Term  2   Preterm  0   AB  0   Living  2     SAB  0   TAB  0   Ectopic  0   Multiple  0   Live Births  2           Family History  Problem Relation Age of Onset  . COPD Mother   . Heart disease Mother        smoker  . Hernia Mother   . Diverticulitis Mother   . Kidney disease Mother   . Heart failure Mother   . Stroke Mother 65  . Osteoporosis Mother   . Anxiety disorder Mother        ? on xanax  . Heart disease Maternal Grandmother        smoker  . Arthritis-Osteo Maternal Grandmother   . Cancer Maternal Grandfather        throat  . Alcohol abuse Maternal Grandfather   . Other Father        pituitary gland tumor  . Depression Sister   . Anxiety disorder Sister   . Colon cancer Neg Hx     Social History   Tobacco Use  . Smoking status:  Never Smoker  . Smokeless tobacco: Former Systems developer    Types: Chew  Substance Use Topics  . Alcohol use: No    Comment: occasionally  . Drug use: No    Home Medications Prior to Admission medications   Medication Sig Start Date End Date Taking? Authorizing Provider  budesonide-formoterol (SYMBICORT) 80-4.5 MCG/ACT inhaler Inhale 2 puffs into the lungs 2 (two) times a day.    [provider]  buPROPion (WELLBUTRIN SR) 150 MG 12 hr tablet Take 1 tablet (150 mg total) by mouth 2 (two) times daily. 03/18/19   Caren Macadam, MD  cetirizine (ZYRTEC) 10 MG tablet Take 10 mg by mouth daily.    [provider]  dicyclomine (BENTYL) 10 MG capsule Take 1 capsule (10 mg total) by mouth 3 (three) times daily before meals. 03/29/18   Zehr, Laban Emperor, PA-C  diphenhydrAMINE (BENADRYL) 25 mg capsule Take 25 mg by mouth at bedtime as needed for allergies. For itching    [provider]  escitalopram (LEXAPRO) 10  MG tablet Take 1 tablet (10 mg total) by mouth daily. 05/12/19   Koberlein, Steele Berg, MD  fluticasone (FLONASE) 50 MCG/ACT nasal spray Place 1 spray into both nostrils daily as needed for allergies or rhinitis.    [provider]  montelukast (SINGULAIR) 10 MG tablet Take 10 mg by mouth at bedtime.    [provider]  Norgestimate-Eth Estradiol (SPRINTEC 28 PO) Take by mouth.    [provider]  predniSONE (DELTASONE) 20 MG tablet Take 2 tablets (40 mg total) by mouth daily for 4 days. 08/05/19 08/09/19  Dmiya Malphrus, DO  traZODone (DESYREL) 50 MG tablet TAKE 1/2 TO 1 TABLET BY MOUTH (25-50MG) AT BEDTIME AS NEEDED FOR SLEEP 06/15/19   Koberlein, Andris Flurry C, MD  triamcinolone cream (KENALOG) 0.1 % Apply 1 application topically 2 (two) times daily. 12/30/18   Wieters, Hallie C, PA-C    Allergies    Other  Review of Systems   Review of Systems  Constitutional: Negative for chills, fatigue and fever.  HENT: Negative for ear pain, hoarse voice and sore throat.   Eyes: Positive for itching. Negative for pain and visual disturbance.  Respiratory: Negative for cough, shortness of breath and wheezing.   Cardiovascular: Negative for chest pain and palpitations.  Gastrointestinal: Negative for abdominal pain, diarrhea, nausea and vomiting.  Genitourinary: Negative for dysuria and hematuria.  Musculoskeletal: Negative for arthralgias, back pain and myalgias.  Skin: Positive for rash. Negative for color change.  Neurological: Negative for seizures, syncope and headaches.  All other systems reviewed and are negative.   Physical Exam Updated Vital Signs  ED Triage Vitals  Enc Vitals Group     BP 08/05/19 1527 119/85     Pulse Rate 08/05/19 1527 89     Resp 08/05/19 1527 16     Temp 08/05/19 1527 98.1 F (36.7 C)     Temp Source 08/05/19 1527 Oral     SpO2 08/05/19 1527 98 %     Weight 08/05/19 1540 134 lb (60.8 kg)     Height 08/05/19 1540 5' 2"  (1.575 m)     Head  Circumference --      Peak Flow --      Pain Score --      Pain Loc --      Pain Edu? --      Excl. in [place]? --     Physical Exam Vitals and nursing note reviewed.  Constitutional:  General: She is not in acute distress.    Appearance: She is well-developed. She is not ill-appearing.  HENT:     Head: Normocephalic and atraumatic.     Nose: Nose normal.     Mouth/Throat:     Mouth: Mucous membranes are moist.     Pharynx: No oropharyngeal exudate or posterior oropharyngeal erythema.     Comments: No swelling to lips or tongue or posterior oropharynx Eyes:     Extraocular Movements: Extraocular movements intact.     Conjunctiva/sclera: Conjunctivae normal.     Pupils: Pupils are equal, round, and reactive to light.     Comments: Swelling to eyelids  Cardiovascular:     Rate and Rhythm: Normal rate and regular rhythm.     Pulses: Normal pulses.     Heart sounds: Normal heart sounds. No murmur.  Pulmonary:     Effort: Pulmonary effort is normal. No respiratory distress.     Breath sounds: Normal breath sounds. No stridor. No wheezing, rhonchi or rales.  Abdominal:     Palpations: Abdomen is soft.     Tenderness: There is no abdominal tenderness.  Musculoskeletal:     Cervical back: Normal range of motion and neck supple.  Skin:    General: Skin is warm and dry.     Capillary Refill: Capillary refill takes less than 2 seconds.     Findings: Rash present.     Comments: Diffuse hives full body  Neurological:     General: No focal deficit present.     Mental Status: She is alert.  Psychiatric:        Mood and Affect: Mood normal.     ED Results / Procedures / Treatments   Labs (all labs ordered are listed, but only abnormal results are displayed) Labs Reviewed  PREGNANCY, URINE    EKG None  Radiology No results found.  Procedures Procedures (including critical care time)  Medications Ordered in ED Medications  famotidine (PEPCID) tablet 20 mg (20 mg Oral  Given 08/05/19 1737)  predniSONE (DELTASONE) tablet 60 mg (60 mg Oral Given 08/05/19 1737)    ED Course  I have reviewed the triage vital signs and the nursing notes.  Pertinent labs & imaging results that were available during my care of the patient were reviewed by me and considered in my medical decision making (see chart for details).    MDM Rules/Calculators/A&P                      Savhanna Wynetta Emery Cassata is a 34 year old female with history of Crohn's who presents to the ED with rash, hives.  Patient with normal vitals.  No fever.  Patient was at her allergy clinic getting her allergy shot and shortly thereafter developed a diffuse rash and hives.  No shortness of breath, wheezing, GI symptoms.  No signs of anaphylaxis on exam.  Mouth is clear.  No lip or tongue swelling.  No wheezing, basically patient with diffuse hives.  She has already taken Benadryl.  Pregnancy test is negative.  Patient was given Pepcid and prednisone.  She was observed for 5 hours post allergic reaction and rash improved.  She did not have any symptoms to suggest anaphylaxis.  She has epipens at home.  Given further education about allergic reactions.  She states that they have been trying to adjust when she gets her shots because she has been having some reactions like this with prior shots.  Recommend that they  continue to make these changes.  Discharged in good condition.  This chart was dictated using voice recognition software.  Despite best efforts to proofread,  errors can occur which can change the documentation meaning.    Final Clinical Impression(s) / ED Diagnoses Final diagnoses:  Urticaria    Rx / DC Orders ED Discharge Orders         Ordered    predniSONE (DELTASONE) 20 MG tablet  Daily     08/05/19 1903           Lennice Sites, DO 08/05/19 1906

## 2019-08-05 NOTE — ED Triage Notes (Addendum)
Pt has over 50 environmental allergies and at 2pm she developed full body hives after getting an allergy shot from the clinic, pt has itching into scalp and ears. Pt states she feels a little lightheaded, denies any CP and SOB.

## 2019-08-05 NOTE — Discharge Instructions (Addendum)
Continue Benadryl as discussed.

## 2019-08-05 NOTE — ED Notes (Signed)
Patient verbalizes understanding of discharge instructions. Opportunity for questioning and answers were provided. Armband removed by staff, pt discharged from ED, ambulated to car.

## 2019-08-08 DIAGNOSIS — J3089 Other allergic rhinitis: Secondary | ICD-10-CM | POA: Diagnosis not present

## 2019-08-08 DIAGNOSIS — J3081 Allergic rhinitis due to animal (cat) (dog) hair and dander: Secondary | ICD-10-CM | POA: Diagnosis not present

## 2019-08-08 DIAGNOSIS — H1045 Other chronic allergic conjunctivitis: Secondary | ICD-10-CM | POA: Diagnosis not present

## 2019-08-08 DIAGNOSIS — J301 Allergic rhinitis due to pollen: Secondary | ICD-10-CM | POA: Diagnosis not present

## 2019-08-22 DIAGNOSIS — J301 Allergic rhinitis due to pollen: Secondary | ICD-10-CM | POA: Diagnosis not present

## 2019-08-22 DIAGNOSIS — J3089 Other allergic rhinitis: Secondary | ICD-10-CM | POA: Diagnosis not present

## 2019-08-22 DIAGNOSIS — J3081 Allergic rhinitis due to animal (cat) (dog) hair and dander: Secondary | ICD-10-CM | POA: Diagnosis not present

## 2019-08-25 DIAGNOSIS — J3089 Other allergic rhinitis: Secondary | ICD-10-CM | POA: Diagnosis not present

## 2019-08-25 DIAGNOSIS — J3081 Allergic rhinitis due to animal (cat) (dog) hair and dander: Secondary | ICD-10-CM | POA: Diagnosis not present

## 2019-08-25 DIAGNOSIS — J301 Allergic rhinitis due to pollen: Secondary | ICD-10-CM | POA: Diagnosis not present

## 2019-08-26 ENCOUNTER — Other Ambulatory Visit: Payer: Self-pay

## 2019-08-26 ENCOUNTER — Ambulatory Visit (INDEPENDENT_AMBULATORY_CARE_PROVIDER_SITE_OTHER)
Admission: RE | Admit: 2019-08-26 | Discharge: 2019-08-26 | Disposition: A | Discharge status: Other Acute Inpatient Hospital | Payer: BC Managed Care – PPO | Source: Ambulatory Visit

## 2019-08-26 ENCOUNTER — Encounter (HOSPITAL_COMMUNITY): Payer: Self-pay

## 2019-08-26 ENCOUNTER — Ambulatory Visit (HOSPITAL_COMMUNITY)
Admission: EM | Admit: 2019-08-26 | Discharge: 2019-08-26 | Disposition: A | Discharge status: Home / Self Care | Payer: BC Managed Care – PPO | Attending: Urgent Care | Admitting: Urgent Care

## 2019-08-26 DIAGNOSIS — Z841 Family history of disorders of kidney and ureter: Secondary | ICD-10-CM | POA: Diagnosis not present

## 2019-08-26 DIAGNOSIS — Z79899 Other long term (current) drug therapy: Secondary | ICD-10-CM | POA: Insufficient documentation

## 2019-08-26 DIAGNOSIS — R519 Headache, unspecified: Secondary | ICD-10-CM

## 2019-08-26 DIAGNOSIS — R05 Cough: Secondary | ICD-10-CM

## 2019-08-26 DIAGNOSIS — R062 Wheezing: Secondary | ICD-10-CM

## 2019-08-26 DIAGNOSIS — R059 Cough, unspecified: Secondary | ICD-10-CM

## 2019-08-26 DIAGNOSIS — Z825 Family history of asthma and other chronic lower respiratory diseases: Secondary | ICD-10-CM | POA: Insufficient documentation

## 2019-08-26 DIAGNOSIS — Z8249 Family history of ischemic heart disease and other diseases of the circulatory system: Secondary | ICD-10-CM | POA: Insufficient documentation

## 2019-08-26 DIAGNOSIS — R0789 Other chest pain: Secondary | ICD-10-CM | POA: Diagnosis not present

## 2019-08-26 DIAGNOSIS — J4540 Moderate persistent asthma, uncomplicated: Secondary | ICD-10-CM

## 2019-08-26 DIAGNOSIS — Z20822 Contact with and (suspected) exposure to covid-19: Secondary | ICD-10-CM | POA: Insufficient documentation

## 2019-08-26 DIAGNOSIS — J3089 Other allergic rhinitis: Secondary | ICD-10-CM | POA: Insufficient documentation

## 2019-08-26 DIAGNOSIS — R0981 Nasal congestion: Secondary | ICD-10-CM

## 2019-08-26 DIAGNOSIS — Z7951 Long term (current) use of inhaled steroids: Secondary | ICD-10-CM | POA: Insufficient documentation

## 2019-08-26 MED ORDER — METHYLPREDNISOLONE ACETATE 80 MG/ML IJ SUSP
INTRAMUSCULAR | Status: AC
  Filled 2019-08-26: qty 1

## 2019-08-26 MED ORDER — HYDROXYZINE HCL 25 MG PO TABS: 12.5000 mg | ORAL_TABLET | Freq: Three times a day (TID) | ORAL | 0 refills | Status: DC | PRN

## 2019-08-26 MED ORDER — ALBUTEROL SULFATE HFA 108 (90 BASE) MCG/ACT IN AERS: 1.0000 | INHALATION_SPRAY | RESPIRATORY_TRACT | 0 refills | Status: AC | PRN

## 2019-08-26 MED ORDER — METHYLPREDNISOLONE ACETATE 80 MG/ML IJ SUSP
80.0000 mg | Freq: Once | INTRAMUSCULAR | Status: AC
  Administered 2019-08-26: 80 mg via INTRAMUSCULAR

## 2019-08-26 NOTE — ED Triage Notes (Signed)
Pt presents to UC for evaluation after a online visit today. Pt reports having nasal congestion, sneezing, sore throat x 2 weeks.

## 2019-08-26 NOTE — ED Provider Notes (Signed)
[facility]   MRN: [id number] DOB: 02-15-1986  Subjective:   Sonia [name] is a 34 y.o. female presenting for 2 week hx of persistent shob, wheezing, sinus headache, scratchy sore throat. Was doing allergy shots but had a reaction and had to present to the ER as a result. She called her allergist, was advised to come to an urgent care or ER. Took 4 day prednisone course. Denies chest pain, fever.  Has been using her inhaler about 5 times a day. She is using her Symbicort daily twice a day. I also using benadryl, Zyrtec, Flonase, Singulair. Has a very difficult time with allergies.   No current facility-administered medications for this encounter.  Current Outpatient Medications:  .  budesonide-formoterol (SYMBICORT) 80-4.5 MCG/ACT inhaler, Inhale 2 puffs into the lungs 2 (two) times a day., Disp: , Rfl:  .  buPROPion (WELLBUTRIN SR) 150 MG 12 hr tablet, Take 1 tablet (150 mg total) by mouth 2 (two) times daily., Disp: 180 tablet, Rfl: 1 .  cetirizine (ZYRTEC) 10 MG tablet, Take 10 mg by mouth daily., Disp: , Rfl:  .  dicyclomine (BENTYL) 10 MG capsule, Take 1 capsule (10 mg total) by mouth 3 (three) times daily before meals., Disp: 90 capsule, Rfl: 2 .  diphenhydrAMINE (BENADRYL) 25 mg capsule, Take 25 mg by mouth at bedtime as needed for allergies. For itching, Disp: , Rfl:  .  escitalopram (LEXAPRO) 10 MG tablet, Take 1 tablet (10 mg total) by mouth daily., Disp: 90 tablet, Rfl: 1 .  fluticasone (FLONASE) 50 MCG/ACT nasal spray, Place 1 spray into both nostrils daily as needed for allergies or rhinitis., Disp: , Rfl:  .  montelukast (SINGULAIR) 10 MG tablet, Take 10 mg by mouth at bedtime., Disp: , Rfl:  .  Norgestimate-Eth Estradiol (SPRINTEC 28 PO), Take by mouth., Disp: , Rfl:  .  traZODone (DESYREL) 50 MG tablet, TAKE 1/2 TO 1 TABLET BY MOUTH (25-50MG) AT BEDTIME AS NEEDED FOR SLEEP, Disp: 90 tablet, Rfl: 1 .  triamcinolone cream (KENALOG) 0.1 %, Apply 1  application topically 2 (two) times daily., Disp: 30 g, Rfl: 0   Allergies  Allergen Reactions  . Other Other (See Comments)    Mold, mildew, grass, fungus, trees cause swelling of eyes    Past Medical History:  Diagnosis Date  . Crohn's colitis ([place])   . GERD (gastroesophageal reflux disease)    no meds  . Headache(784.0)   . Normal labor 03/27/2013  . Postpartum care following cesarean delivery (11/9) 03/27/2013     Past Surgical History:  Procedure Laterality Date  . CESAREAN SECTION N/A 03/27/2013   Procedure: CESAREAN SECTION;  Surgeon: Lovenia Kim, MD;  Location: [facility];  Service: Obstetrics;  Laterality: N/A;  . CESAREAN SECTION N/A 09/04/2014   Procedure: REPEAT CESAREAN SECTION;  Surgeon: Crawford Givens, MD;  Location: [facility];  Service: Obstetrics;  Laterality: N/A;  . HAND SURGERY  2012   with nerve block  . LAPAROSCOPIC ENDOMETRIOSIS FULGURATION    . WISDOM TOOTH EXTRACTION      Family History  Problem Relation Age of Onset  . COPD Mother   . Heart disease Mother        smoker  . Hernia Mother   . Diverticulitis Mother   . Kidney disease Mother   . Heart failure Mother   . Stroke Mother 29  . Osteoporosis Mother   . Anxiety disorder Mother        ? on xanax  .  Heart disease Maternal Grandmother        smoker  . Arthritis-Osteo Maternal Grandmother   . Cancer Maternal Grandfather        throat  . Alcohol abuse Maternal Grandfather   . Other Father        pituitary gland tumor  . Depression Sister   . Anxiety disorder Sister   . Colon cancer Neg Hx     Social History   Tobacco Use  . Smoking status: Never Smoker  . Smokeless tobacco: Former Systems developer    Types: Chew  Substance Use Topics  . Alcohol use: No    Comment: occasionally  . Drug use: No    ROS   Objective:   Vitals: BP 111/78 (BP Location: Right Arm)   Pulse 61   Temp 98.5 F (36.9 C) (Oral)   Resp 16   LMP  (Within Days)   SpO2 100%   Physical Exam Constitutional:       General: She is not in acute distress.    Appearance: Normal appearance. She is well-developed. She is not ill-appearing, toxic-appearing or diaphoretic.  HENT:     Head: Normocephalic and atraumatic.     Nose: Nose normal.     Mouth/Throat:     Mouth: Mucous membranes are moist.  Eyes:     Extraocular Movements: Extraocular movements intact.     Pupils: Pupils are equal, round, and reactive to light.  Cardiovascular:     Rate and Rhythm: Normal rate and regular rhythm.     Pulses: Normal pulses.     Heart sounds: Normal heart sounds. No murmur. No friction rub. No gallop.   Pulmonary:     Effort: Pulmonary effort is normal. No respiratory distress.     Breath sounds: Normal breath sounds. No stridor. No wheezing, rhonchi or rales.  Skin:    General: Skin is warm and dry.     Findings: No rash.  Neurological:     Mental Status: She is alert and oriented to person, place, and time.  Psychiatric:        Mood and Affect: Mood normal.        Behavior: Behavior normal.        Thought Content: Thought content normal.        Judgment: Judgment normal.     Assessment and Plan :   1. Wheezing   2. Moderate persistent asthma without complication   3. Allergic rhinitis due to other allergic trigger, unspecified seasonality   4. Chest tightness     IM Depomedrol in clinic. Refilled her albuterol inhaler. COVID 19 testing pending. PE findings and vitals signs reassuring for discharge with close f/u with allergist clinic, pulmonologist. Counseled patient on potential for adverse effects with medications prescribed/recommended today, ER and return-to-clinic precautions discussed, patient verbalized understanding.    Jaynee Eagles, PA-C 08/26/19 1252

## 2019-08-26 NOTE — ED Provider Notes (Signed)
Virtual Visit via Video Note:  Sonia [name]  initiated request for Telemedicine visit with [facility] team. I connected with Sonia [name]  on 08/26/2019 at 10:14 AM  for a synchronized telemedicine visit using a video enabled HIPPA compliant telemedicine application. I verified that I am speaking with Sonia [name]  using two identifiers. Sonia Eagles, PA-C  was physically located in a [facility] Urgent care site and [place] was located at a different location.   The limitations of evaluation and management by telemedicine as well as the availability of in-person appointments were discussed. Patient was informed that she  may incur a bill ( including co-pay) for this virtual visit encounter. Sonia [name]  expressed understanding and gave verbal consent to proceed with virtual visit.     History of Present Illness:Sonia [name]  is a 34 y.o. female presents with 2 week hx of persistent shob, wheezing, sinus headache, scratchy sore throat. Was doing allergy shots but had a reaction and had to present to the ER as a result. She called her allergist, was advised to come to an urgent care or ER. Took 4 day prednisone course. Denies chest pain, fever.  Has been using her inhaler about 5 times a day.  ROS  No current facility-administered medications for this encounter.   Current Outpatient Medications  Medication Sig Dispense Refill  . budesonide-formoterol (SYMBICORT) 80-4.5 MCG/ACT inhaler Inhale 2 puffs into the lungs 2 (two) times a day.    Marland Kitchen buPROPion (WELLBUTRIN SR) 150 MG 12 hr tablet Take 1 tablet (150 mg total) by mouth 2 (two) times daily. 180 tablet 1  . cetirizine (ZYRTEC) 10 MG tablet Take 10 mg by mouth daily.    Marland Kitchen dicyclomine (BENTYL) 10 MG capsule Take 1 capsule (10 mg total) by mouth 3 (three) times daily before meals. 90 capsule 2  . diphenhydrAMINE (BENADRYL) 25 mg  capsule Take 25 mg by mouth at bedtime as needed for allergies. For itching    . escitalopram (LEXAPRO) 10 MG tablet Take 1 tablet (10 mg total) by mouth daily. 90 tablet 1  . fluticasone (FLONASE) 50 MCG/ACT nasal spray Place 1 spray into both nostrils daily as needed for allergies or rhinitis.    Marland Kitchen montelukast (SINGULAIR) 10 MG tablet Take 10 mg by mouth at bedtime.    Donnetta Hail Estradiol (SPRINTEC 28 PO) Take by mouth.    . traZODone (DESYREL) 50 MG tablet TAKE 1/2 TO 1 TABLET BY MOUTH (25-50MG ) AT BEDTIME AS NEEDED FOR SLEEP 90 tablet 1  . triamcinolone cream (KENALOG) 0.1 % Apply 1 application topically 2 (two) times daily. 30 g 0     Allergies  Allergen Reactions  . Other Other (See Comments)    Mold, mildew, grass, fungus, trees cause swelling of eyes     Past Medical History:  Diagnosis Date  . Crohn's colitis ([place])   . GERD (gastroesophageal reflux disease)    no meds  . Headache(784.0)   . Normal labor 03/27/2013  . Postpartum care following cesarean delivery (11/9) 03/27/2013    Past Surgical History:  Procedure Laterality Date  . CESAREAN SECTION N/A 03/27/2013   Procedure: CESAREAN SECTION;  Surgeon: Lovenia Kim, MD;  Location: [facility];  Service: Obstetrics;  Laterality: N/A;  . CESAREAN SECTION N/A 09/04/2014   Procedure: REPEAT CESAREAN SECTION;  Surgeon: Crawford Givens, MD;  Location: [facility];  Service: Obstetrics;  Laterality: N/A;  .  HAND SURGERY  2012   with nerve block  . LAPAROSCOPIC ENDOMETRIOSIS FULGURATION    . WISDOM TOOTH EXTRACTION        Observations/Objective: Physical Exam Constitutional:      General: She is not in acute distress.    Appearance: Normal appearance. She is well-developed. She is not ill-appearing, toxic-appearing or diaphoretic.  Eyes:     Extraocular Movements: Extraocular movements intact.  Pulmonary:     Effort: Pulmonary effort is normal.  Neurological:     General: No focal deficit present.     Mental Status:  She is alert and oriented to person, place, and time.  Psychiatric:        Mood and Affect: Mood normal.        Behavior: Behavior normal.        Thought Content: Thought content normal.        Judgment: Judgment normal.      Assessment and Plan:  1. Wheezing   2. Chest tightness   3. Cough   4. Sinus headache   5. Sinus congestion   6. Moderate persistent asthma without complication   7. Allergic rhinitis due to other allergic trigger, unspecified seasonality    Recommended an in person office visit for lung exam, vital signs.  Counseled patient that she may benefit from a Depo-Medrol injection as opposed to oral steroids.  Also would like to have COVID-19 testing done for the patient.  She is very agreeable to this, will come into our clinic today.   Follow Up Instructions:    I discussed the assessment and treatment plan with the patient. The patient was provided an opportunity to ask questions and all were answered. The patient agreed with the plan and demonstrated an understanding of the instructions.   The patient was advised to call back or seek an in-person evaluation if the symptoms worsen or if the condition fails to improve as anticipated.  I provided 10 minutes of non-face-to-face time during this encounter.    Wallis Bamberg, PA-C  08/26/2019 10:14 AM         Wallis Bamberg, PA-C 08/26/19 1024

## 2019-08-27 LAB — SARS CORONAVIRUS 2 (TAT 6-24 HRS): SARS Coronavirus 2: NEGATIVE

## 2019-08-28 ENCOUNTER — Encounter: Payer: Self-pay | Admitting: Family Medicine

## 2019-08-30 ENCOUNTER — Encounter: Payer: Self-pay | Admitting: Family Medicine

## 2019-09-01 DIAGNOSIS — J3081 Allergic rhinitis due to animal (cat) (dog) hair and dander: Secondary | ICD-10-CM | POA: Diagnosis not present

## 2019-09-01 DIAGNOSIS — J3089 Other allergic rhinitis: Secondary | ICD-10-CM | POA: Diagnosis not present

## 2019-09-01 DIAGNOSIS — J301 Allergic rhinitis due to pollen: Secondary | ICD-10-CM | POA: Diagnosis not present

## 2019-09-03 DIAGNOSIS — Z20828 Contact with and (suspected) exposure to other viral communicable diseases: Secondary | ICD-10-CM | POA: Diagnosis not present

## 2019-09-03 DIAGNOSIS — Z03818 Encounter for observation for suspected exposure to other biological agents ruled out: Secondary | ICD-10-CM | POA: Diagnosis not present

## 2019-09-06 ENCOUNTER — Encounter: Payer: Self-pay | Admitting: Family Medicine

## 2019-09-06 ENCOUNTER — Encounter: Payer: Self-pay | Admitting: Physician Assistant

## 2019-09-06 ENCOUNTER — Telehealth: Payer: Self-pay | Admitting: Family Medicine

## 2019-09-06 DIAGNOSIS — J4540 Moderate persistent asthma, uncomplicated: Secondary | ICD-10-CM | POA: Insufficient documentation

## 2019-09-06 NOTE — Telephone Encounter (Signed)
I don't know details of symptoms/timeline of illness, but I would suggest with history of wheezing (seen earlier this month and written dx of asthma, on symbicort) that she should call [phone] to see if she might qualify for COVID antibody infusion treatment? They have a 24/7 hotline and will talk through everything with her there. If she doesn't qualify then please set up either a virtual visit or help get her in with resp clinic for evaluation if needed/desired. I do think that it is worth having pulse ox to monitor oxygen levels at home during course of illness if she has access to one.

## 2019-09-06 NOTE — Telephone Encounter (Signed)
Spoke with the pt and informed her of the message below.  Patient declined calling the number below as she stated she does not feel she needs anything as her asthma only gets worse in the spring time.  I advised the pt she should call the number below now, advised not to wait until symptoms get worse to see if they can provide help to her and if not to call back for a virtual visit or to a visit with the respiratory clinic.  Patient agreed to call the number below and stated she will get a pulse oximeter.

## 2019-09-06 NOTE — Telephone Encounter (Signed)
Pt tested positive for COVID and would like to know if she can be prescribed medication or what she needs to do? Thanks

## 2019-09-07 ENCOUNTER — Encounter: Payer: Self-pay | Admitting: Family Medicine

## 2019-09-07 NOTE — Telephone Encounter (Signed)
Spoke with the pt and she stated she is feeling OK.  States she feels like "her skin hurts and she doesn't want anything to touch her skin".  Patient complains of a cough with yellow sputum, oxygen 98-99%, denies shortness of breath and states her "left lung hurts" x2 weeks.  Message sent to PCP.

## 2019-09-07 NOTE — Telephone Encounter (Signed)
Please check in and see how she is feeling today?

## 2019-09-14 ENCOUNTER — Other Ambulatory Visit: Payer: Self-pay | Admitting: Family Medicine

## 2019-09-14 ENCOUNTER — Ambulatory Visit (INDEPENDENT_AMBULATORY_CARE_PROVIDER_SITE_OTHER): Payer: BC Managed Care – PPO

## 2019-09-14 ENCOUNTER — Other Ambulatory Visit: Payer: Self-pay

## 2019-09-14 ENCOUNTER — Ambulatory Visit (HOSPITAL_COMMUNITY)
Admission: EM | Admit: 2019-09-14 | Discharge: 2019-09-14 | Disposition: A | Discharge status: Home / Self Care | Payer: BC Managed Care – PPO | Attending: Family Medicine | Admitting: Family Medicine

## 2019-09-14 ENCOUNTER — Encounter (HOSPITAL_COMMUNITY): Payer: Self-pay

## 2019-09-14 DIAGNOSIS — J4520 Mild intermittent asthma, uncomplicated: Secondary | ICD-10-CM

## 2019-09-14 DIAGNOSIS — R05 Cough: Secondary | ICD-10-CM | POA: Diagnosis not present

## 2019-09-14 DIAGNOSIS — F411 Generalized anxiety disorder: Secondary | ICD-10-CM

## 2019-09-14 DIAGNOSIS — R059 Cough, unspecified: Secondary | ICD-10-CM

## 2019-09-14 DIAGNOSIS — R0602 Shortness of breath: Secondary | ICD-10-CM | POA: Diagnosis not present

## 2019-09-14 DIAGNOSIS — R079 Chest pain, unspecified: Secondary | ICD-10-CM

## 2019-09-14 HISTORY — DX: Depression, unspecified: F32.A

## 2019-09-14 HISTORY — DX: COVID-19: U07.1

## 2019-09-14 MED ORDER — LORAZEPAM 1 MG PO TABS: 1.0000 mg | ORAL_TABLET | Freq: Three times a day (TID) | ORAL | 0 refills | Status: DC | PRN

## 2019-09-14 MED ORDER — PREDNISONE 10 MG (21) PO TBPK: ORAL_TABLET | Freq: Every day | ORAL | 0 refills | Status: DC

## 2019-09-14 NOTE — ED Triage Notes (Signed)
Pt COVID + as of last Tuesday. Pt c/o 3/10 burning mid sternal chest pain and pain under left breast. Pt states arms are tingling a little, but not as bad as last night and pt states she was tachycardic last night. Pt c/o SOB. PT has non labored breathing.

## 2019-09-14 NOTE — Discharge Instructions (Addendum)
You have been seen at the [facility] today for chest pain and shortness of breath. Your evaluation today was not suggestive of any emergent condition requiring medical intervention at this time. Your chest x-ray and ECG (heart tracing) did not show any worrisome changes. However, some medical problems make take more time to appear. Therefore, it's very important that you pay attention to any new symptoms or worsening of your current condition.  Please proceed directly to the Emergency Department immediately should you feel worse in any way or have any of the following symptoms: increasing or different chest pain, pain that spreads to your arm, neck, jaw, back or abdomen, worsening shortness of breath, or nausea and vomiting.

## 2019-09-15 NOTE — ED Provider Notes (Signed)
[facility] [place]   [id number] 09/14/19 Arrival Time: 5329  ASSESSMENT & PLAN:  1. Cough   2. Chest pain, unspecified type   3. SOB (shortness of breath)   4. Anxiety, generalized   5. Mild intermittent asthma, unspecified whether complicated      Patient history and exam consistent with non-cardiac cause of chest pain. Reassured.  ECG: Performed today and interpreted by me: NSR. No STEMI.  I have personally viewed the imaging studies ordered this visit. Normal CXR.   Begin: Meds ordered this encounter  Medications  . predniSONE (STERAPRED UNI-PAK 21 TAB) 10 MG (21) TBPK tablet    Sig: Take by mouth daily. Take as directed.    Dispense:  21 tablet    Refill:  0  . LORazepam (ATIVAN) 1 MG tablet    Sig: Take 1 tablet (1 mg total) by mouth every 8 (eight) hours as needed for anxiety.    Dispense:  15 tablet    Refill:  0    Chest pain precautions given. She plans to f/u with PCP to further discuss anxiety. No change in current medications.  Reviewed expectations re: course of current medical issues. Questions answered. Outlined signs and symptoms indicating need for more acute intervention. Patient verbalized understanding. After Visit Summary given.   SUBJECTIVE:  History from: patient. Sonia [name] is a 34 y.o. female who presents with complaint of sporadic L-sided anterior chest pain described as sharp that does not radiate. Onset abrupt, first noted 1-2 d ago. Reports these symptoms are overall improving since beginning. When present, rates as 3/10 in intensity when present; without associated n/v. Does report feeling SOB at times but feels this is secondary to wheezing. H/O asthma. Inhaler with some relief but temporary. Injury or recent strenuous activity: none. Typical duration of symptoms when present: few seconds. Denies: irregular heart beat, lower extremity edema, near-syncope, orthopnea, palpitations, paroxysmal nocturnal dyspnea and  syncope. Aggravating factors: have not been identified. Alleviating factors: have not been identified. Recent illnesses: none. Fever: absent. Ambulatory without assistance. Self/OTC treatment: none reported.  She agrees this all may be secondary to anxiety. More recently. Taking medications as directed. No SI/HI. "The chest pain just made me really nervous". No illicit drug use. Anxiety affecting sleep.  Social History   Tobacco Use  Smoking Status Never Smoker  Smokeless Tobacco Former Systems developer  . Types: Chew   Social History   Substance and Sexual Activity  Alcohol Use Yes   Comment: occasionally     OBJECTIVE:  Vitals:   09/14/19 1504 09/14/19 1505  BP: (!) 96/54   Pulse: 67   Resp: 18   Temp: 98.8 F (37.1 C)   TempSrc: Oral   SpO2: 100%   Weight:  63.5 kg  Height:  5' 2"  (1.575 m)    General appearance: alert, oriented, no acute distress but does appear anxious Eyes: PERRLA; EOMI; conjunctivae normal HENT: normocephalic; atraumatic Neck: supple with FROM Lungs: without labored respirations; speaks full sentences without difficulty; CTAB Heart: regular rate and rhythm without murmer Chest Wall: without tenderness to palpation Abdomen: soft, non-tender; no guarding or rebound tenderness Extremities: without edema; without calf swelling or tenderness; symmetrical without gross deformities Skin: warm and dry; without rash or lesions Neuro: normal gait Psychological: alert and cooperative; normal mood and affect  Labs Reviewed: Results for orders placed or performed during the hospital encounter of 08/26/19  SARS CORONAVIRUS 2 (TAT 6-24 HRS) Nasopharyngeal Nasopharyngeal Swab   Specimen: Nasopharyngeal Swab  Result Value Ref Range   SARS Coronavirus 2 NEGATIVE NEGATIVE    Imaging: DG Chest 2 View  Result Date: 09/14/2019 CLINICAL DATA:  34 year old female with acute cough, chest pain and shortness of breath. EXAM: CHEST - 2 VIEW COMPARISON:  None. FINDINGS:  The cardiomediastinal silhouette is unremarkable. There is no evidence of focal airspace disease, pulmonary edema, suspicious pulmonary nodule/mass, pleural effusion, or pneumothorax. No acute bony abnormalities are identified. IMPRESSION: No active cardiopulmonary disease. Electronically Signed   By: Margarette Canada M.D.   On: 09/14/2019 16:03     Allergies  Allergen Reactions  . Other Other (See Comments)    Mold, mildew, grass, fungus, trees cause swelling of eyes    Past Medical History:  Diagnosis Date  . COVID-19   . Crohn's colitis ([place])   . Depression   . GERD (gastroesophageal reflux disease)    no meds  . Normal labor 03/27/2013  . Postpartum care following cesarean delivery (11/9) 03/27/2013   Social History   Socioeconomic History  . Marital status: Married    Spouse name: Not on file  . Number of children: Not on file  . Years of education: Not on file  . Highest education level: Not on file  Occupational History  . Not on file  Tobacco Use  . Smoking status: Never Smoker  . Smokeless tobacco: Former Systems developer    Types: Chew  Substance and Sexual Activity  . Alcohol use: Yes    Comment: occasionally  . Drug use: No  . Sexual activity: Yes    Birth control/protection: None  Other Topics Concern  . Not on file  Social History Narrative  . Not on file   Social Determinants of Health   Financial Resource Strain:   . Difficulty of Paying Living Expenses:   Food Insecurity:   . Worried About Charity fundraiser in the Last Year:   . Arboriculturist in the Last Year:   Transportation Needs:   . Film/video editor (Medical):   Marland Kitchen Lack of Transportation (Non-Medical):   Physical Activity:   . Days of Exercise per Week:   . Minutes of Exercise per Session:   Stress:   . Feeling of Stress :   Social Connections:   . Frequency of Communication with Friends and Family:   . Frequency of Social Gatherings with Friends and Family:   . Attends Religious Services:     . Active Member of Clubs or Organizations:   . Attends Archivist Meetings:   Marland Kitchen Marital Status:   Intimate Partner Violence:   . Fear of Current or Ex-Partner:   . Emotionally Abused:   Marland Kitchen Physically Abused:   . Sexually Abused:    Family History  Problem Relation Age of Onset  . COPD Mother   . Heart disease Mother        smoker  . Hernia Mother   . Diverticulitis Mother   . Kidney disease Mother   . Heart failure Mother   . Stroke Mother 66  . Osteoporosis Mother   . Anxiety disorder Mother        ? on xanax  . Heart disease Maternal Grandmother        smoker  . Arthritis-Osteo Maternal Grandmother   . Cancer Maternal Grandfather        throat  . Alcohol abuse Maternal Grandfather   . Other Father        pituitary gland tumor  .  Depression Sister   . Anxiety disorder Sister   . Colon cancer Neg Hx    Past Surgical History:  Procedure Laterality Date  . CESAREAN SECTION N/A 03/27/2013   Procedure: CESAREAN SECTION;  Surgeon: Lovenia Kim, MD;  Location: [facility];  Service: Obstetrics;  Laterality: N/A;  . CESAREAN SECTION N/A 09/04/2014   Procedure: REPEAT CESAREAN SECTION;  Surgeon: Crawford Givens, MD;  Location: [facility];  Service: Obstetrics;  Laterality: N/A;  . HAND SURGERY  2012   with nerve block  . LAPAROSCOPIC ENDOMETRIOSIS FULGURATION    . WISDOM TOOTH EXTRACTION       Vanessa Kick, MD 09/15/19 [phone]

## 2019-09-20 ENCOUNTER — Telehealth (INDEPENDENT_AMBULATORY_CARE_PROVIDER_SITE_OTHER): Payer: BC Managed Care – PPO | Admitting: Family Medicine

## 2019-09-20 ENCOUNTER — Encounter: Payer: Self-pay | Admitting: Family Medicine

## 2019-09-20 VITALS — Wt 140.0 lb

## 2019-09-20 DIAGNOSIS — R05 Cough: Secondary | ICD-10-CM | POA: Diagnosis not present

## 2019-09-20 DIAGNOSIS — T7840XA Allergy, unspecified, initial encounter: Secondary | ICD-10-CM | POA: Diagnosis not present

## 2019-09-20 DIAGNOSIS — J45909 Unspecified asthma, uncomplicated: Secondary | ICD-10-CM | POA: Diagnosis not present

## 2019-09-20 DIAGNOSIS — R059 Cough, unspecified: Secondary | ICD-10-CM

## 2019-09-20 DIAGNOSIS — B342 Coronavirus infection, unspecified: Secondary | ICD-10-CM | POA: Diagnosis not present

## 2019-09-20 DIAGNOSIS — F339 Major depressive disorder, recurrent, unspecified: Secondary | ICD-10-CM

## 2019-09-20 MED ORDER — BENZONATATE 100 MG PO CAPS: 100.0000 mg | ORAL_CAPSULE | Freq: Two times a day (BID) | ORAL | 0 refills | Status: DC | PRN

## 2019-09-20 MED ORDER — DOXYCYCLINE HYCLATE 100 MG PO TABS: 100.0000 mg | ORAL_TABLET | Freq: Two times a day (BID) | ORAL | 0 refills | Status: DC

## 2019-09-20 NOTE — Patient Instructions (Signed)
-  I sent the medication(s) we discussed to your pharmacy: Meds ordered this encounter  Medications  . doxycycline (VIBRA-TABS) 100 MG tablet    Sig: Take 1 tablet (100 mg total) by mouth 2 (two) times daily.    Dispense:  14 tablet    Refill:  0  . benzonatate (TESSALON) 100 MG capsule    Sig: Take 1 capsule (100 mg total) by mouth 2 (two) times daily as needed for cough.    Dispense:  20 capsule    Refill:  0   Please continue your allergy and asthma regimen and call you asthma allergy specialist for further recommendations. Albuterol as needed.  Please contact [facility] today to schedule an appointment to assist with the depressed mood. [phone].  Please call your gynecologist about the menstrual bleeding.  Please let us know if you have any questions or concerns regarding this prescription.  I hope you are feeling better soon! Seek care promptly if your symptoms worsen, new concerns arise or you are not improving with treatment.  Follow up with me or Dr. Ethlyn Gallery in 1 week.

## 2019-09-20 NOTE — Progress Notes (Signed)
Virtual Visit via Video Note  I connected with Sonia [name]  on 09/20/19 at 10:30 AM EDT by a video enabled telemedicine application and verified that I am speaking with the correct person using two identifiers.  Location patient: home, [place] Location provider:work or home office Persons participating in the virtual visit: patient, provider  I discussed the limitations of evaluation and management by telemedicine and the availability of in person appointments. The patient expressed understanding and agreed to proceed.   HPI:  Acute visit for COVID19: -Had cough, L chest burning and was diagnosed with COVID19 on the 17th of April -she has had a continuous cough, some coughing fits - barking cough at times for weeks -she reports has had this in prior years this time of year with her her asthma -productive cough, thick greenish yellow sputum -she feels much more tired than usual -denies fevers, chills,  -she at baseline takes symbicort for her asthma and albuterol on high pollen days a few days per month -now needs her albuterol daily for the cough and mild SOB -feels can breath ok, but can't get a deep breath -O2 is > 97% -daily allergy/asthma meds: xyzal, symbicort, singulair, albuterol, hydroxyzine (for allergy reaction recently) -3/19 had steroid inj for allergic reaction to an allergy shot -had prednisone course starting 4/29 from [facility] - tapering now -she has a history of depression, she does feel likes is having some of this lately, denies SI, thoughts of harm, hallucinations. Admits to cog fog. On antidepressants, but not seeing a Social worker.  ROS: See pertinent positives and negatives per HPI.  Past Medical History:  Diagnosis Date  . COVID-19   . Crohn's colitis ([place])   . Depression   . GERD (gastroesophageal reflux disease)    no meds  . Normal labor 03/27/2013  . Postpartum care following cesarean delivery (11/9) 03/27/2013    Past Surgical History:  Procedure Laterality Date  .  CESAREAN SECTION N/A 03/27/2013   Procedure: CESAREAN SECTION;  Surgeon: Lovenia , MD;  Location: [facility];  Service: Obstetrics;  Laterality: N/A;  . CESAREAN SECTION N/A 09/04/2014   Procedure: REPEAT CESAREAN SECTION;  Surgeon: Crawford Givens, MD;  Location: [facility];  Service: Obstetrics;  Laterality: N/A;  . HAND SURGERY  2012   with nerve block  . LAPAROSCOPIC ENDOMETRIOSIS FULGURATION    . WISDOM TOOTH EXTRACTION      Family History  Problem Relation Age of Onset  . COPD Mother   . Heart disease Mother        smoker  . Hernia Mother   . Diverticulitis Mother   . Kidney disease Mother   . Heart failure Mother   . Stroke Mother 25  . Osteoporosis Mother   . Anxiety disorder Mother        ? on xanax  . Heart disease Maternal Grandmother        smoker  . Arthritis-Osteo Maternal Grandmother   . Cancer Maternal Grandfather        throat  . Alcohol abuse Maternal Grandfather   . Other Father        pituitary gland tumor  . Depression Sister   . Anxiety disorder Sister   . Colon cancer Neg Hx     SOCIAL HX: see hpi   Current Outpatient Medications:  .  albuterol (VENTOLIN HFA) 108 (90 Base) MCG/ACT inhaler, Inhale 1-2 puffs into the lungs every 4 (four) hours as needed for wheezing or shortness of breath., Disp: 18 g, Rfl:  0 .  budesonide-formoterol (SYMBICORT) 80-4.5 MCG/ACT inhaler, Inhale 2 puffs into the lungs 2 (two) times a day., Disp: , Rfl:  .  buPROPion (WELLBUTRIN SR) 150 MG 12 hr tablet, TAKE 1 TABLET BY MOUTH TWICE A DAY, Disp: 180 tablet, Rfl: 0 .  cetirizine (ZYRTEC) 10 MG tablet, Take 10 mg by mouth daily., Disp: , Rfl:  .  diphenhydrAMINE (BENADRYL) 25 mg capsule, Take 25 mg by mouth at bedtime as needed for allergies. For itching, Disp: , Rfl:  .  escitalopram (LEXAPRO) 10 MG tablet, Take 1 tablet (10 mg total) by mouth daily., Disp: 90 tablet, Rfl: 1 .  fluticasone (FLONASE) 50 MCG/ACT nasal spray, Place 1 spray into both nostrils daily as needed for  allergies or rhinitis., Disp: , Rfl:  .  hydrOXYzine (ATARAX/VISTARIL) 25 MG tablet, Take 0.5-1 tablets (12.5-25 mg total) by mouth every 8 (eight) hours as needed for itching., Disp: 30 tablet, Rfl: 0 .  LORazepam (ATIVAN) 1 MG tablet, Take 1 tablet (1 mg total) by mouth every 8 (eight) hours as needed for anxiety., Disp: 15 tablet, Rfl: 0 .  montelukast (SINGULAIR) 10 MG tablet, Take 10 mg by mouth at bedtime., Disp: , Rfl:  .  predniSONE (STERAPRED UNI-PAK 21 TAB) 10 MG (21) TBPK tablet, Take by mouth daily. Take as directed., Disp: 21 tablet, Rfl: 0 .  traZODone (DESYREL) 50 MG tablet, TAKE 1/2 TO 1 TABLET BY MOUTH (25-50MG) AT BEDTIME AS NEEDED FOR SLEEP, Disp: 90 tablet, Rfl: 1 .  triamcinolone cream (KENALOG) 0.1 %, Apply 1 application topically 2 (two) times daily., Disp: 30 g, Rfl: 0 .  benzonatate (TESSALON) 100 MG capsule, Take 1 capsule (100 mg total) by mouth 2 (two) times daily as needed for cough., Disp: 20 capsule, Rfl: 0 .  doxycycline (VIBRA-TABS) 100 MG tablet, Take 1 tablet (100 mg total) by mouth 2 (two) times daily., Disp: 14 tablet, Rfl: 0  EXAM:  VITALS per patient if applicable:  GENERAL: alert, oriented, appears well and in no acute distress  HEENT: atraumatic, conjunttiva clear, no obvious abnormalities on inspection of external nose and ears  NECK: normal movements of the head and neck  LUNGS: on inspection no signs of respiratory distress, breathing rate appears normal, no obvious gross SOB, gasping or wheezing, some coughing during visit - wet sounding cough  CV: no obvious cyanosis  MS: moves all visible extremities without noticeable abnormality  PSYCH/NEURO: pleasant and cooperative, no obvious depression or anxiety, speech and thought processing grossly intact  ASSESSMENT AND PLAN:  Discussed the following assessment and plan:  Cough  Moderate asthma, unspecified whether complicated, unspecified whether persistent  Coronavirus infection,  unspecified  Allergy, initial encounter  Depression, recurrent ([place])  -we discussed possible serious and likely etiologies, options for evaluation and workup, limitations of telemedicine visit vs in person visit, treatment, treatment risks and precautions. Pt prefers to treat via telemedicine empirically rather then risking or undertaking an in person visit at this moment. Given ongoing cough with discolored sputum added doxy 168m bid x 7 days for possible bacterial component. Tessalon for cough. Continue allergy and asthma regimen and advised follow up with her asthma allergy specialist. Advised counseling and discussed options for the depression symptoms- this also is sometime seen as a longer term symptoms in [place] She also mentioned so DUB at the end of the visit - sees gyn and advised she contact them for evaluation. Patient agrees to seek prompt in person care if worsening, new symptoms arise,  or if is not improving with treatment.  I discussed the assessment and treatment plan with the patient. The patient was provided an opportunity to ask questions and all were answered. The patient agreed with the plan and demonstrated an understanding of the instructions.   The patient was advised to call back or seek an in-person evaluation if the symptoms worsen or if the condition fails to improve as anticipated.   Lucretia Kern, DO

## 2019-09-21 DIAGNOSIS — J3081 Allergic rhinitis due to animal (cat) (dog) hair and dander: Secondary | ICD-10-CM | POA: Diagnosis not present

## 2019-09-21 DIAGNOSIS — J3089 Other allergic rhinitis: Secondary | ICD-10-CM | POA: Diagnosis not present

## 2019-09-21 DIAGNOSIS — J301 Allergic rhinitis due to pollen: Secondary | ICD-10-CM | POA: Diagnosis not present

## 2019-09-28 ENCOUNTER — Encounter: Payer: Self-pay | Admitting: Family Medicine

## 2019-09-28 ENCOUNTER — Telehealth (INDEPENDENT_AMBULATORY_CARE_PROVIDER_SITE_OTHER): Payer: BC Managed Care – PPO | Admitting: Family Medicine

## 2019-09-28 VITALS — Ht 62.0 in | Wt 140.0 lb

## 2019-09-28 DIAGNOSIS — F321 Major depressive disorder, single episode, moderate: Secondary | ICD-10-CM | POA: Diagnosis not present

## 2019-09-28 DIAGNOSIS — F419 Anxiety disorder, unspecified: Secondary | ICD-10-CM

## 2019-09-28 NOTE — Progress Notes (Signed)
Virtual Visit via Video Note  I connected with Sonia [name]  on 09/28/19 at  2:00 PM EDT by a video enabled telemedicine application and verified that I am speaking with the correct person using two identifiers.  Location patient: home Location provider:work or home office Persons participating in the virtual visit: patient, provider  I discussed the limitations of evaluation and management by telemedicine and the availability of in person appointments. The patient expressed understanding and agreed to proceed.   Sonia [name] DOB: 1986 Encounter date: 09/28/2019  This is a 34 y.o. female who presents with Chief Complaint  Patient presents with  . Follow-up    History of present illness: Dx with COVID 4/17 and then increased cough with worsening sputum, so doxy was added by Dr. Maudie Mercury 5/4.   Did really well after antibiotic. No more cough. No wheezing now.   Chest pain she was having is gone. No fevers.   Mood was really bad when she was at home. Has gotten better since she has been "out in the world". Still having hard time with motivation. Feeling like husband is doing  A lot at home because she isn't motivated (completing laundry, dishes)   Allergies  Allergen Reactions  . Other Other (See Comments)    Mold, mildew, grass, fungus, trees cause swelling of eyes   Current Meds  Medication Sig  . albuterol (VENTOLIN HFA) 108 (90 Base) MCG/ACT inhaler Inhale 1-2 puffs into the lungs every 4 (four) hours as needed for wheezing or shortness of breath.  . benzonatate (TESSALON) 100 MG capsule Take 1 capsule (100 mg total) by mouth 2 (two) times daily as needed for cough.  . budesonide-formoterol (SYMBICORT) 80-4.5 MCG/ACT inhaler Inhale 2 puffs into the lungs 2 (two) times a day.  Marland Kitchen buPROPion (WELLBUTRIN SR) 150 MG 12 hr tablet TAKE 1 TABLET BY MOUTH TWICE A DAY  . cetirizine (ZYRTEC) 10 MG tablet Take 10 mg by mouth daily.  . diphenhydrAMINE  (BENADRYL) 25 mg capsule Take 25 mg by mouth at bedtime as needed for allergies. For itching  . doxycycline (VIBRA-TABS) 100 MG tablet Take 1 tablet (100 mg total) by mouth 2 (two) times daily.  Marland Kitchen escitalopram (LEXAPRO) 10 MG tablet Take 1 tablet (10 mg total) by mouth daily.  . fluticasone (FLONASE) 50 MCG/ACT nasal spray Place 1 spray into both nostrils daily as needed for allergies or rhinitis.  . hydrOXYzine (ATARAX/VISTARIL) 25 MG tablet Take 0.5-1 tablets (12.5-25 mg total) by mouth every 8 (eight) hours as needed for itching.  Marland Kitchen LORazepam (ATIVAN) 1 MG tablet Take 1 tablet (1 mg total) by mouth every 8 (eight) hours as needed for anxiety.  . montelukast (SINGULAIR) 10 MG tablet Take 10 mg by mouth at bedtime.  . predniSONE (STERAPRED UNI-PAK 21 TAB) 10 MG (21) TBPK tablet Take by mouth daily. Take as directed.  . traZODone (DESYREL) 50 MG tablet TAKE 1/2 TO 1 TABLET BY MOUTH (25-50MG) AT BEDTIME AS NEEDED FOR SLEEP  . triamcinolone cream (KENALOG) 0.1 % Apply 1 application topically 2 (two) times daily.    Review of Systems  Constitutional: Negative for chills, fatigue and fever.  Respiratory: Negative for cough, chest tightness, shortness of breath and wheezing.   Cardiovascular: Negative for chest pain, palpitations and leg swelling.  Psychiatric/Behavioral: Positive for sleep disturbance. The patient is nervous/anxious.     Objective:  Ht 5' 2"  (1.575 m)   Wt 140 lb (63.5 kg)   LMP 09/05/2019 (  Exact Date)   BMI 25.61 kg/m   Weight: 140 lb (63.5 kg)   BP Readings from Last 3 Encounters:  09/14/19 (!) 96/54  08/26/19 111/78  08/05/19 99/71   Wt Readings from Last 3 Encounters:  09/28/19 140 lb (63.5 kg)  09/20/19 140 lb (63.5 kg)  09/14/19 140 lb (63.5 kg)    EXAM:  GENERAL: alert, oriented, appears well and in no acute distress  HEENT: atraumatic, conjunctiva clear, no obvious abnormalities on inspection of external nose and ears  NECK: normal movements of the  head and neck  LUNGS: on inspection no signs of respiratory distress, breathing rate appears normal, no obvious gross SOB, gasping or wheezing  CV: no obvious cyanosis  MS: moves all visible extremities without noticeable abnormality  PSYCH/NEURO: pleasant and cooperative, no obvious depression or anxiety, speech and thought processing grossly intact  Assessment/Plan  1. Moderate major depression ([place]) She is going to increase lexapro to 24m daily and update me in 2 weeks with this change. She did call [facility] last week and is waiting on a call back.  I have asked her to let me know if she does not hear from them by the end of this week.  She has been avoiding waiting on the scale, but is eating well (sometimes too much).  She has a difficult time stopping eating when she starts.  No purging.  2. Anxiety This has improved with current medications.   I discussed the assessment and treatment plan with the patient. The patient was provided an opportunity to ask questions and all were answered. The patient agreed with the plan and demonstrated an understanding of the instructions.   The patient was advised to call back or seek an in-person evaluation if the symptoms worsen or if the condition fails to improve as anticipated.  I provided 20 minutes of non-face-to-face time during this encounter.   JMicheline Rough MD

## 2019-10-12 DIAGNOSIS — J3081 Allergic rhinitis due to animal (cat) (dog) hair and dander: Secondary | ICD-10-CM | POA: Diagnosis not present

## 2019-10-12 DIAGNOSIS — J301 Allergic rhinitis due to pollen: Secondary | ICD-10-CM | POA: Diagnosis not present

## 2019-10-12 DIAGNOSIS — J3089 Other allergic rhinitis: Secondary | ICD-10-CM | POA: Diagnosis not present

## 2019-10-20 DIAGNOSIS — J301 Allergic rhinitis due to pollen: Secondary | ICD-10-CM | POA: Diagnosis not present

## 2019-10-20 DIAGNOSIS — J3089 Other allergic rhinitis: Secondary | ICD-10-CM | POA: Diagnosis not present

## 2019-10-20 DIAGNOSIS — J3081 Allergic rhinitis due to animal (cat) (dog) hair and dander: Secondary | ICD-10-CM | POA: Diagnosis not present

## 2019-11-10 DIAGNOSIS — J301 Allergic rhinitis due to pollen: Secondary | ICD-10-CM | POA: Diagnosis not present

## 2019-11-10 DIAGNOSIS — J3081 Allergic rhinitis due to animal (cat) (dog) hair and dander: Secondary | ICD-10-CM | POA: Diagnosis not present

## 2019-11-10 DIAGNOSIS — J3089 Other allergic rhinitis: Secondary | ICD-10-CM | POA: Diagnosis not present

## 2019-11-11 ENCOUNTER — Encounter: Payer: Self-pay | Admitting: Family Medicine

## 2019-11-15 DIAGNOSIS — J301 Allergic rhinitis due to pollen: Secondary | ICD-10-CM | POA: Diagnosis not present

## 2019-11-15 DIAGNOSIS — J3089 Other allergic rhinitis: Secondary | ICD-10-CM | POA: Diagnosis not present

## 2019-11-15 DIAGNOSIS — J3081 Allergic rhinitis due to animal (cat) (dog) hair and dander: Secondary | ICD-10-CM | POA: Diagnosis not present

## 2019-11-23 DIAGNOSIS — J3081 Allergic rhinitis due to animal (cat) (dog) hair and dander: Secondary | ICD-10-CM | POA: Diagnosis not present

## 2019-11-23 DIAGNOSIS — J3089 Other allergic rhinitis: Secondary | ICD-10-CM | POA: Diagnosis not present

## 2019-11-23 DIAGNOSIS — J301 Allergic rhinitis due to pollen: Secondary | ICD-10-CM | POA: Diagnosis not present

## 2019-11-28 NOTE — Telephone Encounter (Signed)
Pt's husband dropped off forms to be completed. Informed him of the 3-5 business day and possible fee.   Placed in red folder.  Pt would like a call at [phone] when completed-ok to leave a VM.

## 2019-11-30 NOTE — Telephone Encounter (Signed)
Paper work completed.

## 2019-12-05 ENCOUNTER — Other Ambulatory Visit: Payer: Self-pay | Admitting: Family Medicine

## 2019-12-05 DIAGNOSIS — J3081 Allergic rhinitis due to animal (cat) (dog) hair and dander: Secondary | ICD-10-CM | POA: Diagnosis not present

## 2019-12-05 DIAGNOSIS — J3089 Other allergic rhinitis: Secondary | ICD-10-CM | POA: Diagnosis not present

## 2019-12-05 DIAGNOSIS — F419 Anxiety disorder, unspecified: Secondary | ICD-10-CM

## 2019-12-05 DIAGNOSIS — J301 Allergic rhinitis due to pollen: Secondary | ICD-10-CM | POA: Diagnosis not present

## 2019-12-06 MED ORDER — BUPROPION HCL ER (SR) 150 MG PO TB12: 150.0000 mg | ORAL_TABLET | Freq: Two times a day (BID) | ORAL | 0 refills | Status: DC

## 2019-12-06 MED ORDER — ESCITALOPRAM OXALATE 10 MG PO TABS: 10.0000 mg | ORAL_TABLET | Freq: Every day | ORAL | 0 refills | Status: DC

## 2019-12-07 ENCOUNTER — Encounter (HOSPITAL_COMMUNITY): Payer: Self-pay

## 2019-12-07 ENCOUNTER — Emergency Department (HOSPITAL_COMMUNITY): Payer: BC Managed Care – PPO

## 2019-12-07 ENCOUNTER — Other Ambulatory Visit: Payer: Self-pay

## 2019-12-07 ENCOUNTER — Encounter: Payer: Self-pay | Admitting: Emergency Medicine

## 2019-12-07 ENCOUNTER — Emergency Department (HOSPITAL_COMMUNITY)
Admission: EM | Admit: 2019-12-07 | Discharge: 2019-12-07 | Disposition: A | Discharge status: Home / Self Care | Payer: BC Managed Care – PPO | Attending: Emergency Medicine | Admitting: Emergency Medicine

## 2019-12-07 ENCOUNTER — Encounter: Payer: Self-pay | Admitting: Family Medicine

## 2019-12-07 ENCOUNTER — Inpatient Hospital Stay
Admission: RE | Admit: 2019-12-07 | Discharge: 2019-12-07 | Disposition: A | Discharge status: Home / Self Care | Payer: BC Managed Care – PPO | Source: Ambulatory Visit

## 2019-12-07 ENCOUNTER — Ambulatory Visit
Admission: EM | Admit: 2019-12-07 | Discharge: 2019-12-07 | Disposition: A | Discharge status: Home / Self Care | Payer: BC Managed Care – PPO | Source: Home / Self Care

## 2019-12-07 DIAGNOSIS — R42 Dizziness and giddiness: Secondary | ICD-10-CM | POA: Diagnosis not present

## 2019-12-07 DIAGNOSIS — R55 Syncope and collapse: Secondary | ICD-10-CM | POA: Diagnosis not present

## 2019-12-07 DIAGNOSIS — Z8616 Personal history of COVID-19: Secondary | ICD-10-CM | POA: Insufficient documentation

## 2019-12-07 LAB — PREGNANCY, URINE: Preg Test, Ur: NEGATIVE

## 2019-12-07 LAB — COMPREHENSIVE METABOLIC PANEL
ALT: 22 U/L (ref 0–44)
AST: 20 U/L (ref 15–41)
Albumin: 4.2 g/dL (ref 3.5–5.0)
Alkaline Phosphatase: 22 U/L — ABNORMAL LOW (ref 38–126)
Anion gap: 9 (ref 5–15)
BUN: 10 mg/dL (ref 6–20)
CO2: 28 mmol/L (ref 22–32)
Calcium: 9.3 mg/dL (ref 8.9–10.3)
Chloride: 100 mmol/L (ref 98–111)
Creatinine, Ser: 0.93 mg/dL (ref 0.44–1.00)
GFR calc Af Amer: >60 mL/min (ref >60)
GFR calc non Af Amer: >60 mL/min (ref >60)
Glucose, Bld: 109 mg/dL — ABNORMAL HIGH (ref 70–99)
Potassium: 3.8 mmol/L (ref 3.5–5.1)
Sodium: 137 mmol/L (ref 135–145)
Total Bilirubin: 0.5 mg/dL (ref 0.3–1.2)
Total Protein: 6.7 g/dL (ref 6.5–8.1)

## 2019-12-07 LAB — CBC WITH DIFFERENTIAL/PLATELET
Abs Immature Granulocytes: 0.02 10*3/uL (ref 0.00–0.07)
Basophils Absolute: 0.1 10*3/uL (ref 0.0–0.1)
Basophils Relative: 1 %
Eosinophils Absolute: 0.1 10*3/uL (ref 0.0–0.5)
Eosinophils Relative: 2 %
HCT: 34.2 % — ABNORMAL LOW (ref 36.0–46.0)
Hemoglobin: 10.8 g/dL — ABNORMAL LOW (ref 12.0–15.0)
Immature Granulocytes: 0 %
Lymphocytes Relative: 24 %
Lymphs Abs: 1.4 10*3/uL (ref 0.7–4.0)
MCH: 27.6 pg (ref 26.0–34.0)
MCHC: 31.6 g/dL (ref 30.0–36.0)
MCV: 87.5 fL (ref 80.0–100.0)
Monocytes Absolute: 0.4 10*3/uL (ref 0.1–1.0)
Monocytes Relative: 8 %
Neutro Abs: 3.8 10*3/uL (ref 1.7–7.7)
Neutrophils Relative %: 65 %
Platelets: 327 10*3/uL (ref 150–400)
RBC: 3.91 MIL/uL (ref 3.87–5.11)
RDW: 13.5 % (ref 11.5–15.5)
WBC: 5.9 10*3/uL (ref 4.0–10.5)
nRBC: 0 % (ref 0.0–0.2)

## 2019-12-07 LAB — URINALYSIS, ROUTINE W REFLEX MICROSCOPIC
Bilirubin Urine: NEGATIVE
Glucose, UA: NEGATIVE mg/dL
Hgb urine dipstick: NEGATIVE
Ketones, ur: NEGATIVE mg/dL
Leukocytes,Ua: NEGATIVE
Nitrite: NEGATIVE
Protein, ur: NEGATIVE mg/dL
Specific Gravity, Urine: 1.005 (ref 1.005–1.030)
pH: 6 (ref 5.0–8.0)

## 2019-12-07 NOTE — ED Triage Notes (Signed)
Since Sunday has been feeling like she was going to pass out. Dizziness.  She was seeing floaters.  She is having pressure and "swishing" in her head.

## 2019-12-07 NOTE — ED Provider Notes (Signed)
[facility] Provider Note   CSN: [id number] Arrival date & time: 12/07/19  1356     History Chief Complaint  Patient presents with  . Near Syncope    Sonia [name] is a 34 y.o. female.  HPI   This patient is a pleasant 34 year old female, she has a known history of Crohn's colitis, she also has a history of prior endometriosis status post laparoscopic surgery and a history of 2 C-sections.  She has not seen any blood in her stools but recently has had some mucus, she does not take daily medicines for her Crohn's disease.  The patient also reports that she has a history of seasonal allergies and gets allergy shots regularly.  She presents today from the urgent care after she presented for a feeling of almost passing out.  She reports that over the last 4 days she has had multiple episodes where she will stand up and feel lightheaded, dizzy, like things are getting blackout, this is associated with a pressure in the head and "swooshing" in her ears.  She has never felt anything like this in the past, denies head injuries, denies dehydration and states that her appetite has been normal, hydration has been normal and her urine has been normal without dysuria hematuria or frequency.  She has not had any injuries to the head, she has not actually lost consciousness and last night she was able to walk for about 10 minutes with her kids throughout the neighborhood with only a baseline feeling of a dizziness but not near syncope.  She has had minimal headache that seems to come and go with this.  All of this is new when she has never experienced any this in the past.  As a side note the patient did develop Covid approximately 2 months ago, she was most recently treated with an antibiotic about 6 weeks ago after developing an ongoing productive cough after Covid infection.  She is no longer coughing or short of breath  Past Medical History:  Diagnosis Date  . COVID-19   .  Crohn's colitis ([place])   . Depression   . GERD (gastroesophageal reflux disease)    no meds  . Normal labor 03/27/2013  . Postpartum care following cesarean delivery (11/9) 03/27/2013    Patient Active Problem List   Diagnosis Date Noted  . Moderate persistent asthma 09/06/2019  . Insomnia 09/29/2018  . Bulimia nervosa, purging type 09/15/2018  . 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 Crohn's disease of colon without complication ([place]) 19/37/9024  . LUQ abdominal pain 03/01/2018  . Crohn's disease ([place]) 08/30/2014  . Headache disorder 08/30/2014  . Anxiety 06/18/2009  . GERD 05/23/2009    Past Surgical History:  Procedure Laterality Date  . CESAREAN SECTION N/A 03/27/2013   Procedure: CESAREAN SECTION;  Surgeon: Lovenia Kim, MD;  Location: [facility];  Service: Obstetrics;  Laterality: N/A;  . CESAREAN SECTION N/A 09/04/2014   Procedure: REPEAT CESAREAN SECTION;  Surgeon: Crawford Givens, MD;  Location: [facility];  Service: Obstetrics;  Laterality: N/A;  . HAND SURGERY  2012   with nerve block  . LAPAROSCOPIC ENDOMETRIOSIS FULGURATION    . WISDOM TOOTH EXTRACTION       OB History    Gravida  2   Para  2   Term  2   Preterm  0   AB  0   Living  2     SAB  0   TAB  0  Ectopic  0   Multiple  0   Live Births  2           Family History  Problem Relation Age of Onset  . COPD Mother   . Heart disease Mother        smoker  . Hernia Mother   . Diverticulitis Mother   . Kidney disease Mother   . Heart failure Mother   . Stroke Mother 22  . Osteoporosis Mother   . Anxiety disorder Mother        ? on xanax  . Heart disease Maternal Grandmother        smoker  . Arthritis-Osteo Maternal Grandmother   . Cancer Maternal Grandfather        throat  . Alcohol abuse Maternal Grandfather   . Other Father        pituitary gland tumor  . Depression Sister   . Anxiety disorder Sister   . Colon cancer Neg Hx     Social History   Tobacco Use  .  Smoking status: Never Smoker  . Smokeless tobacco: Former Systems developer    Types: Chew  Substance Use Topics  . Alcohol use: Yes    Comment: occasionally  . Drug use: No    Home Medications Prior to Admission medications   Medication Sig Start Date End Date Taking? Authorizing Provider  albuterol (VENTOLIN HFA) 108 (90 Base) MCG/ACT inhaler Inhale 1-2 puffs into the lungs every 4 (four) hours as needed for wheezing or shortness of breath. 08/26/19  Yes Jaynee Eagles, PA-C  budesonide-formoterol (SYMBICORT) 80-4.5 MCG/ACT inhaler Inhale 2 puffs into the lungs 2 (two) times a day.   Yes [provider]  buPROPion (WELLBUTRIN SR) 150 MG 12 hr tablet Take 1 tablet (150 mg total) by mouth 2 (two) times daily. 12/06/19  Yes Koberlein, Junell C, MD  cetirizine (ZYRTEC) 10 MG tablet Take 10 mg by mouth daily.   Yes [provider]  diphenhydrAMINE (BENADRYL) 25 mg capsule Take 25 mg by mouth at bedtime as needed for allergies. For itching   Yes [provider]  escitalopram (LEXAPRO) 10 MG tablet Take 1 tablet (10 mg total) by mouth daily. Patient taking differently: Take 10 mg by mouth in the morning and at bedtime.  12/06/19  Yes Koberlein, Junell C, MD  hydrocortisone 2.5 % cream Apply 1 application topically daily as needed (for itching-irritation).  12/05/19  Yes [provider]  montelukast (SINGULAIR) 10 MG tablet Take 10 mg by mouth at bedtime.   Yes [provider]  traZODone (DESYREL) 50 MG tablet TAKE 1/2 TO 1 TABLET BY MOUTH (25-50MG) AT BEDTIME AS NEEDED FOR SLEEP Patient taking differently: Take 50 mg by mouth at bedtime.  06/15/19  Yes Koberlein, Steele Berg, MD  dicyclomine (BENTYL) 10 MG capsule Take 1 capsule (10 mg total) by mouth 3 (three) times daily before meals. 03/29/18 09/14/19  Zehr, Laban Emperor, PA-C  Norgestimate-Eth Estradiol (SPRINTEC 28 PO) Take by mouth.  09/14/19  [provider]    Allergies    Other  Review of Systems   Review of  Systems  All other systems reviewed and are negative.   Physical Exam Updated Vital Signs BP 111/80   Pulse 69   Temp 98.4 F (36.9 C) (Oral)   Resp 13   Ht 1.575 m (5' 2" )   Wt 68 kg   LMP 12/02/2019   SpO2 100%   BMI 27.44 kg/m   Physical Exam Vitals  and nursing note reviewed.  Constitutional:      General: She is not in acute distress.    Appearance: She is well-developed.  HENT:     Head: Normocephalic and atraumatic.     Comments: Bilateral tympanic membranes are normal, the oropharynx is clear and moist, there is no redness erythema exudate or asymmetry.  Nasal passages are clear, tympanic membranes are clear bilaterally    Mouth/Throat:     Pharynx: No oropharyngeal exudate.  Eyes:     General: No scleral icterus.       Right eye: No discharge.        Left eye: No discharge.     Conjunctiva/sclera: Conjunctivae normal.     Pupils: Pupils are equal, round, and reactive to light.  Neck:     Thyroid: No thyromegaly.     Vascular: No JVD.  Cardiovascular:     Rate and Rhythm: Normal rate and regular rhythm.     Heart sounds: Normal heart sounds. No murmur heard.  No friction rub. No gallop.   Pulmonary:     Effort: Pulmonary effort is normal. No respiratory distress.     Breath sounds: Normal breath sounds. No wheezing or rales.  Abdominal:     General: Bowel sounds are normal. There is no distension.     Palpations: Abdomen is soft. There is no mass.     Tenderness: There is abdominal tenderness.     Comments: Minimal tenderness lower abdomen, normal bowel sounds, no guarding, no specific pain McBurney's point, no Murphy sign  Musculoskeletal:        General: No tenderness. Normal range of motion.     Cervical back: Normal range of motion and neck supple.  Lymphadenopathy:     Cervical: No cervical adenopathy.  Skin:    General: Skin is warm and dry.     Findings: No erythema or rash.  Neurological:     Mental Status: She is alert.     Coordination:  Coordination normal.     Comments: Speech is clear, cranial nerves III through XII are intact, memory is intact, strength is normal in all 4 extremities including grips, sensation is intact to light touch and pinprick in all 4 extremities. Coordination as tested by finger-nose-finger is normal, no limb ataxia. Normal gait, normal reflexes at the patellar tendons bilaterally  Psychiatric:        Behavior: Behavior normal.     ED Results / Procedures / Treatments   Labs (all labs ordered are listed, but only abnormal results are displayed) Labs Reviewed  CBC WITH DIFFERENTIAL/PLATELET - Abnormal; Notable for the following components:      Result Value   Hemoglobin 10.8 (*)    HCT 34.2 (*)    All other components within normal limits  COMPREHENSIVE METABOLIC PANEL - Abnormal; Notable for the following components:   Glucose, Bld 109 (*)    Alkaline Phosphatase 22 (*)    All other components within normal limits  URINALYSIS, ROUTINE W REFLEX MICROSCOPIC - Abnormal; Notable for the following components:   Color, Urine STRAW (*)    All other components within normal limits  PREGNANCY, URINE    EKG EKG Interpretation  Date/Time:  Wednesday December 07 2019 14:10:04 EDT Ventricular Rate:  70 PR Interval:  150 QRS Duration: 94 QT Interval:  372 QTC Calculation: 401 R Axis:   57 Text Interpretation: Normal sinus rhythm with sinus arrhythmia Septal infarct , age undetermined Abnormal ECG since last tracing no significant  change Confirmed by Noemi Chapel [phone]) on 12/07/2019 2:30:50 PM   Radiology No results found.  Procedures Procedures (including critical care time)  Medications Ordered in ED Medications - No data to display  ED Course  I have reviewed the triage vital signs and the nursing notes.  Pertinent labs & imaging results that were available during my care of the patient were reviewed by me and considered in my medical decision making (see chart for details).  Clinical  Course as of Dec 06 1799  Wed Dec 07, 2019  1703 This patient has a negative urinalysis, negative pregnancy, vital signs were reassuring, there was a slight increase in heart rate with standing but not to any significant degree.  Stable for discharge at this time.  May be Covid related   [BM]    Clinical Course User Index [BM] Noemi Chapel, MD   MDM Rules/Calculators/A&P                          This patient's EKG is normal, vital signs are normal, her blood pressure is 104/67 with a pulse of 86 and no ischemia or arrhythmia on the EKG.  She is afebrile, has normal oxygen levels and a totally normal exam.  Will rule out anemia, electrolyte disturbance, pregnancy or UTI.  Patient is agreeable.  This may be a post Covid dizziness  Labs unremarkable, patient stable for discharge  Final Clinical Impression(s) / ED Diagnoses Final diagnoses:  Near syncope    Rx / DC Orders ED Discharge Orders    None       Noemi Chapel, MD 12/07/19 1801

## 2019-12-07 NOTE — ED Triage Notes (Signed)
Advised to go to the ER for further evaluation.  Verbalized understanding.

## 2019-12-07 NOTE — Telephone Encounter (Signed)
If she hasn't picked up rx yet, the lexapro 42m should be BID with amount of 180 tablets so that she has 3 mo supply. Also ok for refills on bother wellbutrin and lexapro.   I will message her back through mychart for other portion of concerns.

## 2019-12-07 NOTE — ED Triage Notes (Signed)
Pt having near syncopal episodes daily since Sunday. Pt reports feeling dizzy and seeing black spots. Pt also reports having intermittent head pressure with swishing noises in bilateral ears. Worsens with movement, and bending over.    Sent by Urgent Care.

## 2019-12-07 NOTE — Discharge Instructions (Signed)
Your testing today looks good, there is no signs of heart problem, no signs of blood abnormalities, no signs of infection.  Please seek medical exam immediately for any severe or worsening symptoms otherwise follow-up with your doctor within the week for recheck.  I would encourage you to drink plenty of clear liquids, stand up slowly and take some time getting used to the standing position, this may be because of a slight drop in your blood pressure when you stand up, this could also be related to your infection that you had recently with Covid.  Emergency department for worsening symptoms

## 2019-12-07 NOTE — ED Notes (Signed)
Pt ambulatory to waiting room. Pt verbalized understanding of discharge instructions.   

## 2019-12-08 ENCOUNTER — Other Ambulatory Visit: Payer: Self-pay | Admitting: Family Medicine

## 2019-12-08 DIAGNOSIS — F419 Anxiety disorder, unspecified: Secondary | ICD-10-CM

## 2019-12-09 MED ORDER — ESCITALOPRAM OXALATE 10 MG PO TABS: 10.0000 mg | ORAL_TABLET | Freq: Two times a day (BID) | ORAL | 0 refills | Status: DC

## 2019-12-13 ENCOUNTER — Other Ambulatory Visit: Payer: Self-pay | Admitting: Family Medicine

## 2019-12-16 ENCOUNTER — Other Ambulatory Visit: Payer: Self-pay

## 2019-12-16 ENCOUNTER — Ambulatory Visit: Payer: BC Managed Care – PPO | Admitting: Family Medicine

## 2019-12-16 ENCOUNTER — Encounter: Payer: Self-pay | Admitting: Family Medicine

## 2019-12-16 VITALS — BP 100/62 | HR 77 | Temp 98.1°F | Ht 62.0 in | Wt 155.1 lb

## 2019-12-16 DIAGNOSIS — R519 Headache, unspecified: Secondary | ICD-10-CM | POA: Diagnosis not present

## 2019-12-16 DIAGNOSIS — J301 Allergic rhinitis due to pollen: Secondary | ICD-10-CM | POA: Diagnosis not present

## 2019-12-16 DIAGNOSIS — E559 Vitamin D deficiency, unspecified: Secondary | ICD-10-CM | POA: Diagnosis not present

## 2019-12-16 DIAGNOSIS — Z1159 Encounter for screening for other viral diseases: Secondary | ICD-10-CM | POA: Diagnosis not present

## 2019-12-16 DIAGNOSIS — D649 Anemia, unspecified: Secondary | ICD-10-CM | POA: Diagnosis not present

## 2019-12-16 DIAGNOSIS — J3089 Other allergic rhinitis: Secondary | ICD-10-CM | POA: Diagnosis not present

## 2019-12-16 DIAGNOSIS — R42 Dizziness and giddiness: Secondary | ICD-10-CM

## 2019-12-16 DIAGNOSIS — J3081 Allergic rhinitis due to animal (cat) (dog) hair and dander: Secondary | ICD-10-CM | POA: Diagnosis not present

## 2019-12-16 MED ORDER — KETOROLAC TROMETHAMINE 60 MG/2ML IM SOLN
60.0000 mg | Freq: Once | INTRAMUSCULAR | Status: AC
  Administered 2019-12-16: 60 mg via INTRAMUSCULAR

## 2019-12-16 MED ORDER — TRAZODONE HCL 50 MG PO TABS: 50.0000 mg | ORAL_TABLET | Freq: Every day | ORAL | 1 refills | Status: DC

## 2019-12-16 NOTE — Progress Notes (Signed)
Lucianne Yau DOB: Mar 16, 1986 Encounter date: 12/16/2019  This is a 34 y.o. female who presents with Chief Complaint  Patient presents with  . Anxiety    History of present illness: Just moved into new house.   Mom was in hospital again; close to death. Twice.   Grandmother in hospital with encephalopathy, then diverticulitis. Then septic and off life support yesterday.   Kids good, husband good. Working in [place]. Anxiety doing ok overall. Spent time in [place] to care for mom.   Sleep has been pretty good.   Breathing has been doing well. Has been coughing up more phlegm lately - thinks she breathed in some of mask other day. Using symbicort and albuterol daily. Anxiety makes her feel like she can't breathe.   In past week has felt like head is floating. Not spinning around. Went to urgent care 7/21 due to feeling syncopal. Coming out of allergy office after shot felt like whooshing sound. Has had some of whooshing sound for years, but not as frequent as now. Has had dizzy days every once in awhile but always attributed to allergies. Headache - mostly back of head and above eyebrows. Doesn't feel like sinuses. No ringing in ears. Hears whoosh of heart in both ears. Started to see some black/white spots. Has continued. Doesn't feel heart racing with episodes, but flutter ever once in awhile.   Periods are regular. 4-5 days. Start light, heavy, light. On worst days changing 4-5 times.   Allergies  Allergen Reactions  . Other Other (See Comments)    Mold, mildew, grass, fungus, trees cause swelling of eyes   Current Meds  Medication Sig  . albuterol (VENTOLIN HFA) 108 (90 Base) MCG/ACT inhaler Inhale 1-2 puffs into the lungs every 4 (four) hours as needed for wheezing or shortness of breath.  . budesonide-formoterol (SYMBICORT) 80-4.5 MCG/ACT inhaler Inhale 2 puffs into the lungs 2 (two) times a day.  Marland Kitchen buPROPion (WELLBUTRIN SR) 150 MG 12 hr tablet Take 1 tablet (150 mg  total) by mouth 2 (two) times daily.  . cetirizine (ZYRTEC) 10 MG tablet Take 10 mg by mouth daily.  . diphenhydrAMINE (BENADRYL) 25 mg capsule Take 25 mg by mouth at bedtime as needed for allergies. For itching  . escitalopram (LEXAPRO) 10 MG tablet Take 1 tablet (10 mg total) by mouth in the morning and at bedtime.  . hydrocortisone 2.5 % cream Apply 1 application topically daily as needed (for itching-irritation).   . montelukast (SINGULAIR) 10 MG tablet Take 10 mg by mouth at bedtime.  . traZODone (DESYREL) 50 MG tablet Take 1 tablet (50 mg total) by mouth at bedtime.  . [DISCONTINUED] traZODone (DESYREL) 50 MG tablet Take 1 tablet (50 mg total) by mouth at bedtime.    Review of Systems  Constitutional: Negative for chills, fatigue and fever.  HENT: Negative for congestion (baseline allergies), ear pain, sinus pressure (pressure above eyebrows; feels different) and sinus pain.   Respiratory: Negative for cough, chest tightness, shortness of breath and wheezing.   Cardiovascular: Negative for chest pain, palpitations and leg swelling.  Neurological: Positive for light-headedness.    Objective:  BP (!) 100/62 (BP Location: Left Arm, Patient Position: Sitting, Cuff Size: Normal)   Pulse 77   Temp 98.1 F (36.7 C) (Oral)   Ht 5' 2"  (1.575 m)   Wt 155 lb 1.6 oz (70.4 kg)   LMP 12/02/2019   BMI 28.37 kg/m   Weight: 155 lb 1.6 oz (70.4 kg)  BP Readings from Last 3 Encounters:  12/16/19 (!) 100/62  12/07/19 104/67  12/07/19 111/74   Wt Readings from Last 3 Encounters:  12/16/19 155 lb 1.6 oz (70.4 kg)  12/07/19 150 lb (68 kg)  12/07/19 150 lb (68 kg)    Physical Exam Constitutional:      General: She is not in acute distress.    Appearance: She is well-developed. She is not diaphoretic.  HENT:     Head: Normocephalic and atraumatic.     Right Ear: External ear normal.     Left Ear: External ear normal.  Eyes:     Conjunctiva/sclera: Conjunctivae normal.     Pupils:  Pupils are equal, round, and reactive to light.  Neck:     Thyroid: No thyromegaly.  Cardiovascular:     Rate and Rhythm: Normal rate and regular rhythm.     Heart sounds: Normal heart sounds. No murmur heard.  No friction rub. No gallop.      Comments: Orthostatic bp are within 2 points with all position changes Pulmonary:     Effort: Pulmonary effort is normal. No respiratory distress.     Breath sounds: Normal breath sounds. No wheezing or rales.  Musculoskeletal:     Cervical back: Neck supple.     Right lower leg: No edema.     Left lower leg: No edema.  Lymphadenopathy:     Cervical: No cervical adenopathy.  Skin:    General: Skin is warm and dry.  Neurological:     Mental Status: She is alert and oriented to person, place, and time.     Cranial Nerves: No cranial nerve deficit.     Motor: Motor function is intact. No abnormal muscle tone.     Gait: Gait is intact.     Deep Tendon Reflexes: Reflexes normal.     Reflex Scores:      Tricep reflexes are 2+ on the right side and 2+ on the left side.      Bicep reflexes are 2+ on the right side and 2+ on the left side.      Brachioradialis reflexes are 2+ on the right side and 2+ on the left side.      Patellar reflexes are 2+ on the right side and 2+ on the left side. Psychiatric:        Behavior: Behavior normal.     Assessment/Plan  1. Anemia, unspecified type We will recheck blood work.  Wonder if some of the sensitivity to changes in position is related to anemia.  Follow-up pending these results. - CBC with Differential/Platelet; Future - Vitamin B12; Future - Iron, TIBC and Ferritin Panel; Future  2. Light headedness See above.  Patient did ask after prior left if Botox could contribute to some of symptoms, so may have additional triggers for head pain.  Also would consider history of Covid infection since I have seen a number of patients with some dizziness status post Covid infection recovery.  Advise making sure  staying well-hydrated continue time position changes.  3. Encounter for hepatitis C screening test for low risk patient - Hepatitis C antibody; Future  4. Vitamin D deficiency We will check blood work.  5. Nonintractable headache, unspecified chronicity pattern, unspecified headache type - ketorolac (TORADOL) injection 60 mg  Return for pending bloodwork.    Micheline Rough, MD

## 2019-12-21 ENCOUNTER — Other Ambulatory Visit: Payer: BC Managed Care – PPO

## 2019-12-21 ENCOUNTER — Other Ambulatory Visit: Payer: Self-pay

## 2019-12-21 DIAGNOSIS — J3081 Allergic rhinitis due to animal (cat) (dog) hair and dander: Secondary | ICD-10-CM | POA: Diagnosis not present

## 2019-12-21 DIAGNOSIS — Z1159 Encounter for screening for other viral diseases: Secondary | ICD-10-CM | POA: Diagnosis not present

## 2019-12-21 DIAGNOSIS — D649 Anemia, unspecified: Secondary | ICD-10-CM

## 2019-12-21 DIAGNOSIS — J301 Allergic rhinitis due to pollen: Secondary | ICD-10-CM | POA: Diagnosis not present

## 2019-12-21 DIAGNOSIS — E538 Deficiency of other specified B group vitamins: Secondary | ICD-10-CM | POA: Diagnosis not present

## 2019-12-21 DIAGNOSIS — J3089 Other allergic rhinitis: Secondary | ICD-10-CM | POA: Diagnosis not present

## 2019-12-22 LAB — CBC WITH DIFFERENTIAL/PLATELET
Absolute Monocytes: 666 cells/uL (ref 200–950)
Basophils Absolute: 61 cells/uL (ref 0–200)
Basophils Relative: 0.9 %
Eosinophils Absolute: 82 cells/uL (ref 15–500)
Eosinophils Relative: 1.2 %
HCT: 35 % (ref 35.0–45.0)
Hemoglobin: 11.4 g/dL — ABNORMAL LOW (ref 11.7–15.5)
Lymphs Abs: 1448 cells/uL (ref 850–3900)
MCH: 27 pg (ref 27.0–33.0)
MCHC: 32.6 g/dL (ref 32.0–36.0)
MCV: 82.7 fL (ref 80.0–100.0)
MPV: 10.1 fL (ref 7.5–12.5)
Monocytes Relative: 9.8 %
Neutro Abs: 4542 cells/uL (ref 1500–7800)
Neutrophils Relative %: 66.8 %
Platelets: 338 10*3/uL (ref 140–400)
RBC: 4.23 10*6/uL (ref 3.80–5.10)
RDW: 13.2 % (ref 11.0–15.0)
Total Lymphocyte: 21.3 %
WBC: 6.8 10*3/uL (ref 3.8–10.8)

## 2019-12-22 LAB — HEPATITIS C ANTIBODY
Hepatitis C Ab: NONREACTIVE
SIGNAL TO CUT-OFF: 0.01 (ref <1.00)

## 2019-12-22 LAB — IRON,TIBC AND FERRITIN PANEL
%SAT: 10 % (calc) — ABNORMAL LOW (ref 16–45)
Ferritin: 4 ng/mL — ABNORMAL LOW (ref 16–154)
Iron: 36 ug/dL — ABNORMAL LOW (ref 40–190)
TIBC: 375 mcg/dL (calc) (ref 250–450)

## 2019-12-22 LAB — VITAMIN B12: Vitamin B-12: 350 pg/mL (ref 200–1100)

## 2019-12-23 NOTE — Addendum Note (Signed)
Addended by: Agnes Lawrence on: 12/23/2019 02:07 PM   Modules accepted: Orders

## 2019-12-26 ENCOUNTER — Ambulatory Visit
Admission: EM | Admit: 2019-12-26 | Discharge: 2019-12-26 | Disposition: A | Discharge status: Home / Self Care | Payer: BC Managed Care – PPO | Attending: Emergency Medicine | Admitting: Emergency Medicine

## 2019-12-26 ENCOUNTER — Other Ambulatory Visit: Payer: Self-pay

## 2019-12-26 DIAGNOSIS — H5789 Other specified disorders of eye and adnexa: Secondary | ICD-10-CM | POA: Diagnosis not present

## 2019-12-26 MED ORDER — OFLOXACIN 0.3 % OP SOLN: 1.0000 [drp] | Freq: Four times a day (QID) | OPHTHALMIC | 0 refills | Status: DC

## 2019-12-26 MED ORDER — OLOPATADINE HCL 0.2 % OP SOLN: 1.0000 [drp] | Freq: Every day | OPHTHALMIC | 0 refills | Status: DC | PRN

## 2019-12-26 NOTE — Discharge Instructions (Signed)
Use ofloxacin as prescribed and to completion Use [place] for eye itchiness Use OTC systane or genteal gel eye drops at night as needed for symptomatic relief Use OTC ibuprofen or tylenol as needed for pain relief Return here or follow up with ophthamolgy if symptoms persists or worsen such as fever, chills, redness, swelling, eye pain, painful eye movements, vision changes, etc..Marland Kitchen

## 2019-12-26 NOTE — ED Triage Notes (Signed)
Provider triage

## 2019-12-26 NOTE — ED Provider Notes (Signed)
[place]   [id number] 12/26/19 Arrival Time: 5621  CC: Eye irritation   SUBJECTIVE:  Jackie Kantner is a 34 y.o. female who presented to the urgent care with a complaint of right eye irritation.  Reported 2 insects get into her right eye.  Has tried OTC eye drops without relief.  Denies alleviating or aggravating factors.  Denies similar symptoms in the past.  Denies fever, chills, nausea, vomiting, eye pain, painful eye movements, halos, discharge, itching, vision changes, double vision, FB sensation, periorbital erythema.     Denies contact lens use.    ROS: As per HPI.  All other pertinent ROS negative.     Past Medical History:  Diagnosis Date   COVID-19    Crohn's colitis ([place])    Depression    GERD (gastroesophageal reflux disease)    no meds   Normal labor 03/27/2013   Postpartum care following cesarean delivery (11/9) 03/27/2013   Past Surgical History:  Procedure Laterality Date   CESAREAN SECTION N/A 03/27/2013   Procedure: CESAREAN SECTION;  Surgeon: Lovenia Kim, MD;  Location: [facility];  Service: Obstetrics;  Laterality: N/A;   CESAREAN SECTION N/A 09/04/2014   Procedure: REPEAT CESAREAN SECTION;  Surgeon: Crawford Givens, MD;  Location: [facility];  Service: Obstetrics;  Laterality: N/A;   HAND SURGERY  2012   with nerve block   LAPAROSCOPIC ENDOMETRIOSIS FULGURATION     WISDOM TOOTH EXTRACTION     Allergies  Allergen Reactions   Other Other (See Comments)    Mold, mildew, grass, fungus, trees cause swelling of eyes   No current facility-administered medications on file prior to encounter.   Current Outpatient Medications on File Prior to Encounter  Medication Sig Dispense Refill   albuterol (VENTOLIN HFA) 108 (90 Base) MCG/ACT inhaler Inhale 1-2 puffs into the lungs every 4 (four) hours as needed for wheezing or shortness of breath. 18 g 0   budesonide-formoterol (SYMBICORT) 80-4.5 MCG/ACT inhaler Inhale 2 puffs into the lungs 2 (two)  times a day.     buPROPion (WELLBUTRIN SR) 150 MG 12 hr tablet Take 1 tablet (150 mg total) by mouth 2 (two) times daily. 180 tablet 0   cetirizine (ZYRTEC) 10 MG tablet Take 10 mg by mouth daily.     diphenhydrAMINE (BENADRYL) 25 mg capsule Take 25 mg by mouth at bedtime as needed for allergies. For itching     escitalopram (LEXAPRO) 10 MG tablet Take 1 tablet (10 mg total) by mouth in the morning and at bedtime. 180 tablet 0   hydrocortisone 2.5 % cream Apply 1 application topically daily as needed (for itching-irritation).      montelukast (SINGULAIR) 10 MG tablet Take 10 mg by mouth at bedtime.     traZODone (DESYREL) 50 MG tablet Take 1 tablet (50 mg total) by mouth at bedtime. 90 tablet 1   [DISCONTINUED] dicyclomine (BENTYL) 10 MG capsule Take 1 capsule (10 mg total) by mouth 3 (three) times daily before meals. 90 capsule 2   [DISCONTINUED] Norgestimate-Eth Estradiol (SPRINTEC 28 PO) Take by mouth.     Social History   Socioeconomic History   Marital status: Married    Spouse name: Not on file   Number of children: Not on file   Years of education: Not on file   Highest education level: Not on file  Occupational History   Not on file  Tobacco Use   Smoking status: Never Smoker   Smokeless tobacco: Former Systems developer    Types:  Chew  Substance and Sexual Activity   Alcohol use: Yes    Comment: occasionally   Drug use: No   Sexual activity: Yes    Birth control/protection: None  Other Topics Concern   Not on file  Social History Narrative   Not on file   Social Determinants of Health   Financial Resource Strain:    Difficulty of Paying Living Expenses:   Food Insecurity:    Worried About Charity fundraiser in the Last Year:    Arboriculturist in the Last Year:   Transportation Needs:    Film/video editor (Medical):    Lack of Transportation (Non-Medical):   Physical Activity:    Days of Exercise per Week:    Minutes of Exercise per  Session:   Stress:    Feeling of Stress :   Social Connections:    Frequency of Communication with Friends and Family:    Frequency of Social Gatherings with Friends and Family:    Attends Religious Services:    Active Member of Clubs or Organizations:    Attends Music therapist:    Marital Status:   Intimate Partner Violence:    Fear of Current or Ex-Partner:    Emotionally Abused:    Physically Abused:    Sexually Abused:    Family History  Problem Relation Age of Onset   COPD Mother    Heart disease Mother        smoker   Hernia Mother    Diverticulitis Mother    Kidney disease Mother    Heart failure Mother    Stroke Mother 40   Osteoporosis Mother    Anxiety disorder Mother        ? on xanax   Heart disease Maternal Grandmother        smoker   Arthritis-Osteo Maternal Grandmother    Cancer Maternal Grandfather        throat   Alcohol abuse Maternal Grandfather    Other Father        pituitary gland tumor   Depression Sister    Anxiety disorder Sister    Colon cancer Neg Hx     OBJECTIVE:    Visual Acuity  Right Eye Distance:   Left Eye Distance:   Bilateral Distance:    Right Eye Near:   Left Eye Near:    Bilateral Near:      Vitals:   12/26/19 1846  BP: 102/73  Pulse: 69  Resp: 17  Temp: 98.6 F (37 C)  TempSrc: Oral  SpO2: 98%    General appearance: alert; no distress Eyes: No conjunctival erythema. PERRL; EOMI without discomfort;  no obvious drainage; lid everted without obvious FB;  Neck: supple Lungs: clear to auscultation bilaterally Heart: regular rate and rhythm Skin: warm and dry Psychological: alert and cooperative; normal mood and affect   ASSESSMENT & PLAN:  1. Irritation of right eye     Meds ordered this encounter  Medications   ofloxacin (OCUFLOX) 0.3 % ophthalmic solution    Sig: Place 1 drop into the right eye 4 (four) times daily.    Dispense:  5 mL    Refill:  0    Olopatadine HCl 0.2 % SOLN    Sig: Apply 1 drop to eye daily as needed.    Dispense:  2.5 mL    Refill:  0     Discharge Instructions Use ofloxacin as prescribed and to completion Use Pataday for  eye itchiness Use OTC systane or genteal gel eye drops at night as needed for symptomatic relief Use OTC ibuprofen or tylenol as needed for pain relief Return here or follow up with ophthamolgy if symptoms persists or worsen such as fever, chills, redness, swelling, eye pain, painful eye movements, vision changes, etc...  Reviewed expectations re: course of current medical issues. Questions answered. Outlined signs and symptoms indicating need for more acute intervention. Patient verbalized understanding. After Visit Summary given.    Note: This document was prepared using Dragon voice recognition software and may include unintentional dictation errors.    Emerson Monte, FNP 12/26/19 1932

## 2019-12-28 DIAGNOSIS — J3089 Other allergic rhinitis: Secondary | ICD-10-CM | POA: Diagnosis not present

## 2019-12-28 DIAGNOSIS — J301 Allergic rhinitis due to pollen: Secondary | ICD-10-CM | POA: Diagnosis not present

## 2019-12-28 DIAGNOSIS — J3081 Allergic rhinitis due to animal (cat) (dog) hair and dander: Secondary | ICD-10-CM | POA: Diagnosis not present

## 2020-01-04 DIAGNOSIS — J3081 Allergic rhinitis due to animal (cat) (dog) hair and dander: Secondary | ICD-10-CM | POA: Diagnosis not present

## 2020-01-04 DIAGNOSIS — J301 Allergic rhinitis due to pollen: Secondary | ICD-10-CM | POA: Diagnosis not present

## 2020-01-04 DIAGNOSIS — J3089 Other allergic rhinitis: Secondary | ICD-10-CM | POA: Diagnosis not present

## 2020-01-10 ENCOUNTER — Ambulatory Visit (HOSPITAL_COMMUNITY): Payer: BC Managed Care – PPO | Admitting: Hematology

## 2020-01-16 DIAGNOSIS — J301 Allergic rhinitis due to pollen: Secondary | ICD-10-CM | POA: Diagnosis not present

## 2020-01-16 DIAGNOSIS — J3081 Allergic rhinitis due to animal (cat) (dog) hair and dander: Secondary | ICD-10-CM | POA: Diagnosis not present

## 2020-01-16 DIAGNOSIS — J3089 Other allergic rhinitis: Secondary | ICD-10-CM | POA: Diagnosis not present

## 2020-01-24 ENCOUNTER — Inpatient Hospital Stay (HOSPITAL_COMMUNITY): Payer: BC Managed Care – PPO | Admitting: Hematology

## 2020-01-26 DIAGNOSIS — J3089 Other allergic rhinitis: Secondary | ICD-10-CM | POA: Diagnosis not present

## 2020-01-26 DIAGNOSIS — J3081 Allergic rhinitis due to animal (cat) (dog) hair and dander: Secondary | ICD-10-CM | POA: Diagnosis not present

## 2020-01-26 DIAGNOSIS — J301 Allergic rhinitis due to pollen: Secondary | ICD-10-CM | POA: Diagnosis not present

## 2020-02-03 DIAGNOSIS — J301 Allergic rhinitis due to pollen: Secondary | ICD-10-CM | POA: Diagnosis not present

## 2020-02-03 DIAGNOSIS — J3089 Other allergic rhinitis: Secondary | ICD-10-CM | POA: Diagnosis not present

## 2020-02-03 DIAGNOSIS — J3081 Allergic rhinitis due to animal (cat) (dog) hair and dander: Secondary | ICD-10-CM | POA: Diagnosis not present

## 2020-02-08 DIAGNOSIS — J3089 Other allergic rhinitis: Secondary | ICD-10-CM | POA: Diagnosis not present

## 2020-02-08 DIAGNOSIS — J3081 Allergic rhinitis due to animal (cat) (dog) hair and dander: Secondary | ICD-10-CM | POA: Diagnosis not present

## 2020-02-08 DIAGNOSIS — J301 Allergic rhinitis due to pollen: Secondary | ICD-10-CM | POA: Diagnosis not present

## 2020-02-14 ENCOUNTER — Encounter (HOSPITAL_COMMUNITY): Payer: Self-pay | Admitting: Hematology

## 2020-02-14 ENCOUNTER — Inpatient Hospital Stay (HOSPITAL_COMMUNITY): Payer: BC Managed Care – PPO

## 2020-02-14 ENCOUNTER — Other Ambulatory Visit: Payer: Self-pay

## 2020-02-14 ENCOUNTER — Inpatient Hospital Stay (HOSPITAL_COMMUNITY): Payer: BC Managed Care – PPO | Attending: Hematology | Admitting: Hematology

## 2020-02-14 VITALS — BP 102/71 | HR 64 | Temp 99.0°F | Resp 16 | Ht 62.0 in | Wt 151.2 lb

## 2020-02-14 DIAGNOSIS — Z8616 Personal history of COVID-19: Secondary | ICD-10-CM | POA: Diagnosis not present

## 2020-02-14 DIAGNOSIS — K50911 Crohn's disease, unspecified, with rectal bleeding: Secondary | ICD-10-CM | POA: Diagnosis not present

## 2020-02-14 DIAGNOSIS — D509 Iron deficiency anemia, unspecified: Secondary | ICD-10-CM | POA: Insufficient documentation

## 2020-02-14 DIAGNOSIS — D500 Iron deficiency anemia secondary to blood loss (chronic): Secondary | ICD-10-CM | POA: Insufficient documentation

## 2020-02-14 LAB — CBC WITH DIFFERENTIAL/PLATELET
Abs Immature Granulocytes: 0.02 10*3/uL (ref 0.00–0.07)
Basophils Absolute: 0.1 10*3/uL (ref 0.0–0.1)
Basophils Relative: 1 %
Eosinophils Absolute: 0.1 10*3/uL (ref 0.0–0.5)
Eosinophils Relative: 1 %
HCT: 37.6 % (ref 36.0–46.0)
Hemoglobin: 11.6 g/dL — ABNORMAL LOW (ref 12.0–15.0)
Immature Granulocytes: 0 %
Lymphocytes Relative: 21 %
Lymphs Abs: 1.6 10*3/uL (ref 0.7–4.0)
MCH: 26.1 pg (ref 26.0–34.0)
MCHC: 30.9 g/dL (ref 30.0–36.0)
MCV: 84.7 fL (ref 80.0–100.0)
Monocytes Absolute: 0.6 10*3/uL (ref 0.1–1.0)
Monocytes Relative: 9 %
Neutro Abs: 5.1 10*3/uL (ref 1.7–7.7)
Neutrophils Relative %: 68 %
Platelets: 338 10*3/uL (ref 150–400)
RBC: 4.44 MIL/uL (ref 3.87–5.11)
RDW: 14.5 % (ref 11.5–15.5)
WBC: 7.5 10*3/uL (ref 4.0–10.5)
nRBC: 0 % (ref 0.0–0.2)

## 2020-02-14 LAB — IRON AND TIBC
Iron: 22 ug/dL — ABNORMAL LOW (ref 28–170)
Saturation Ratios: 5 % — ABNORMAL LOW (ref 10.4–31.8)
TIBC: 411 ug/dL (ref 250–450)
UIBC: 389 ug/dL

## 2020-02-14 LAB — RETICULOCYTES
Immature Retic Fract: 11.4 % (ref 2.3–15.9)
RBC.: 4.4 MIL/uL (ref 3.87–5.11)
Retic Count, Absolute: 48 10*3/uL (ref 19.0–186.0)
Retic Ct Pct: 1.1 % (ref 0.4–3.1)

## 2020-02-14 LAB — FOLATE: Folate: 7.6 ng/mL (ref >5.9)

## 2020-02-14 LAB — LACTATE DEHYDROGENASE: LDH: 129 U/L (ref 98–192)

## 2020-02-14 LAB — FERRITIN: Ferritin: 4 ng/mL — ABNORMAL LOW (ref 11–307)

## 2020-02-14 NOTE — Progress Notes (Signed)
[facility] [address] [place] [address]   CLINIC:  Medical Oncology/Hematology  Patient Care Team: Caren Macadam, MD as PCP - General (Family Medicine) Crawford Givens, MD as Consulting Physician (Obstetrics and Gynecology)  CHIEF COMPLAINTS/PURPOSE OF CONSULTATION:  Evaluation of anemia  HISTORY OF PRESENTING ILLNESS:  Sonia [name] 34 y.o. female is here because of evaluation of anemia, at the request of Dr. Micheline Rough, MD from [facility]. Her hemoglobin on 8/4 was 11.4 and ferritin was 4.  Today she reports that she had COVID in April 2021 and has been having dizzy spells and throbbing sensation in her head since June 2021, especially when changing positions and turning her head quickly. She was diagnosed with Crohn's in 2009 and was treated with Pentasa and Bentyl; she has not taken iron tablets since she has Crohn's; she has never had an iron infusion or blood transfusion. Her last flare-up was in 2018 and she feels when one is starting since she gets dull LUQ pain, then gets diarrhea and bloody stool. She reports feeling fatigued. She denies having any recent F/C, night sweats, or unexplained weight loss. She denies being pregnant currently.  She used to work at [employer], but know is staying at home. She is a non-smoker. Her mother has anemia due to numerous chronic medical issues; her maternal great aunts had breast cancer; her maternal GF had throat cancer from smoking.    MEDICAL HISTORY:  Past Medical History:  Diagnosis Date  . COVID-19   . Crohn's colitis ([place])   . Depression   . GERD (gastroesophageal reflux disease)    no meds  . Normal labor 03/27/2013  . Postpartum care following cesarean delivery (11/9) 03/27/2013    SURGICAL HISTORY: Past Surgical History:  Procedure Laterality Date  . CESAREAN SECTION N/A 03/27/2013   Procedure: CESAREAN SECTION;  Surgeon: Lovenia Kim, MD;  Location: [facility];  Service:  Obstetrics;  Laterality: N/A;  . CESAREAN SECTION N/A 09/04/2014   Procedure: REPEAT CESAREAN SECTION;  Surgeon: Crawford Givens, MD;  Location: [facility];  Service: Obstetrics;  Laterality: N/A;  . HAND SURGERY  2012   with nerve block  . LAPAROSCOPIC ENDOMETRIOSIS FULGURATION    . WISDOM TOOTH EXTRACTION      SOCIAL HISTORY: Social History   Socioeconomic History  . Marital status: Married    Spouse name: Not on file  . Number of children: 2  . Years of education: Not on file  . Highest education level: Not on file  Occupational History  . Occupation: stay at home mother  Tobacco Use  . Smoking status: Never Smoker  . Smokeless tobacco: Former Systems developer    Types: Secondary school teacher  . Vaping Use: Never used  Substance and Sexual Activity  . Alcohol use: Yes    Comment: occasionally  . Drug use: No  . Sexual activity: Yes    Birth control/protection: None  Other Topics Concern  . Not on file  Social History Narrative  . Not on file   Social Determinants of Health   Financial Resource Strain:   . Difficulty of Paying Living Expenses: Not on file  Food Insecurity:   . Worried About Charity fundraiser in the Last Year: Not on file  . Ran Out of Food in the Last Year: Not on file  Transportation Needs:   . Lack of Transportation (Medical): Not on file  . Lack of Transportation (Non-Medical): Not on file  Physical Activity:   . Days of Exercise per Week: Not on file  . Minutes of Exercise per Session: Not on file  Stress:   . Feeling of Stress : Not on file  Social Connections:   . Frequency of Communication with Friends and Family: Not on file  . Frequency of Social Gatherings with Friends and Family: Not on file  . Attends Religious Services: Not on file  . Active Member of Clubs or Organizations: Not on file  . Attends Archivist Meetings: Not on file  . Marital Status: Not on file  Intimate Partner Violence:   . Fear of Current or Ex-Partner: Not on file  .  Emotionally Abused: Not on file  . Physically Abused: Not on file  . Sexually Abused: Not on file    FAMILY HISTORY: Family History  Problem Relation Age of Onset  . COPD Mother   . Heart disease Mother        smoker  . Hernia Mother   . Diverticulitis Mother   . Kidney disease Mother   . Heart failure Mother   . Stroke Mother 52  . Osteoporosis Mother   . Anxiety disorder Mother        ? on xanax  . Rheum arthritis Mother   . Heart disease Maternal Grandmother        smoker  . Arthritis-Osteo Maternal Grandmother   . Cancer Maternal Grandfather        throat  . Alcohol abuse Maternal Grandfather   . Other Father        pituitary gland tumor  . Depression Sister   . Anxiety disorder Sister   . Colon cancer Neg Hx     ALLERGIES:  is allergic to other.  MEDICATIONS:  Current Outpatient Medications  Medication Sig Dispense Refill  . albuterol (VENTOLIN HFA) 108 (90 Base) MCG/ACT inhaler Inhale 1-2 puffs into the lungs every 4 (four) hours as needed for wheezing or shortness of breath. 18 g 0  . budesonide-formoterol (SYMBICORT) 80-4.5 MCG/ACT inhaler Inhale 2 puffs into the lungs 2 (two) times a day.    Marland Kitchen buPROPion (WELLBUTRIN SR) 150 MG 12 hr tablet Take 1 tablet (150 mg total) by mouth 2 (two) times daily. 180 tablet 0  . cetirizine (ZYRTEC) 10 MG tablet Take 10 mg by mouth daily.    . diphenhydrAMINE (BENADRYL) 25 mg capsule Take 25 mg by mouth at bedtime as needed for allergies. For itching    . escitalopram (LEXAPRO) 10 MG tablet Take 1 tablet (10 mg total) by mouth in the morning and at bedtime. 180 tablet 0  . hydrocortisone 2.5 % cream Apply 1 application topically daily as needed (for itching-irritation).     . montelukast (SINGULAIR) 10 MG tablet Take 10 mg by mouth at bedtime.    Marland Kitchen ofloxacin (OCUFLOX) 0.3 % ophthalmic solution Place 1 drop into the right eye 4 (four) times daily. 5 mL 0  . Olopatadine HCl 0.2 % SOLN Apply 1 drop to eye daily as needed. 2.5 mL 0   . traZODone (DESYREL) 50 MG tablet Take 1 tablet (50 mg total) by mouth at bedtime. 90 tablet 1   No current facility-administered medications for this visit.    REVIEW OF SYSTEMS:   Review of Systems  Constitutional: Positive for fatigue (25%). Negative for appetite change, chills, diaphoresis, fever and unexpected weight change.  Respiratory: Positive for shortness of breath.   Cardiovascular: Positive for palpitations.  Neurological: Positive for dizziness  and headaches.  Psychiatric/Behavioral: Positive for sleep disturbance. The patient is nervous/anxious.      PHYSICAL EXAMINATION: ECOG PERFORMANCE STATUS: 0 - Asymptomatic  Vitals:   02/14/20 1336  BP: 102/71  Pulse: 64  Resp: 16  Temp: 99 F (37.2 C)  SpO2: 100%   Filed Weights   02/14/20 1336  Weight: 151 lb 3.2 oz (68.6 kg)   Physical Exam Vitals reviewed.  Constitutional:      Appearance: Normal appearance.  Cardiovascular:     Rate and Rhythm: Normal rate and regular rhythm.     Pulses: Normal pulses.     Heart sounds: Normal heart sounds.  Pulmonary:     Effort: Pulmonary effort is normal.     Breath sounds: Normal breath sounds.  Abdominal:     Palpations: Abdomen is soft. There is no hepatomegaly, splenomegaly or mass.     Tenderness: There is no abdominal tenderness.     Hernia: No hernia is present.  Lymphadenopathy:     Cervical: No cervical adenopathy.     Upper Body:     Right upper body: No axillary or pectoral adenopathy.     Left upper body: No axillary or pectoral adenopathy.     Lower Body: No right inguinal adenopathy. No left inguinal adenopathy.  Neurological:     General: No focal deficit present.     Mental Status: She is alert and oriented to person, place, and time.  Psychiatric:        Mood and Affect: Mood normal.        Behavior: Behavior normal.      LABORATORY DATA:  I have reviewed the data as listed Recent Results (from the past 2160 hour(s))  Urinalysis, Routine  w reflex microscopic     Status: Abnormal   Collection Time: 12/07/19  2:48 PM  Result Value Ref Range   Color, Urine STRAW (A) YELLOW   APPearance CLEAR CLEAR   Specific Gravity, Urine 1.005 1.005 - 1.030   pH 6.0 5.0 - 8.0   Glucose, UA NEGATIVE NEGATIVE mg/dL   Hgb urine dipstick NEGATIVE NEGATIVE   Bilirubin Urine NEGATIVE NEGATIVE   Ketones, ur NEGATIVE NEGATIVE mg/dL   Protein, ur NEGATIVE NEGATIVE mg/dL   Nitrite NEGATIVE NEGATIVE   Leukocytes,Ua NEGATIVE NEGATIVE    Comment: Performed at [facility], [address] [place], [place] [address]  Pregnancy, urine     Status: None   Collection Time: 12/07/19  2:48 PM  Result Value Ref Range   Preg Test, Ur NEGATIVE NEGATIVE    Comment:        THE SENSITIVITY OF THIS METHODOLOGY IS >20 mIU/mL. Performed at [facility], [address] [place], [place] [address]   CBC with Differential/Platelet     Status: Abnormal   Collection Time: 12/07/19  2:59 PM  Result Value Ref Range   WBC 5.9 4.0 - 10.5 K/uL   RBC 3.91 3.87 - 5.11 MIL/uL   Hemoglobin 10.8 (L) 12.0 - 15.0 g/dL   HCT 34.2 (L) 36 - 46 %   MCV 87.5 80.0 - 100.0 fL   MCH 27.6 26.0 - 34.0 pg   MCHC 31.6 30.0 - 36.0 g/dL   RDW 13.5 11.5 - 15.5 %   Platelets 327 150 - 400 K/uL   nRBC 0.0 0.0 - 0.2 %   Neutrophils Relative % 65 %   Neutro Abs 3.8 1.7 - 7.7 K/uL   Lymphocytes Relative 24 %   Lymphs Abs 1.4 0.7 -  4.0 K/uL   Monocytes Relative 8 %   Monocytes Absolute 0.4 0 - 1 K/uL   Eosinophils Relative 2 %   Eosinophils Absolute 0.1 0 - 0 K/uL   Basophils Relative 1 %   Basophils Absolute 0.1 0 - 0 K/uL   Immature Granulocytes 0 %   Abs Immature Granulocytes 0.02 0.00 - 0.07 K/uL    Comment: Performed at [facility], [address], [place],  [address]  Comprehensive metabolic panel     Status: Abnormal   Collection Time: 12/07/19  2:59 PM  Result Value Ref Range   Sodium 137 135 - 145 mmol/L   Potassium 3.8 3.5 - 5.1 mmol/L   Chloride 100 98 - 111  mmol/L   CO2 28 22 - 32 mmol/L   Glucose, Bld 109 (H) 70 - 99 mg/dL    Comment: Glucose reference range applies only to samples taken after fasting for at least 8 hours.   BUN 10 6 - 20 mg/dL   Creatinine, Ser 0.93 0.44 - 1.00 mg/dL   Calcium 9.3 8.9 - 10.3 mg/dL   Total Protein 6.7 6.5 - 8.1 g/dL   Albumin 4.2 3.5 - 5.0 g/dL   AST 20 15 - 41 U/L   ALT 22 0 - 44 U/L   Alkaline Phosphatase 22 (L) 38 - 126 U/L   Total Bilirubin 0.5 0.3 - 1.2 mg/dL   GFR calc non Af Amer >60 >60 mL/min   GFR calc Af Amer >60 >60 mL/min   Anion gap 9 5 - 15    Comment: Performed at [facility], [address], [place], [place] [address]  Iron, TIBC and Ferritin Panel     Status: Abnormal   Collection Time: 12/21/19  2:49 PM  Result Value Ref Range   Iron 36 (L) 40 - 190 mcg/dL   TIBC 375 250 - 450 mcg/dL (calc)   %SAT 10 (L) 16 - 45 % (calc)   Ferritin 4 (L) 16 - 154 ng/mL  CBC with Differential/Platelet     Status: Abnormal   Collection Time: 12/21/19  2:49 PM  Result Value Ref Range   WBC 6.8 3.8 - 10.8 Thousand/uL   RBC 4.23 3.80 - 5.10 Million/uL   Hemoglobin 11.4 (L) 11.7 - 15.5 g/dL   HCT 35.0 35 - 45 %   MCV 82.7 80.0 - 100.0 fL   MCH 27.0 27.0 - 33.0 pg   MCHC 32.6 32.0 - 36.0 g/dL   RDW 13.2 11.0 - 15.0 %   Platelets 338 140 - 400 Thousand/uL   MPV 10.1 7.5 - 12.5 fL   Neutro Abs 4,542 1,500 - 7,800 cells/uL   Lymphs Abs 1,448 850 - 3,900 cells/uL   Absolute Monocytes 666 200 - 950 cells/uL   Eosinophils Absolute 82 15 - 500 cells/uL   Basophils Absolute 61 0 - 200 cells/uL   Neutrophils Relative % 66.8 %   Total Lymphocyte 21.3 %   Monocytes Relative 9.8 %   Eosinophils Relative 1.2 %   Basophils Relative 0.9 %  Vitamin B12     Status: None   Collection Time: 12/21/19  2:49 PM  Result Value Ref Range   Vitamin B-12 350 200 - 1,100 pg/mL    Comment: . Please Note: Although the reference range for vitamin B12 is [phone] pg/mL, it has been reported that between 5 and 10% of  patients with values between 200 and 400 pg/mL may experience neuropsychiatric and hematologic abnormalities due to occult  B12 deficiency; less than 1% of patients with values above 400 pg/mL will have symptoms. .   Hepatitis C antibody     Status: None   Collection Time: 12/21/19  2:49 PM  Result Value Ref Range   Hepatitis C Ab NON-REACTIVE NON-REACTI   SIGNAL TO CUT-OFF 0.01 <1.00    Comment: . HCV antibody was non-reactive. There is no laboratory  evidence of HCV infection. . In most cases, no further action is required. However, if recent HCV exposure is suspected, a test for HCV RNA (test code [phone]) is suggested. . For additional information please refer to http://education.questdiagnostics.com/faq/FAQ22v1 (This link is being provided for informational/ educational purposes only.) .     RADIOGRAPHIC STUDIES: I have personally reviewed the radiological images as listed and agreed with the findings in the report.  ASSESSMENT:  1.  Iron deficiency anemia: -CBC on 12/21/2019 with hemoglobin 11.4 and ferritin of 5. -Complains of dizzy spells for the last 3 months.  Also complains of feeling tired. -No fevers, night sweats or weight loss. -Covid infection in April 2021.  No prior history of transfusion or intravenous iron therapy.  2.  Crohn's disease: -Diagnosed in 2009. -She was treated with Pentasa and Bentyl in the past.  She takes Bentyl as needed. -Flareups include dull pain in the left upper quadrant, nausea, diarrhea and bleeding.  Last episode 3 years ago.  3.  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  She is currently staying at home.  Non-smoker. -Maternal grandfather had throat cancer.  2 maternal great aunts died of metastatic cancer.    PLAN:  1.  Iron deficiency anemia: -I have repeated CBC in the office today.  Hemoglobin is 11.6 and ferritin is 4.  Folic acid is normal.  LDH was normal with normal reticulocyte  count. -Recommended parenteral iron therapy.  Discussed side effects in detail. -Patient has multiple allergies to environmental agents.  She is getting allergy shots. -Will premedicate with Solu-Medrol. -RTC 3 months with labs.  All questions were answered. The patient knows to call the clinic with any problems, questions or concerns.   Derek Jack, MD 02/14/20 2:28 PM  [place] [phone]   I, Milinda Antis, am acting as a scribe for Dr. Sanda Linger.  I, Derek Jack MD, have reviewed the above documentation for accuracy and completeness, and I agree with the above.

## 2020-02-14 NOTE — Patient Instructions (Addendum)
[place] at [facility] Discharge Instructions  You were seen today by Dr. Delton Coombes.  He talked with you about your medical history and the events that led to your visit here today.  He talked with you about your iron levels being low.  We will set you up for 2 iron infusions, 1 week apart.  He wants to do some additional labs today. We will see you back in 3 months.    Thank you for choosing [place] at [facility] to provide your oncology and hematology care.  To afford each patient quality time with our provider, please arrive at least 15 minutes before your scheduled appointment time.   If you have a lab appointment with the [facility] please come in thru the Main Entrance and check in at the main information desk.  You need to re-schedule your appointment should you arrive 10 or more minutes late.  We strive to give you quality time with our providers, and arriving late affects you and other patients whose appointments are after yours.  Also, if you no show three or more times for appointments you may be dismissed from the clinic at the providers discretion.     Again, thank you for choosing [facility].  Our hope is that these requests will decrease the amount of time that you wait before being seen by our physicians.       _____________________________________________________________  Should you have questions after your visit to [facility], please contact our office at [phone] and follow the prompts.  Our office hours are 8:00 a.m. and 4:30 p.m. Monday - Friday.  Please note that voicemails left after 4:00 p.m. may not be returned until the following business day.  We are closed weekends and major holidays.  You do have access to a nurse 24-7, just call the main number to the clinic [phone] and do not press any options, hold on the line and a nurse will answer the phone.    For prescription refill  requests, have your pharmacy contact our office and allow 72 hours.    Due to Covid, you will need to wear a mask upon entering the hospital. If you do not have a mask, a mask will be given to you at the Main Entrance upon arrival. For doctor visits, patients may have 1 support person age 22 or older with them. For treatment visits, patients can not have anyone with them due to social distancing guidelines and our immunocompromised population.

## 2020-02-15 DIAGNOSIS — J3089 Other allergic rhinitis: Secondary | ICD-10-CM | POA: Diagnosis not present

## 2020-02-15 DIAGNOSIS — H1045 Other chronic allergic conjunctivitis: Secondary | ICD-10-CM | POA: Diagnosis not present

## 2020-02-15 DIAGNOSIS — J301 Allergic rhinitis due to pollen: Secondary | ICD-10-CM | POA: Diagnosis not present

## 2020-02-15 DIAGNOSIS — J3081 Allergic rhinitis due to animal (cat) (dog) hair and dander: Secondary | ICD-10-CM | POA: Diagnosis not present

## 2020-02-18 LAB — COPPER, SERUM: Copper: 134 ug/dL (ref 80–158)

## 2020-02-20 ENCOUNTER — Other Ambulatory Visit: Payer: Self-pay

## 2020-02-20 ENCOUNTER — Encounter (HOSPITAL_COMMUNITY): Payer: Self-pay

## 2020-02-20 ENCOUNTER — Inpatient Hospital Stay (HOSPITAL_COMMUNITY): Payer: BC Managed Care – PPO | Attending: Hematology

## 2020-02-20 VITALS — BP 98/62 | HR 53 | Temp 97.1°F | Resp 17

## 2020-02-20 DIAGNOSIS — D500 Iron deficiency anemia secondary to blood loss (chronic): Secondary | ICD-10-CM

## 2020-02-20 DIAGNOSIS — D509 Iron deficiency anemia, unspecified: Secondary | ICD-10-CM | POA: Insufficient documentation

## 2020-02-20 LAB — METHYLMALONIC ACID, SERUM: Methylmalonic Acid, Quantitative: 128 nmol/L (ref 0–378)

## 2020-02-20 MED ORDER — METHYLPREDNISOLONE SODIUM SUCC 125 MG IJ SOLR
INTRAMUSCULAR | Status: AC
  Filled 2020-02-20: qty 2

## 2020-02-20 MED ORDER — METHYLPREDNISOLONE SODIUM SUCC 125 MG IJ SOLR
125.0000 mg | Freq: Once | INTRAMUSCULAR | Status: AC
  Administered 2020-02-20: 125 mg via INTRAVENOUS

## 2020-02-20 MED ORDER — SODIUM CHLORIDE 0.9 % IV SOLN
510.0000 mg | Freq: Once | INTRAVENOUS | Status: AC
  Administered 2020-02-20: 510 mg via INTRAVENOUS
  Filled 2020-02-20: qty 510

## 2020-02-20 MED ORDER — SODIUM CHLORIDE 0.9 % IV SOLN: Freq: Once | INTRAVENOUS | Status: AC

## 2020-02-20 NOTE — Patient Instructions (Signed)
[facility]  Discharge Instructions:   _______________________________________________________________  Thank you for choosing [facility] at [facility] to provide your oncology and hematology care.  To afford each patient quality time with our providers, please arrive at least 15 minutes before your scheduled appointment.  You need to re-schedule your appointment if you arrive 10 or more minutes late.  We strive to give you quality time with our providers, and arriving late affects you and other patients whose appointments are after yours.  Also, if you no show three or more times for appointments you may be dismissed from the clinic.  Again, thank you for choosing [facility] at [facility]. Our hope is that these requests will allow you access to exceptional care and in a timely manner. _______________________________________________________________  If you have questions after your visit, please contact our office at [phone] between the hours of 8:30 a.m. and 5:00 p.m. Voicemails left after 4:30 p.m. will not be returned until the following business day. _______________________________________________________________  For prescription refill requests, have your pharmacy contact our office. _______________________________________________________________  Recommendations made by the consultant and any test results will be sent to your referring physician. _______________________________________________________________ 

## 2020-02-20 NOTE — Progress Notes (Signed)
Feraheme given today per MD orders. Tolerated infusion without adverse affects. Vital signs stable. No complaints at this time. Discharged from clinic ambulatory in stable condition. Alert and oriented x 3. F/U with [facility] as scheduled.

## 2020-02-21 DIAGNOSIS — J3081 Allergic rhinitis due to animal (cat) (dog) hair and dander: Secondary | ICD-10-CM | POA: Diagnosis not present

## 2020-02-21 DIAGNOSIS — J3089 Other allergic rhinitis: Secondary | ICD-10-CM | POA: Diagnosis not present

## 2020-02-21 DIAGNOSIS — J301 Allergic rhinitis due to pollen: Secondary | ICD-10-CM | POA: Diagnosis not present

## 2020-02-27 ENCOUNTER — Encounter (HOSPITAL_COMMUNITY): Payer: Self-pay

## 2020-02-27 ENCOUNTER — Other Ambulatory Visit: Payer: Self-pay

## 2020-02-27 ENCOUNTER — Inpatient Hospital Stay (HOSPITAL_COMMUNITY): Payer: BC Managed Care – PPO

## 2020-02-27 VITALS — BP 98/66 | HR 62 | Temp 97.1°F | Resp 17

## 2020-02-27 DIAGNOSIS — D500 Iron deficiency anemia secondary to blood loss (chronic): Secondary | ICD-10-CM

## 2020-02-27 DIAGNOSIS — D509 Iron deficiency anemia, unspecified: Secondary | ICD-10-CM | POA: Diagnosis not present

## 2020-02-27 MED ORDER — SODIUM CHLORIDE 0.9 % IV SOLN
510.0000 mg | Freq: Once | INTRAVENOUS | Status: AC
  Administered 2020-02-27: 510 mg via INTRAVENOUS
  Filled 2020-02-27: qty 510

## 2020-02-27 MED ORDER — METHYLPREDNISOLONE SODIUM SUCC 125 MG IJ SOLR
125.0000 mg | Freq: Once | INTRAMUSCULAR | Status: AC
  Administered 2020-02-27: 125 mg via INTRAVENOUS

## 2020-02-27 MED ORDER — SODIUM CHLORIDE 0.9 % IV SOLN: Freq: Once | INTRAVENOUS | Status: AC

## 2020-02-27 MED ORDER — METHYLPREDNISOLONE SODIUM SUCC 125 MG IJ SOLR
INTRAMUSCULAR | Status: AC
  Filled 2020-02-27: qty 2

## 2020-02-27 NOTE — Patient Instructions (Signed)
[facility]  Discharge Instructions:   _______________________________________________________________  Thank you for choosing [facility] at [facility] to provide your oncology and hematology care.  To afford each patient quality time with our providers, please arrive at least 15 minutes before your scheduled appointment.  You need to re-schedule your appointment if you arrive 10 or more minutes late.  We strive to give you quality time with our providers, and arriving late affects you and other patients whose appointments are after yours.  Also, if you no show three or more times for appointments you may be dismissed from the clinic.  Again, thank you for choosing [facility] at [facility]. Our hope is that these requests will allow you access to exceptional care and in a timely manner. _______________________________________________________________  If you have questions after your visit, please contact our office at [phone] between the hours of 8:30 a.m. and 5:00 p.m. Voicemails left after 4:30 p.m. will not be returned until the following business day. _______________________________________________________________  For prescription refill requests, have your pharmacy contact our office. _______________________________________________________________  Recommendations made by the consultant and any test results will be sent to your referring physician. _______________________________________________________________ 

## 2020-02-27 NOTE — Progress Notes (Signed)
Patient presents today for Feraheme infusion. Vital signs stable. Patient has no complaints of any changes since her last iron. Patient denies pain today.   Feraheme given today per MD orders. Tolerated infusion without adverse affects. Vital signs stable. No complaints at this time. Discharged from clinic ambulatory in stable condition. Alert and oriented x 3. F/U with [facility] as scheduled.

## 2020-02-29 DIAGNOSIS — J301 Allergic rhinitis due to pollen: Secondary | ICD-10-CM | POA: Diagnosis not present

## 2020-02-29 DIAGNOSIS — J3089 Other allergic rhinitis: Secondary | ICD-10-CM | POA: Diagnosis not present

## 2020-02-29 DIAGNOSIS — J3081 Allergic rhinitis due to animal (cat) (dog) hair and dander: Secondary | ICD-10-CM | POA: Diagnosis not present

## 2020-03-05 ENCOUNTER — Other Ambulatory Visit: Payer: Self-pay | Admitting: Family Medicine

## 2020-03-05 DIAGNOSIS — F419 Anxiety disorder, unspecified: Secondary | ICD-10-CM

## 2020-03-08 DIAGNOSIS — J301 Allergic rhinitis due to pollen: Secondary | ICD-10-CM | POA: Diagnosis not present

## 2020-03-08 DIAGNOSIS — J3081 Allergic rhinitis due to animal (cat) (dog) hair and dander: Secondary | ICD-10-CM | POA: Diagnosis not present

## 2020-03-08 DIAGNOSIS — J3089 Other allergic rhinitis: Secondary | ICD-10-CM | POA: Diagnosis not present

## 2020-03-14 DIAGNOSIS — J301 Allergic rhinitis due to pollen: Secondary | ICD-10-CM | POA: Diagnosis not present

## 2020-03-14 DIAGNOSIS — J3089 Other allergic rhinitis: Secondary | ICD-10-CM | POA: Diagnosis not present

## 2020-03-14 DIAGNOSIS — J3081 Allergic rhinitis due to animal (cat) (dog) hair and dander: Secondary | ICD-10-CM | POA: Diagnosis not present

## 2020-03-16 ENCOUNTER — Other Ambulatory Visit: Payer: Self-pay | Admitting: Family Medicine

## 2020-03-27 DIAGNOSIS — J301 Allergic rhinitis due to pollen: Secondary | ICD-10-CM | POA: Diagnosis not present

## 2020-03-27 DIAGNOSIS — J3089 Other allergic rhinitis: Secondary | ICD-10-CM | POA: Diagnosis not present

## 2020-03-27 DIAGNOSIS — J3081 Allergic rhinitis due to animal (cat) (dog) hair and dander: Secondary | ICD-10-CM | POA: Diagnosis not present

## 2020-04-02 DIAGNOSIS — J301 Allergic rhinitis due to pollen: Secondary | ICD-10-CM | POA: Diagnosis not present

## 2020-04-02 DIAGNOSIS — J3081 Allergic rhinitis due to animal (cat) (dog) hair and dander: Secondary | ICD-10-CM | POA: Diagnosis not present

## 2020-04-02 DIAGNOSIS — J3089 Other allergic rhinitis: Secondary | ICD-10-CM | POA: Diagnosis not present

## 2020-04-10 DIAGNOSIS — J3089 Other allergic rhinitis: Secondary | ICD-10-CM | POA: Diagnosis not present

## 2020-04-10 DIAGNOSIS — J301 Allergic rhinitis due to pollen: Secondary | ICD-10-CM | POA: Diagnosis not present

## 2020-04-10 DIAGNOSIS — J3081 Allergic rhinitis due to animal (cat) (dog) hair and dander: Secondary | ICD-10-CM | POA: Diagnosis not present

## 2020-04-11 ENCOUNTER — Ambulatory Visit
Admission: EM | Admit: 2020-04-11 | Discharge: 2020-04-11 | Disposition: A | Discharge status: Home / Self Care | Payer: BC Managed Care – PPO | Attending: Emergency Medicine | Admitting: Emergency Medicine

## 2020-04-11 ENCOUNTER — Other Ambulatory Visit: Payer: Self-pay

## 2020-04-11 DIAGNOSIS — J069 Acute upper respiratory infection, unspecified: Secondary | ICD-10-CM | POA: Diagnosis not present

## 2020-04-11 DIAGNOSIS — Z1152 Encounter for screening for COVID-19: Secondary | ICD-10-CM | POA: Diagnosis not present

## 2020-04-11 MED ORDER — CETIRIZINE-PSEUDOEPHEDRINE ER 5-120 MG PO TB12: 1.0000 | ORAL_TABLET | Freq: Every day | ORAL | 0 refills | Status: DC

## 2020-04-11 MED ORDER — PREDNISONE 10 MG PO TABS: 20.0000 mg | ORAL_TABLET | Freq: Every day | ORAL | 0 refills | Status: DC

## 2020-04-11 NOTE — ED Provider Notes (Signed)
Sonia [name]   [id number] 04/11/20 Arrival Time: [place]   Chief Complaint  Patient presents with  . Nasal Congestion  . Cough     SUBJECTIVE: History from: patient.  Mayvis Kliewer is a 34 y.o. female who presented to the urgent care with a complaint of cough, nasal congestion and sore throat that started this past Sunday.  Denies sick exposure to COVID, flu or strep.  Denies recent travel.  Has tried OTC medication relief.  Denies aggravating factors.  Denies previous symptoms in the past.   Denies fever, chills, fatigue, sinus pain, rhinorrhea, sore throat, SOB, wheezing, chest pain, nausea, changes in bowel or bladder habits.     ROS: As per HPI.  All other pertinent ROS negative.      Past Medical History:  Diagnosis Date  . COVID-19   . Crohn's colitis ([place])   . Depression   . GERD (gastroesophageal reflux disease)    no meds  . Normal labor 03/27/2013  . Postpartum care following cesarean delivery (11/9) 03/27/2013   Past Surgical History:  Procedure Laterality Date  . CESAREAN SECTION N/A 03/27/2013   Procedure: CESAREAN SECTION;  Surgeon: Lovenia Kim, MD;  Location: [facility];  Service: Obstetrics;  Laterality: N/A;  . CESAREAN SECTION N/A 09/04/2014   Procedure: REPEAT CESAREAN SECTION;  Surgeon: Crawford Givens, MD;  Location: [facility];  Service: Obstetrics;  Laterality: N/A;  . HAND SURGERY  2012   with nerve block  . LAPAROSCOPIC ENDOMETRIOSIS FULGURATION    . WISDOM TOOTH EXTRACTION     Allergies  Allergen Reactions  . Other Other (See Comments)    Mold, mildew, grass, fungus, trees cause swelling of eyes   No current facility-administered medications on file prior to encounter.   Current Outpatient Medications on File Prior to Encounter  Medication Sig Dispense Refill  . albuterol (VENTOLIN HFA) 108 (90 Base) MCG/ACT inhaler Inhale 1-2 puffs into the lungs every 4 (four) hours as needed for wheezing or shortness of breath. 18 g 0  .  budesonide-formoterol (SYMBICORT) 80-4.5 MCG/ACT inhaler Inhale 2 puffs into the lungs 2 (two) times a day.    Marland Kitchen buPROPion (WELLBUTRIN SR) 150 MG 12 hr tablet TAKE 1 TABLET BY MOUTH TWICE A DAY 180 tablet 0  . cetirizine (ZYRTEC) 10 MG tablet Take 10 mg by mouth daily.    . diphenhydrAMINE (BENADRYL) 25 mg capsule Take 25 mg by mouth at bedtime as needed for allergies. For itching    . escitalopram (LEXAPRO) 10 MG tablet TAKE 1 TABLET (10 MG TOTAL) BY MOUTH IN THE MORNING AND AT BEDTIME. 180 tablet 0  . hydrocortisone 2.5 % cream Apply 1 application topically daily as needed (for itching-irritation).     . montelukast (SINGULAIR) 10 MG tablet Take 10 mg by mouth at bedtime.    Marland Kitchen ofloxacin (OCUFLOX) 0.3 % ophthalmic solution Place 1 drop into the right eye 4 (four) times daily. 5 mL 0  . Olopatadine HCl 0.2 % SOLN Apply 1 drop to eye daily as needed. 2.5 mL 0  . traZODone (DESYREL) 50 MG tablet Take 1 tablet (50 mg total) by mouth at bedtime. 90 tablet 1  . [DISCONTINUED] dicyclomine (BENTYL) 10 MG capsule Take 1 capsule (10 mg total) by mouth 3 (three) times daily before meals. 90 capsule 2  . [DISCONTINUED] Norgestimate-Eth Estradiol (SPRINTEC 28 PO) Take by mouth.     Social History   Socioeconomic History  . Marital status: Married    Spouse name:  Not on file  . Number of children: 2  . Years of education: Not on file  . Highest education level: Not on file  Occupational History  . Occupation: stay at home mother  Tobacco Use  . Smoking status: Never Smoker  . Smokeless tobacco: Former Systems developer    Types: Secondary school teacher  . Vaping Use: Never used  Substance and Sexual Activity  . Alcohol use: Yes    Comment: occasionally  . Drug use: No  . Sexual activity: Yes    Birth control/protection: None  Other Topics Concern  . Not on file  Social History Narrative  . Not on file   Social Determinants of Health   Financial Resource Strain:   . Difficulty of Paying Living Expenses: Not  on file  Food Insecurity:   . Worried About Charity fundraiser in the Last Year: Not on file  . Ran Out of Food in the Last Year: Not on file  Transportation Needs:   . Lack of Transportation (Medical): Not on file  . Lack of Transportation (Non-Medical): Not on file  Physical Activity:   . Days of Exercise per Week: Not on file  . Minutes of Exercise per Session: Not on file  Stress:   . Feeling of Stress : Not on file  Social Connections:   . Frequency of Communication with Friends and Family: Not on file  . Frequency of Social Gatherings with Friends and Family: Not on file  . Attends Religious Services: Not on file  . Active Member of Clubs or Organizations: Not on file  . Attends Archivist Meetings: Not on file  . Marital Status: Not on file  Intimate Partner Violence:   . Fear of Current or Ex-Partner: Not on file  . Emotionally Abused: Not on file  . Physically Abused: Not on file  . Sexually Abused: Not on file   Family History  Problem Relation Age of Onset  . COPD Mother   . Heart disease Mother        smoker  . Hernia Mother   . Diverticulitis Mother   . Kidney disease Mother   . Heart failure Mother   . Stroke Mother 44  . Osteoporosis Mother   . Anxiety disorder Mother        ? on xanax  . Rheum arthritis Mother   . Heart disease Maternal Grandmother        smoker  . Arthritis-Osteo Maternal Grandmother   . Cancer Maternal Grandfather        throat  . Alcohol abuse Maternal Grandfather   . Other Father        pituitary gland tumor  . Depression Sister   . Anxiety disorder Sister   . Colon cancer Neg Hx     OBJECTIVE:  Vitals:   04/11/20 1512  BP: 93/64  Pulse: 69  Resp: 18  Temp: 98.2 F (36.8 C)  SpO2: 98%     General appearance: alert; appears fatigued, but nontoxic; speaking in full sentences and tolerating own secretions HEENT: NCAT; Ears: EACs clear, TMs pearly gray; Eyes: PERRL.  EOM grossly intact. Sinuses: nontender;  Nose: nares patent without rhinorrhea, Throat: oropharynx clear, tonsils non erythematous or enlarged, uvula midline  Neck: supple without LAD Lungs: unlabored respirations, symmetrical air entry; cough: mild; no respiratory distress; CTAB Heart: regular rate and rhythm.  Radial pulses 2+ symmetrical bilaterally Skin: warm and dry Psychological: alert and cooperative; normal mood and affect  LABS:  No results found for this or any previous visit (from the past 24 hour(s)).   ASSESSMENT & PLAN:  1. Encounter for screening for COVID-19   2. URI with cough and congestion     Meds ordered this encounter  Medications  . cetirizine-pseudoephedrine (ZYRTEC-D) 5-120 MG tablet    Sig: Take 1 tablet by mouth daily.    Dispense:  30 tablet    Refill:  0  . predniSONE (DELTASONE) 10 MG tablet    Sig: Take 2 tablets (20 mg total) by mouth daily.    Dispense:  15 tablet    Refill:  0    Discharge Instrcutions   COVID-19, RSV, flu A/B testing ordered.  It will take between 2-7 days for test results.  Someone will contact you regarding abnormal results.    In the meantime: You should remain isolated in your home for 10 days from symptom onset AND greater than 24 hours after symptoms resolution (absence of fever without the use of fever-reducing medication and improvement in respiratory symptoms), whichever is longer Get plenty of rest and push fluids Continue Tessalon Perles prescribed for cough Zyrtec-D was prescribed Prednisone was prescribed/use as directed Use medications daily for symptom relief Use OTC medications like ibuprofen or tylenol as needed fever or pain Call or go to the ED if you have any new or worsening symptoms such as fever, worsening cough, shortness of breath, chest tightness, chest pain, turning blue, changes in mental status, etc...   Reviewed expectations re: course of current medical issues. Questions answered. Outlined signs and symptoms indicating need for  more acute intervention. Patient verbalized understanding. After Visit Summary given.         Emerson Monte, [place] 04/11/20 1533

## 2020-04-11 NOTE — Discharge Instructions (Addendum)
COVID-19, RSV, flu A/B testing ordered.  It will take between 2-7 days for test results.  Someone will contact you regarding abnormal results.    In the meantime: You should remain isolated in your home for 10 days from symptom onset AND greater than 24 hours after symptoms resolution (absence of fever without the use of fever-reducing medication and improvement in respiratory symptoms), whichever is longer Get plenty of rest and push fluids Continue Tessalon Perles prescribed for cough Zyrtec-D was prescribed Prednisone was prescribed/use as directed Use medications daily for symptom relief Use OTC medications like ibuprofen or tylenol as needed fever or pain Call or go to the ED if you have any new or worsening symptoms such as fever, worsening cough, shortness of breath, chest tightness, chest pain, turning blue, changes in mental status, etc..Marland Kitchen

## 2020-04-11 NOTE — ED Triage Notes (Signed)
Pt states that cough and sore throat began on Sunday, symptoms getting worse, wants flu test

## 2020-04-13 LAB — COVID-19, FLU A+B AND RSV
Influenza A, NAA: NOT DETECTED
Influenza B, NAA: NOT DETECTED
RSV, NAA: NOT DETECTED
SARS-CoV-2, NAA: NOT DETECTED

## 2020-04-16 DIAGNOSIS — J3089 Other allergic rhinitis: Secondary | ICD-10-CM | POA: Diagnosis not present

## 2020-04-16 DIAGNOSIS — J301 Allergic rhinitis due to pollen: Secondary | ICD-10-CM | POA: Diagnosis not present

## 2020-04-16 DIAGNOSIS — J3081 Allergic rhinitis due to animal (cat) (dog) hair and dander: Secondary | ICD-10-CM | POA: Diagnosis not present

## 2020-04-24 DIAGNOSIS — J301 Allergic rhinitis due to pollen: Secondary | ICD-10-CM | POA: Diagnosis not present

## 2020-04-24 DIAGNOSIS — J3081 Allergic rhinitis due to animal (cat) (dog) hair and dander: Secondary | ICD-10-CM | POA: Diagnosis not present

## 2020-04-24 DIAGNOSIS — J3089 Other allergic rhinitis: Secondary | ICD-10-CM | POA: Diagnosis not present

## 2020-04-30 DIAGNOSIS — J301 Allergic rhinitis due to pollen: Secondary | ICD-10-CM | POA: Diagnosis not present

## 2020-04-30 DIAGNOSIS — J3089 Other allergic rhinitis: Secondary | ICD-10-CM | POA: Diagnosis not present

## 2020-04-30 DIAGNOSIS — J3081 Allergic rhinitis due to animal (cat) (dog) hair and dander: Secondary | ICD-10-CM | POA: Diagnosis not present

## 2020-05-08 ENCOUNTER — Encounter: Payer: Self-pay | Admitting: Nurse Practitioner

## 2020-05-09 ENCOUNTER — Other Ambulatory Visit: Payer: Self-pay

## 2020-05-09 ENCOUNTER — Inpatient Hospital Stay (HOSPITAL_COMMUNITY): Payer: BC Managed Care – PPO | Attending: Medical

## 2020-05-09 DIAGNOSIS — D500 Iron deficiency anemia secondary to blood loss (chronic): Secondary | ICD-10-CM

## 2020-05-09 DIAGNOSIS — D509 Iron deficiency anemia, unspecified: Secondary | ICD-10-CM | POA: Insufficient documentation

## 2020-05-09 LAB — CBC WITH DIFFERENTIAL/PLATELET
Abs Immature Granulocytes: 0.01 10*3/uL (ref 0.00–0.07)
Basophils Absolute: 0.1 10*3/uL (ref 0.0–0.1)
Basophils Relative: 1 %
Eosinophils Absolute: 0.2 10*3/uL (ref 0.0–0.5)
Eosinophils Relative: 3 %
HCT: 37.7 % (ref 36.0–46.0)
Hemoglobin: 12 g/dL (ref 12.0–15.0)
Immature Granulocytes: 0 %
Lymphocytes Relative: 20 %
Lymphs Abs: 1.2 10*3/uL (ref 0.7–4.0)
MCH: 29.3 pg (ref 26.0–34.0)
MCHC: 31.8 g/dL (ref 30.0–36.0)
MCV: 92 fL (ref 80.0–100.0)
Monocytes Absolute: 0.5 10*3/uL (ref 0.1–1.0)
Monocytes Relative: 8 %
Neutro Abs: 4 10*3/uL (ref 1.7–7.7)
Neutrophils Relative %: 68 %
Platelets: 296 10*3/uL (ref 150–400)
RBC: 4.1 MIL/uL (ref 3.87–5.11)
RDW: 14.6 % (ref 11.5–15.5)
WBC: 5.9 10*3/uL (ref 4.0–10.5)
nRBC: 0 % (ref 0.0–0.2)

## 2020-05-09 LAB — IRON AND TIBC
Iron: 85 ug/dL (ref 28–170)
Saturation Ratios: 30 % (ref 10.4–31.8)
TIBC: 279 ug/dL (ref 250–450)
UIBC: 194 ug/dL

## 2020-05-09 LAB — FERRITIN: Ferritin: 81 ng/mL (ref 11–307)

## 2020-05-10 DIAGNOSIS — J301 Allergic rhinitis due to pollen: Secondary | ICD-10-CM | POA: Diagnosis not present

## 2020-05-10 DIAGNOSIS — J3089 Other allergic rhinitis: Secondary | ICD-10-CM | POA: Diagnosis not present

## 2020-05-10 DIAGNOSIS — J3081 Allergic rhinitis due to animal (cat) (dog) hair and dander: Secondary | ICD-10-CM | POA: Diagnosis not present

## 2020-05-14 ENCOUNTER — Emergency Department (HOSPITAL_BASED_OUTPATIENT_CLINIC_OR_DEPARTMENT_OTHER)
Admission: EM | Admit: 2020-05-14 | Discharge: 2020-05-15 | Disposition: A | Discharge status: Home / Self Care | Payer: BC Managed Care – PPO | Source: Home / Self Care | Attending: Emergency Medicine | Admitting: Emergency Medicine

## 2020-05-14 ENCOUNTER — Emergency Department (HOSPITAL_COMMUNITY)
Admission: EM | Admit: 2020-05-14 | Discharge: 2020-05-14 | Disposition: A | Discharge status: Home / Self Care | Payer: BC Managed Care – PPO | Attending: Emergency Medicine | Admitting: Emergency Medicine

## 2020-05-14 ENCOUNTER — Other Ambulatory Visit: Payer: Self-pay

## 2020-05-14 ENCOUNTER — Encounter (HOSPITAL_BASED_OUTPATIENT_CLINIC_OR_DEPARTMENT_OTHER): Payer: Self-pay | Admitting: *Deleted

## 2020-05-14 DIAGNOSIS — R109 Unspecified abdominal pain: Secondary | ICD-10-CM | POA: Diagnosis not present

## 2020-05-14 DIAGNOSIS — Z7951 Long term (current) use of inhaled steroids: Secondary | ICD-10-CM | POA: Insufficient documentation

## 2020-05-14 DIAGNOSIS — J4540 Moderate persistent asthma, uncomplicated: Secondary | ICD-10-CM | POA: Insufficient documentation

## 2020-05-14 DIAGNOSIS — Z87891 Personal history of nicotine dependence: Secondary | ICD-10-CM | POA: Insufficient documentation

## 2020-05-14 DIAGNOSIS — R9431 Abnormal electrocardiogram [ECG] [EKG]: Secondary | ICD-10-CM | POA: Diagnosis not present

## 2020-05-14 DIAGNOSIS — K859 Acute pancreatitis without necrosis or infection, unspecified: Secondary | ICD-10-CM | POA: Insufficient documentation

## 2020-05-14 DIAGNOSIS — Z5321 Procedure and treatment not carried out due to patient leaving prior to being seen by health care provider: Secondary | ICD-10-CM | POA: Insufficient documentation

## 2020-05-14 LAB — COMPREHENSIVE METABOLIC PANEL
ALT: 23 U/L (ref 0–44)
AST: 21 U/L (ref 15–41)
Albumin: 4.4 g/dL (ref 3.5–5.0)
Alkaline Phosphatase: 28 U/L — ABNORMAL LOW (ref 38–126)
Anion gap: 10 (ref 5–15)
BUN: 5 mg/dL — ABNORMAL LOW (ref 6–20)
CO2: 26 mmol/L (ref 22–32)
Calcium: 8.9 mg/dL (ref 8.9–10.3)
Chloride: 99 mmol/L (ref 98–111)
Creatinine, Ser: 0.71 mg/dL (ref 0.44–1.00)
GFR, Estimated: >60 mL/min (ref >60)
Glucose, Bld: 106 mg/dL — ABNORMAL HIGH (ref 70–99)
Potassium: 3.9 mmol/L (ref 3.5–5.1)
Sodium: 135 mmol/L (ref 135–145)
Total Bilirubin: 0.2 mg/dL — ABNORMAL LOW (ref 0.3–1.2)
Total Protein: 7 g/dL (ref 6.5–8.1)

## 2020-05-14 LAB — URINALYSIS, ROUTINE W REFLEX MICROSCOPIC
Bilirubin Urine: NEGATIVE
Glucose, UA: NEGATIVE mg/dL
Hgb urine dipstick: NEGATIVE
Ketones, ur: NEGATIVE mg/dL
Leukocytes,Ua: NEGATIVE
Nitrite: NEGATIVE
Protein, ur: NEGATIVE mg/dL
Specific Gravity, Urine: 1.01 (ref 1.005–1.030)
pH: 6.5 (ref 5.0–8.0)

## 2020-05-14 LAB — CBC
HCT: 39.6 % (ref 36.0–46.0)
Hemoglobin: 12.9 g/dL (ref 12.0–15.0)
MCH: 29.3 pg (ref 26.0–34.0)
MCHC: 32.6 g/dL (ref 30.0–36.0)
MCV: 90 fL (ref 80.0–100.0)
Platelets: 337 10*3/uL (ref 150–400)
RBC: 4.4 MIL/uL (ref 3.87–5.11)
RDW: 14.3 % (ref 11.5–15.5)
WBC: 7.9 10*3/uL (ref 4.0–10.5)
nRBC: 0 % (ref 0.0–0.2)

## 2020-05-14 LAB — LIPASE, BLOOD: Lipase: 215 U/L — ABNORMAL HIGH (ref 11–51)

## 2020-05-14 LAB — PREGNANCY, URINE: Preg Test, Ur: NEGATIVE

## 2020-05-14 NOTE — ED Triage Notes (Signed)
Severe abdominal pain x 2 weeks. Pain radiates into her back.

## 2020-05-14 NOTE — ED Triage Notes (Signed)
Pt c/o abdominal pain that radiates to her back.  Pt states she has had the pain for a month.

## 2020-05-15 ENCOUNTER — Emergency Department (HOSPITAL_BASED_OUTPATIENT_CLINIC_OR_DEPARTMENT_OTHER): Payer: BC Managed Care – PPO

## 2020-05-15 ENCOUNTER — Telehealth: Payer: Self-pay

## 2020-05-15 ENCOUNTER — Encounter: Payer: Self-pay | Admitting: Family Medicine

## 2020-05-15 ENCOUNTER — Encounter (HOSPITAL_BASED_OUTPATIENT_CLINIC_OR_DEPARTMENT_OTHER): Payer: Self-pay | Admitting: Radiology

## 2020-05-15 DIAGNOSIS — R109 Unspecified abdominal pain: Secondary | ICD-10-CM | POA: Diagnosis not present

## 2020-05-15 MED ORDER — IOHEXOL 300 MG/ML  SOLN
100.0000 mL | Freq: Once | INTRAMUSCULAR | Status: AC | PRN
  Administered 2020-05-15: 100 mL via INTRAVENOUS

## 2020-05-15 MED ORDER — ONDANSETRON 4 MG PO TBDP: 4.0000 mg | ORAL_TABLET | Freq: Three times a day (TID) | ORAL | 0 refills | Status: DC | PRN

## 2020-05-15 MED ORDER — HYDROCODONE-ACETAMINOPHEN 5-325 MG PO TABS
1.0000 | ORAL_TABLET | Freq: Four times a day (QID) | ORAL | 0 refills | Status: DC | PRN
Start: 2020-05-15 — End: 2020-05-24

## 2020-05-15 NOTE — ED Provider Notes (Signed)
[facility] EMERGENCY DEPARTMENT Provider Note   CSN: [id number] Arrival date & time: 05/14/20  1832     History Chief Complaint  Patient presents with  . Abdominal Pain    Sonia [name] is a 34 y.o. female.  HPI Patient with upper abdominal pain.  Radiates to back.  Has had somewhat for the last month but worse over the last 2 weeks.  Some nausea and vomiting.  Pain worse after eating.  History of Crohn's colitis.  Has follow-up with her gastroenterologist in almost 3 weeks.  No fevers.  Pain is dull.  States she did have a small amount of blood in the stool.  States it was however not with wiping.  Denies pregnancy.  Denies heavy alcohol use but says she does drink some.  No history of gallstones.    Past Medical History:  Diagnosis Date  . COVID-19   . Crohn's colitis ([place])   . Depression   . GERD (gastroesophageal reflux disease)    no meds  . Normal labor 03/27/2013  . Postpartum care following cesarean delivery (11/9) 03/27/2013    Patient Active Problem List   Diagnosis Date Noted  . Iron deficiency anemia due to chronic blood loss 02/14/2020  . Moderate persistent asthma 09/06/2019  . Insomnia 09/29/2018  . Bulimia nervosa, purging type 09/15/2018  . Moderate major depression ([place]) 09/15/2018  . Crohn's disease of colon without complication ([place]) 88/03/314  . LUQ abdominal pain 03/01/2018  . Crohn's disease ([place]) 08/30/2014  . Headache disorder 08/30/2014  . Anxiety 06/18/2009  . GERD 05/23/2009    Past Surgical History:  Procedure Laterality Date  . CESAREAN SECTION N/A 03/27/2013   Procedure: CESAREAN SECTION;  Surgeon: Lovenia Kim, MD;  Location: [facility];  Service: Obstetrics;  Laterality: N/A;  . CESAREAN SECTION N/A 09/04/2014   Procedure: REPEAT CESAREAN SECTION;  Surgeon: Crawford Givens, MD;  Location: [facility];  Service: Obstetrics;  Laterality: N/A;  . HAND SURGERY  2012   with nerve block  . LAPAROSCOPIC ENDOMETRIOSIS FULGURATION     . WISDOM TOOTH EXTRACTION       OB History    Gravida  2   Para  2   Term  2   Preterm  0   AB  0   Living  2     SAB  0   IAB  0   Ectopic  0   Multiple  0   Live Births  2           Family History  Problem Relation Age of Onset  . COPD Mother   . Heart disease Mother        smoker  . Hernia Mother   . Diverticulitis Mother   . Kidney disease Mother   . Heart failure Mother   . Stroke Mother 54  . Osteoporosis Mother   . Anxiety disorder Mother        ? on xanax  . Rheum arthritis Mother   . Heart disease Maternal Grandmother        smoker  . Arthritis-Osteo Maternal Grandmother   . Cancer Maternal Grandfather        throat  . Alcohol abuse Maternal Grandfather   . Other Father        pituitary gland tumor  . Depression Sister   . Anxiety disorder Sister   . Colon cancer Neg Hx     Social History   Tobacco Use  . Smoking status:  Never Smoker  . Smokeless tobacco: Former Systems developer    Types: Secondary school teacher  . Vaping Use: Never used  Substance Use Topics  . Alcohol use: Yes    Comment: occasionally  . Drug use: No    Home Medications Prior to Admission medications   Medication Sig Start Date End Date Taking? Authorizing Provider  HYDROcodone-acetaminophen (NORCO/VICODIN) 5-325 MG tablet Take 1-2 tablets by mouth every 6 (six) hours as needed. 05/15/20  Yes Davonna Belling, MD  ondansetron (ZOFRAN-ODT) 4 MG disintegrating tablet Take 1 tablet (4 mg total) by mouth every 8 (eight) hours as needed for nausea or vomiting. 05/15/20  Yes Davonna Belling, MD  albuterol (VENTOLIN HFA) 108 (90 Base) MCG/ACT inhaler Inhale 1-2 puffs into the lungs every 4 (four) hours as needed for wheezing or shortness of breath. 08/26/19   Jaynee Eagles, PA-C  budesonide-formoterol (SYMBICORT) 80-4.5 MCG/ACT inhaler Inhale 2 puffs into the lungs 2 (two) times a day.    [provider]  buPROPion (WELLBUTRIN SR) 150 MG 12 hr tablet TAKE 1 TABLET BY MOUTH  TWICE A DAY 03/16/20   Koberlein, Andris Flurry C, MD  cetirizine (ZYRTEC) 10 MG tablet Take 10 mg by mouth daily.    [provider]  cetirizine-pseudoephedrine (ZYRTEC-D) 5-120 MG tablet Take 1 tablet by mouth daily. 04/11/20   Avegno, Darrelyn Hillock, FNP  diphenhydrAMINE (BENADRYL) 25 mg capsule Take 25 mg by mouth at bedtime as needed for allergies. For itching    [provider]  escitalopram (LEXAPRO) 10 MG tablet TAKE 1 TABLET (10 MG TOTAL) BY MOUTH IN THE MORNING AND AT BEDTIME. 03/06/20   Koberlein, Junell C, MD  hydrocortisone 2.5 % cream Apply 1 application topically daily as needed (for itching-irritation).  12/05/19   [provider]  montelukast (SINGULAIR) 10 MG tablet Take 10 mg by mouth at bedtime.    [provider]  ofloxacin (OCUFLOX) 0.3 % ophthalmic solution Place 1 drop into the right eye 4 (four) times daily. 12/26/19   Avegno, Darrelyn Hillock, FNP  Olopatadine HCl 0.2 % SOLN Apply 1 drop to eye daily as needed. 12/26/19   Avegno, Darrelyn Hillock, FNP  predniSONE (DELTASONE) 10 MG tablet Take 2 tablets (20 mg total) by mouth daily. 04/11/20   Avegno, Darrelyn Hillock, FNP  traZODone (DESYREL) 50 MG tablet Take 1 tablet (50 mg total) by mouth at bedtime. 12/16/19   Caren Macadam, MD  dicyclomine (BENTYL) 10 MG capsule Take 1 capsule (10 mg total) by mouth 3 (three) times daily before meals. 03/29/18 09/14/19  Zehr, Laban Emperor, PA-C  Norgestimate-Eth Estradiol (SPRINTEC 28 PO) Take by mouth.  09/14/19  [provider]    Allergies    Other  Review of Systems   Review of Systems  Constitutional: Negative for appetite change and fever.  HENT: Negative for congestion.   Respiratory: Negative for shortness of breath.   Cardiovascular: Negative for chest pain.  Gastrointestinal: Positive for abdominal pain, nausea and vomiting.  Genitourinary: Negative for flank pain.  Musculoskeletal: Negative for back pain.  Skin: Negative for pallor.  Neurological:  Negative for weakness.  Psychiatric/Behavioral: Negative for agitation.    Physical Exam Updated Vital Signs BP 127/78   Pulse 62   Temp 98 F (36.7 C)   Resp 17   Ht 5' 2"  (1.575 m)   Wt 71.4 kg   LMP 04/24/2020 (Exact Date) Comment: neg preg test  SpO2 100%   BMI 28.79 kg/m   Physical Exam Vitals  and nursing note reviewed.  HENT:     Head: Atraumatic.  Cardiovascular:     Rate and Rhythm: Normal rate and regular rhythm.  Pulmonary:     Breath sounds: No wheezing.  Abdominal:     Comments: Upper abdominal tenderness without rebound or guarding.  No hernia palpated.  Skin:    General: Skin is warm.     Capillary Refill: Capillary refill takes less than 2 seconds.  Neurological:     Mental Status: She is alert and oriented to person, place, and time.  Psychiatric:        Mood and Affect: Mood normal.     ED Results / Procedures / Treatments   Labs (all labs ordered are listed, but only abnormal results are displayed) Labs Reviewed  LIPASE, BLOOD - Abnormal; Notable for the following components:      Result Value   Lipase 215 (*)    All other components within normal limits  COMPREHENSIVE METABOLIC PANEL - Abnormal; Notable for the following components:   Glucose, Bld 106 (*)    BUN 5 (*)    Alkaline Phosphatase 28 (*)    Total Bilirubin 0.2 (*)    All other components within normal limits  CBC  URINALYSIS, ROUTINE W REFLEX MICROSCOPIC  PREGNANCY, URINE    EKG None  Radiology CT ABDOMEN PELVIS W CONTRAST  Result Date: 05/15/2020 CLINICAL DATA:  Upper abdominal pain EXAM: CT ABDOMEN AND PELVIS WITH CONTRAST TECHNIQUE: Multidetector CT imaging of the abdomen and pelvis was performed using the standard protocol following bolus administration of intravenous contrast. CONTRAST:  192m OMNIPAQUE IOHEXOL 300 MG/ML  SOLN COMPARISON:  None. FINDINGS: Lower chest: The visualized heart size within normal limits. No pericardial fluid/thickening. No hiatal hernia. The  visualized portions of the lungs are clear. Hepatobiliary: The liver is normal in density without focal abnormality.The main portal vein is patent. No evidence of calcified gallstones, gallbladder wall thickening or biliary dilatation. Pancreas: There is minimal fat stranding changes seen around the pancreatic head and proximal body with mild pancreatic ductal dilatation measuring up to 4 mm. No loculated fluid collections or free air is seen. Spleen: Normal in size without focal abnormality. Adrenals/Urinary Tract: Both adrenal glands appear normal. The kidneys and collecting system appear normal without evidence of urinary tract calculus or hydronephrosis. Bladder is unremarkable. Stomach/Bowel: The stomach, small bowel, and colon are normal in appearance. No inflammatory changes, wall thickening, or obstructive findings.The appendix is normal. Vascular/Lymphatic: There are no enlarged mesenteric, retroperitoneal, or pelvic lymph nodes. No significant vascular findings are present. Reproductive: There is a 3.2 cm low-density lesion within the left adnexa, likely ovarian cyst. A small amount of fluid seen within the endometrial canal. Other: No evidence of abdominal wall mass or hernia. Musculoskeletal: No acute or significant osseous findings. IMPRESSION: Findings which may be suggestive of acute mild pancreatitis and mild pancreatic ductal dilatation. No loculated fluid collection or evidence of pancreatic necrosis. Electronically Signed   By: BPrudencio PairM.D.   On: 05/15/2020 00:49    Procedures Procedures (including critical care time)  Medications Ordered in ED Medications  iohexol (OMNIPAQUE) 300 MG/ML solution 100 mL (100 mLs Intravenous Contrast Given 05/15/20 0030)    ED Course  I have reviewed the triage vital signs and the nursing notes.  Pertinent labs & imaging results that were available during my care of the patient were reviewed by me and considered in my medical decision making (see  chart for details).  MDM Rules/Calculators/A&P                          Patient presents with upper abdominal pain.  Has had for last 2 weeks to a month.  Worse after eating.  Lipase elevated.  CT scan done due to no history of pancreatitis.  CT scan shows only mild pancreatitis with some mild pancreatic duct dilatation.  No stones seen.  Patient states that she had been drinking a lot of alcohol around the holidays.  History of Crohn's disease but no active Crohn's disease on CT scan.  Will have patient follow-up with her gastroenterologist.  Pain medicine antiemetics given.  Think she can tolerate home.  Will discharge. Final Clinical Impression(s) / ED Diagnoses Final diagnoses:  Acute pancreatitis, unspecified complication status, unspecified pancreatitis type    Rx / DC Orders ED Discharge Orders         Ordered    ondansetron (ZOFRAN-ODT) 4 MG disintegrating tablet  Every 8 hours PRN        05/15/20 0206    HYDROcodone-acetaminophen (NORCO/VICODIN) 5-325 MG tablet  Every 6 hours PRN        05/15/20 0206           Davonna Belling, MD 05/15/20 [phone]

## 2020-05-15 NOTE — Telephone Encounter (Signed)
Rescheduled patient to 05/23/20. Patient agreed. Let her know to stay away from any alcohol.

## 2020-05-15 NOTE — Discharge Instructions (Signed)
Follow-up with your gastroenterologist.  Try and keep yourself hydrated but go with clear liquids to start with.

## 2020-05-15 NOTE — ED Notes (Signed)
Patient transported to CT 

## 2020-05-16 ENCOUNTER — Ambulatory Visit (HOSPITAL_COMMUNITY): Payer: BC Managed Care – PPO | Admitting: Hematology

## 2020-05-17 ENCOUNTER — Telehealth: Payer: Self-pay | Admitting: Gastroenterology

## 2020-05-17 NOTE — Telephone Encounter (Signed)
The pt states she has pancreatitis and was seen in the ED and was given narcotics.  She is asking for a refill until she is seen on 1/5 by [facility].  She has been advised that we do not prescribe narcotics and she has not been seen here since 2019.  She was advised that if she has more pain she should be seen in the ED.  The pt has been advised of the information and verbalized understanding.

## 2020-05-17 NOTE — Telephone Encounter (Signed)
Patient called states she is needing to discuss pain management with a nurse due to Pancreatitis prior to her appt

## 2020-05-18 ENCOUNTER — Inpatient Hospital Stay (HOSPITAL_COMMUNITY)
Admission: EM | Admit: 2020-05-18 | Discharge: 2020-05-24 | DRG: 440 | Disposition: A | Discharge status: Home / Self Care | Payer: BC Managed Care – PPO | Attending: Internal Medicine | Admitting: Internal Medicine

## 2020-05-18 ENCOUNTER — Other Ambulatory Visit: Payer: Self-pay | Admitting: Physician Assistant

## 2020-05-18 ENCOUNTER — Other Ambulatory Visit: Payer: Self-pay

## 2020-05-18 DIAGNOSIS — J45909 Unspecified asthma, uncomplicated: Secondary | ICD-10-CM | POA: Diagnosis not present

## 2020-05-18 DIAGNOSIS — Z79899 Other long term (current) drug therapy: Secondary | ICD-10-CM

## 2020-05-18 DIAGNOSIS — D500 Iron deficiency anemia secondary to blood loss (chronic): Secondary | ICD-10-CM | POA: Diagnosis not present

## 2020-05-18 DIAGNOSIS — D8989 Other specified disorders involving the immune mechanism, not elsewhere classified: Secondary | ICD-10-CM | POA: Diagnosis not present

## 2020-05-18 DIAGNOSIS — Z8261 Family history of arthritis: Secondary | ICD-10-CM | POA: Diagnosis not present

## 2020-05-18 DIAGNOSIS — K8500 Idiopathic acute pancreatitis without necrosis or infection: Secondary | ICD-10-CM | POA: Diagnosis not present

## 2020-05-18 DIAGNOSIS — R195 Other fecal abnormalities: Secondary | ICD-10-CM | POA: Diagnosis not present

## 2020-05-18 DIAGNOSIS — Z808 Family history of malignant neoplasm of other organs or systems: Secondary | ICD-10-CM

## 2020-05-18 DIAGNOSIS — R1013 Epigastric pain: Secondary | ICD-10-CM

## 2020-05-18 DIAGNOSIS — Z8262 Family history of osteoporosis: Secondary | ICD-10-CM | POA: Diagnosis not present

## 2020-05-18 DIAGNOSIS — K5903 Drug induced constipation: Secondary | ICD-10-CM | POA: Diagnosis not present

## 2020-05-18 DIAGNOSIS — Z87410 Personal history of cervical dysplasia: Secondary | ICD-10-CM | POA: Diagnosis not present

## 2020-05-18 DIAGNOSIS — F32A Depression, unspecified: Secondary | ICD-10-CM | POA: Diagnosis present

## 2020-05-18 DIAGNOSIS — Z20822 Contact with and (suspected) exposure to covid-19: Secondary | ICD-10-CM | POA: Diagnosis present

## 2020-05-18 DIAGNOSIS — K50119 Crohn's disease of large intestine with unspecified complications: Secondary | ICD-10-CM | POA: Diagnosis not present

## 2020-05-18 DIAGNOSIS — Z825 Family history of asthma and other chronic lower respiratory diseases: Secondary | ICD-10-CM | POA: Diagnosis not present

## 2020-05-18 DIAGNOSIS — K858 Other acute pancreatitis without necrosis or infection: Principal | ICD-10-CM | POA: Diagnosis present

## 2020-05-18 DIAGNOSIS — K859 Acute pancreatitis without necrosis or infection, unspecified: Secondary | ICD-10-CM | POA: Diagnosis present

## 2020-05-18 DIAGNOSIS — Z8249 Family history of ischemic heart disease and other diseases of the circulatory system: Secondary | ICD-10-CM

## 2020-05-18 DIAGNOSIS — R109 Unspecified abdominal pain: Secondary | ICD-10-CM | POA: Diagnosis not present

## 2020-05-18 DIAGNOSIS — K219 Gastro-esophageal reflux disease without esophagitis: Secondary | ICD-10-CM | POA: Diagnosis present

## 2020-05-18 DIAGNOSIS — Z8719 Personal history of other diseases of the digestive system: Secondary | ICD-10-CM | POA: Diagnosis not present

## 2020-05-18 DIAGNOSIS — Z823 Family history of stroke: Secondary | ICD-10-CM

## 2020-05-18 DIAGNOSIS — E162 Hypoglycemia, unspecified: Secondary | ICD-10-CM

## 2020-05-18 DIAGNOSIS — K7689 Other specified diseases of liver: Secondary | ICD-10-CM | POA: Diagnosis not present

## 2020-05-18 DIAGNOSIS — K861 Other chronic pancreatitis: Secondary | ICD-10-CM | POA: Diagnosis present

## 2020-05-18 DIAGNOSIS — Z793 Long term (current) use of hormonal contraceptives: Secondary | ICD-10-CM

## 2020-05-18 DIAGNOSIS — Z841 Family history of disorders of kidney and ureter: Secondary | ICD-10-CM

## 2020-05-18 DIAGNOSIS — R932 Abnormal findings on diagnostic imaging of liver and biliary tract: Secondary | ICD-10-CM | POA: Diagnosis not present

## 2020-05-18 DIAGNOSIS — K2950 Unspecified chronic gastritis without bleeding: Secondary | ICD-10-CM | POA: Diagnosis not present

## 2020-05-18 DIAGNOSIS — J4540 Moderate persistent asthma, uncomplicated: Secondary | ICD-10-CM | POA: Diagnosis not present

## 2020-05-18 DIAGNOSIS — K5909 Other constipation: Secondary | ICD-10-CM | POA: Diagnosis not present

## 2020-05-18 DIAGNOSIS — Z7951 Long term (current) use of inhaled steroids: Secondary | ICD-10-CM | POA: Diagnosis not present

## 2020-05-18 DIAGNOSIS — K50111 Crohn's disease of large intestine with rectal bleeding: Secondary | ICD-10-CM | POA: Diagnosis not present

## 2020-05-18 DIAGNOSIS — K5090 Crohn's disease, unspecified, without complications: Secondary | ICD-10-CM | POA: Diagnosis present

## 2020-05-18 DIAGNOSIS — R935 Abnormal findings on diagnostic imaging of other abdominal regions, including retroperitoneum: Secondary | ICD-10-CM | POA: Diagnosis not present

## 2020-05-18 LAB — CBC
HCT: 38.3 % (ref 36.0–46.0)
Hemoglobin: 12.9 g/dL (ref 12.0–15.0)
MCH: 30 pg (ref 26.0–34.0)
MCHC: 33.7 g/dL (ref 30.0–36.0)
MCV: 89.1 fL (ref 80.0–100.0)
Platelets: 297 10*3/uL (ref 150–400)
RBC: 4.3 MIL/uL (ref 3.87–5.11)
RDW: 13.7 % (ref 11.5–15.5)
WBC: 8 10*3/uL (ref 4.0–10.5)
nRBC: 0 % (ref 0.0–0.2)

## 2020-05-18 LAB — COMPREHENSIVE METABOLIC PANEL
ALT: 25 U/L (ref 0–44)
AST: 19 U/L (ref 15–41)
Albumin: 3.9 g/dL (ref 3.5–5.0)
Alkaline Phosphatase: 30 U/L — ABNORMAL LOW (ref 38–126)
Anion gap: 7 (ref 5–15)
BUN: <5 mg/dL — ABNORMAL LOW (ref 6–20)
CO2: 30 mmol/L (ref 22–32)
Calcium: 9.6 mg/dL (ref 8.9–10.3)
Chloride: 99 mmol/L (ref 98–111)
Creatinine, Ser: 0.75 mg/dL (ref 0.44–1.00)
GFR, Estimated: >60 mL/min (ref >60)
Glucose, Bld: 102 mg/dL — ABNORMAL HIGH (ref 70–99)
Potassium: 4.7 mmol/L (ref 3.5–5.1)
Sodium: 136 mmol/L (ref 135–145)
Total Bilirubin: 0.5 mg/dL (ref 0.3–1.2)
Total Protein: 6.5 g/dL (ref 6.5–8.1)

## 2020-05-18 LAB — URINALYSIS, ROUTINE W REFLEX MICROSCOPIC
Bilirubin Urine: NEGATIVE
Glucose, UA: NEGATIVE mg/dL
Hgb urine dipstick: NEGATIVE
Ketones, ur: NEGATIVE mg/dL
Nitrite: NEGATIVE
Protein, ur: NEGATIVE mg/dL
Specific Gravity, Urine: 1.004 — ABNORMAL LOW (ref 1.005–1.030)
pH: 8 (ref 5.0–8.0)

## 2020-05-18 LAB — LIPASE, BLOOD: Lipase: 267 U/L — ABNORMAL HIGH (ref 11–51)

## 2020-05-18 NOTE — ED Triage Notes (Signed)
Pt dx with pancreatitis on Monday, given rx for pain; however, she sts rx not working and pain is continuing to worsen. Denies n/v.

## 2020-05-19 ENCOUNTER — Encounter (HOSPITAL_COMMUNITY): Payer: Self-pay | Admitting: Internal Medicine

## 2020-05-19 ENCOUNTER — Inpatient Hospital Stay (HOSPITAL_COMMUNITY): Payer: BC Managed Care – PPO

## 2020-05-19 DIAGNOSIS — K8500 Idiopathic acute pancreatitis without necrosis or infection: Secondary | ICD-10-CM | POA: Diagnosis not present

## 2020-05-19 DIAGNOSIS — Z87410 Personal history of cervical dysplasia: Secondary | ICD-10-CM | POA: Diagnosis not present

## 2020-05-19 DIAGNOSIS — K219 Gastro-esophageal reflux disease without esophagitis: Secondary | ICD-10-CM | POA: Diagnosis present

## 2020-05-19 DIAGNOSIS — Z8719 Personal history of other diseases of the digestive system: Secondary | ICD-10-CM | POA: Diagnosis not present

## 2020-05-19 DIAGNOSIS — K5909 Other constipation: Secondary | ICD-10-CM | POA: Diagnosis present

## 2020-05-19 DIAGNOSIS — K861 Other chronic pancreatitis: Secondary | ICD-10-CM | POA: Diagnosis present

## 2020-05-19 DIAGNOSIS — K859 Acute pancreatitis without necrosis or infection, unspecified: Secondary | ICD-10-CM | POA: Diagnosis present

## 2020-05-19 DIAGNOSIS — Z7951 Long term (current) use of inhaled steroids: Secondary | ICD-10-CM | POA: Diagnosis not present

## 2020-05-19 DIAGNOSIS — D500 Iron deficiency anemia secondary to blood loss (chronic): Secondary | ICD-10-CM | POA: Diagnosis present

## 2020-05-19 DIAGNOSIS — F32A Depression, unspecified: Secondary | ICD-10-CM | POA: Diagnosis present

## 2020-05-19 DIAGNOSIS — K858 Other acute pancreatitis without necrosis or infection: Secondary | ICD-10-CM | POA: Diagnosis present

## 2020-05-19 DIAGNOSIS — D8989 Other specified disorders involving the immune mechanism, not elsewhere classified: Secondary | ICD-10-CM | POA: Diagnosis present

## 2020-05-19 DIAGNOSIS — R1013 Epigastric pain: Secondary | ICD-10-CM | POA: Diagnosis not present

## 2020-05-19 DIAGNOSIS — Z793 Long term (current) use of hormonal contraceptives: Secondary | ICD-10-CM | POA: Diagnosis not present

## 2020-05-19 DIAGNOSIS — K50119 Crohn's disease of large intestine with unspecified complications: Secondary | ICD-10-CM | POA: Diagnosis not present

## 2020-05-19 DIAGNOSIS — Z8261 Family history of arthritis: Secondary | ICD-10-CM | POA: Diagnosis not present

## 2020-05-19 DIAGNOSIS — Z825 Family history of asthma and other chronic lower respiratory diseases: Secondary | ICD-10-CM | POA: Diagnosis not present

## 2020-05-19 DIAGNOSIS — J45909 Unspecified asthma, uncomplicated: Secondary | ICD-10-CM | POA: Diagnosis present

## 2020-05-19 DIAGNOSIS — Z8249 Family history of ischemic heart disease and other diseases of the circulatory system: Secondary | ICD-10-CM | POA: Diagnosis not present

## 2020-05-19 DIAGNOSIS — Z8262 Family history of osteoporosis: Secondary | ICD-10-CM | POA: Diagnosis not present

## 2020-05-19 DIAGNOSIS — E162 Hypoglycemia, unspecified: Secondary | ICD-10-CM | POA: Diagnosis not present

## 2020-05-19 DIAGNOSIS — Z841 Family history of disorders of kidney and ureter: Secondary | ICD-10-CM | POA: Diagnosis not present

## 2020-05-19 DIAGNOSIS — Z823 Family history of stroke: Secondary | ICD-10-CM | POA: Diagnosis not present

## 2020-05-19 DIAGNOSIS — Z79899 Other long term (current) drug therapy: Secondary | ICD-10-CM | POA: Diagnosis not present

## 2020-05-19 DIAGNOSIS — R195 Other fecal abnormalities: Secondary | ICD-10-CM | POA: Diagnosis present

## 2020-05-19 DIAGNOSIS — Z20822 Contact with and (suspected) exposure to covid-19: Secondary | ICD-10-CM | POA: Diagnosis present

## 2020-05-19 DIAGNOSIS — Z808 Family history of malignant neoplasm of other organs or systems: Secondary | ICD-10-CM | POA: Diagnosis not present

## 2020-05-19 DIAGNOSIS — K50111 Crohn's disease of large intestine with rectal bleeding: Secondary | ICD-10-CM | POA: Diagnosis not present

## 2020-05-19 LAB — CBC WITH DIFFERENTIAL/PLATELET
Abs Immature Granulocytes: 0.03 10*3/uL (ref 0.00–0.07)
Basophils Absolute: 0 10*3/uL (ref 0.0–0.1)
Basophils Relative: 1 %
Eosinophils Absolute: 0.2 10*3/uL (ref 0.0–0.5)
Eosinophils Relative: 3 %
HCT: 35.6 % — ABNORMAL LOW (ref 36.0–46.0)
Hemoglobin: 12 g/dL (ref 12.0–15.0)
Immature Granulocytes: 0 %
Lymphocytes Relative: 17 %
Lymphs Abs: 1.3 10*3/uL (ref 0.7–4.0)
MCH: 30.1 pg (ref 26.0–34.0)
MCHC: 33.7 g/dL (ref 30.0–36.0)
MCV: 89.2 fL (ref 80.0–100.0)
Monocytes Absolute: 0.6 10*3/uL (ref 0.1–1.0)
Monocytes Relative: 8 %
Neutro Abs: 5.5 10*3/uL (ref 1.7–7.7)
Neutrophils Relative %: 71 %
Platelets: 270 10*3/uL (ref 150–400)
RBC: 3.99 MIL/uL (ref 3.87–5.11)
RDW: 13.5 % (ref 11.5–15.5)
WBC: 7.6 10*3/uL (ref 4.0–10.5)
nRBC: 0 % (ref 0.0–0.2)

## 2020-05-19 LAB — HIV ANTIBODY (ROUTINE TESTING W REFLEX): HIV Screen 4th Generation wRfx: NONREACTIVE

## 2020-05-19 LAB — SARS CORONAVIRUS 2 (TAT 6-24 HRS): SARS Coronavirus 2: NEGATIVE

## 2020-05-19 LAB — GLUCOSE, CAPILLARY
Glucose-Capillary: 64 mg/dL — ABNORMAL LOW (ref 70–99)
Glucose-Capillary: 67 mg/dL — ABNORMAL LOW (ref 70–99)
Glucose-Capillary: 67 mg/dL — ABNORMAL LOW (ref 70–99)
Glucose-Capillary: 67 mg/dL — ABNORMAL LOW (ref 70–99)

## 2020-05-19 LAB — BASIC METABOLIC PANEL
Anion gap: 10 (ref 5–15)
BUN: <5 mg/dL — ABNORMAL LOW (ref 6–20)
CO2: 23 mmol/L (ref 22–32)
Calcium: 8.7 mg/dL — ABNORMAL LOW (ref 8.9–10.3)
Chloride: 103 mmol/L (ref 98–111)
Creatinine, Ser: 0.63 mg/dL (ref 0.44–1.00)
GFR, Estimated: >60 mL/min (ref >60)
Glucose, Bld: 90 mg/dL (ref 70–99)
Potassium: 4.2 mmol/L (ref 3.5–5.1)
Sodium: 136 mmol/L (ref 135–145)

## 2020-05-19 LAB — HEPATIC FUNCTION PANEL
ALT: 19 U/L (ref 0–44)
AST: 18 U/L (ref 15–41)
Albumin: 3.5 g/dL (ref 3.5–5.0)
Alkaline Phosphatase: 27 U/L — ABNORMAL LOW (ref 38–126)
Bilirubin, Direct: 0.2 mg/dL (ref 0.0–0.2)
Indirect Bilirubin: 0.4 mg/dL (ref 0.3–0.9)
Total Bilirubin: 0.6 mg/dL (ref 0.3–1.2)
Total Protein: 5.8 g/dL — ABNORMAL LOW (ref 6.5–8.1)

## 2020-05-19 LAB — PREGNANCY, URINE: Preg Test, Ur: NEGATIVE

## 2020-05-19 LAB — TRIGLYCERIDES: Triglycerides: 93 mg/dL (ref <150)

## 2020-05-19 MED ORDER — CETIRIZINE HCL 5 MG/5ML PO SOLN
5.0000 mg | Freq: Every day | ORAL | Status: DC
  Administered 2020-05-20 – 2020-05-23 (×4): 5 mg via ORAL
  Filled 2020-05-19 (×6): qty 5

## 2020-05-19 MED ORDER — DIPHENHYDRAMINE HCL 50 MG/ML IJ SOLN
25.0000 mg | Freq: Four times a day (QID) | INTRAMUSCULAR | Status: DC | PRN
  Administered 2020-05-19 – 2020-05-20 (×2): 25 mg via INTRAVENOUS
  Filled 2020-05-19 (×3): qty 1

## 2020-05-19 MED ORDER — LACTATED RINGERS IV SOLN: INTRAVENOUS | Status: DC

## 2020-05-19 MED ORDER — HEPARIN SODIUM (PORCINE) 5000 UNIT/ML IJ SOLN
5000.0000 [IU] | Freq: Three times a day (TID) | INTRAMUSCULAR | Status: DC
  Administered 2020-05-19 – 2020-05-20 (×4): 5000 [IU] via SUBCUTANEOUS
  Filled 2020-05-19 (×11): qty 1

## 2020-05-19 MED ORDER — DIPHENHYDRAMINE HCL 50 MG/ML IJ SOLN
25.0000 mg | Freq: Once | INTRAMUSCULAR | Status: AC
  Administered 2020-05-19: 25 mg via INTRAVENOUS
  Filled 2020-05-19: qty 1

## 2020-05-19 MED ORDER — ACETAMINOPHEN 650 MG RE SUPP: 650.0000 mg | Freq: Four times a day (QID) | RECTAL | Status: DC | PRN

## 2020-05-19 MED ORDER — SODIUM CHLORIDE 0.9 % IV SOLN: INTRAVENOUS | Status: DC

## 2020-05-19 MED ORDER — HYDROMORPHONE HCL 1 MG/ML IJ SOLN
1.0000 mg | Freq: Once | INTRAMUSCULAR | Status: AC
  Administered 2020-05-19: 1 mg via INTRAVENOUS
  Filled 2020-05-19: qty 1

## 2020-05-19 MED ORDER — HYDROMORPHONE HCL 1 MG/ML IJ SOLN
0.5000 mg | INTRAMUSCULAR | Status: DC | PRN
  Administered 2020-05-19 – 2020-05-21 (×19): 0.5 mg via INTRAVENOUS
  Filled 2020-05-19 (×19): qty 1

## 2020-05-19 MED ORDER — ONDANSETRON HCL 4 MG/2ML IJ SOLN
4.0000 mg | Freq: Once | INTRAMUSCULAR | Status: AC
  Administered 2020-05-19: 4 mg via INTRAVENOUS
  Filled 2020-05-19: qty 2

## 2020-05-19 MED ORDER — GADOBUTROL 1 MMOL/ML IV SOLN
7.5000 mL | Freq: Once | INTRAVENOUS | Status: AC | PRN
  Administered 2020-05-19: 7 mL via INTRAVENOUS

## 2020-05-19 MED ORDER — ACETAMINOPHEN 325 MG PO TABS
650.0000 mg | ORAL_TABLET | Freq: Four times a day (QID) | ORAL | Status: DC | PRN
  Administered 2020-05-20 – 2020-05-23 (×7): 650 mg via ORAL
  Filled 2020-05-19 (×9): qty 2

## 2020-05-19 MED ORDER — LACTATED RINGERS IV BOLUS
2000.0000 mL | Freq: Once | INTRAVENOUS | Status: AC
  Administered 2020-05-19: 2000 mL via INTRAVENOUS

## 2020-05-19 MED ORDER — DEXTROSE 50 % IV SOLN
INTRAVENOUS | Status: AC
  Administered 2020-05-19: 25 mL
  Filled 2020-05-19: qty 50

## 2020-05-19 NOTE — ED Notes (Signed)
Attempted report x1. 

## 2020-05-19 NOTE — Progress Notes (Signed)
PROGRESS NOTE  Sonia [name] YYT:[id number] DOB: 10/08/1985 DOA: 05/18/2020 PCP: Caren Macadam, MD  Brief History   Sonia [name] is a 35 y.o. female with history of Crohn's disease in remission follows up with [place] gastroenterologist Dr. Fuller Plan has been experiencing epigastric pain radiating to the back and left side of the chest for the last 1 week had gone to the ER on Monday 5 days ago at that time labs showed elevated lipase and CT scan showed features concerning for pancreatitis.  Patient was discharged home on pain medication.  Patient comes back to the ER because of worsening pain.  Denies nausea vomiting or diarrhea.  Denies any fever chills.  Patient states he drinks alcohol usually 4-5 beers in the weekend.  ED Course: In the ER repeat labs show elevated lipase normal LFTs.  Patient is started on IV fluids pain relief medication and admitted for acute pancreatitis.  MRCP was obtained and demonstrated pancreatitis as well as accumulation of hemosiderin in the liver and spleen. The patient does recall having an iron infusion in October due to iron deficiency anemia. No family history of hemochromatosis. Gastroenterology has been consulted. 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 Supportive care.  Consultants  . Gastroenterology  Procedures  . MRCP  Antibiotics   Anti-infectives (From admission, onward)   None    .  Subjective  The patient is resting comfortably. She continues to have severe abdominal pain.  Objective   Vitals:  Vitals:   05/19/20 0356 05/19/20 0903  BP: 131/89 115/77  Pulse: 75 69  Resp: 18 18  Temp:  97.9 F (36.6 C)  SpO2: 100% 100%    Exam:  Constitutional:  . The patient is awake, alert, and oriented x 3. No acute distress. Respiratory:  . No increased work of breathing. . No wheezes, rales, or rhonchi . No tactile fremitus Cardiovascular:  . Regular rate and rhythm . No murmurs, ectopy, or  gallups. . No lateral PMI. No thrills. Abdomen:  . Abdomen is distended and tender diffusely and especially in the epigastrum . No hernias, masses, or organomegaly . Hypoactive bowel sounds.  Musculoskeletal:  . No cyanosis, clubbing, or edema Skin:  . No rashes, lesions, ulcers . palpation of skin: no induration or nodules Neurologic:  . CN 2-12 intact . Sensation all 4 extremities intact Psychiatric:  . Mental status o Mood, affect appropriate o Orientation to person, place, time  . judgment and insight appear intact  I have personally reviewed the following:   Today's Data  . Vitals, CBC, CMP  Imaging  . CT abdomen and pelvis . MRCP  Scheduled Meds: . cetirizine HCl  5 mg Oral Daily  . heparin  5,000 Units Subcutaneous Q8H   Continuous Infusions: . lactated ringers 150 mL/hr at 05/19/20 2751    Principal Problem:   Acute pancreatitis   LOS: 0 days    A & P  Acute pancreatitis cause not clear.  Patient states she drinks about 4-5 beers in a week.  CT scan does not show any definite gallstones but does show some dilated pancreatic duct for which I have ordered MRCP.  Triglyceride level was within normal levels. We will continue with aggressive hydration pain relief medication. [facility] has been consulted. MRI is remarkable for acute pancreatitis and iron deposition in the liver and spleen. I appreciate   History of Crohn's disease in remission.  History of asthma not wheezing.  Iron deficiency anemia: Will check anemia panel.  I have seen and examined this patient myself. I have spent 40 minutes in her evaluation and care.  DVT prophylaxis: Lovenox. Code Status: Full code. Family Communication: Discussed with patient. Disposition Plan: Home.  Axil Copeman, DO [facility] Direct contact: see [email or web address]  7PM-7AM contact night coverage as above 05/19/2020, 4:22 PM  LOS: 0 days

## 2020-05-19 NOTE — ED Notes (Signed)
Pt transported to MRI 

## 2020-05-19 NOTE — H&P (Signed)
History and Physical    Sonia [name] RFF:[id number] DOB: [birth year] DOA: 05/18/2020  PCP: Caren Macadam, MD  Patient coming from: Home.  Chief Complaint: Abdominal pain.  HPI: Sonia [name] is a 35 y.o. female with history of Crohn's disease in remission follows up with [place] gastroenterologist Dr. Fuller Plan has been experiencing epigastric pain radiating to the back and left side of the chest for the last 1 week had gone to the ER on Monday 5 days ago at that time labs showed elevated lipase and CT scan showed features concerning for pancreatitis.  Patient was discharged home on pain medication.  Patient comes back to the ER because of worsening pain.  Denies nausea vomiting or diarrhea.  Denies any fever chills.  Patient states he drinks alcohol usually 4-5 beers in the weekend.  ED Course: In the ER repeat labs show elevated lipase normal LFTs.  Patient is started on IV fluids pain relief medication and admitted for acute pancreatitis.  Review of Systems: As per HPI, rest all negative.   Past Medical History:  Diagnosis Date  . COVID-19   . Crohn's colitis ([place])   . Depression   . GERD (gastroesophageal reflux disease)    no meds  . Normal labor 03/27/2013  . Postpartum care following cesarean delivery (11/9) 03/27/2013    Past Surgical History:  Procedure Laterality Date  . CESAREAN SECTION N/A 03/27/2013   Procedure: CESAREAN SECTION;  Surgeon: Lovenia Kim, MD;  Location: [facility];  Service: Obstetrics;  Laterality: N/A;  . CESAREAN SECTION N/A 09/04/2014   Procedure: REPEAT CESAREAN SECTION;  Surgeon: Crawford Givens, MD;  Location: [facility];  Service: Obstetrics;  Laterality: N/A;  . HAND SURGERY  2012   with nerve block  . LAPAROSCOPIC ENDOMETRIOSIS FULGURATION    . WISDOM TOOTH EXTRACTION       reports that she has never smoked. She has quit using smokeless tobacco.  Her smokeless tobacco use included chew. She reports current alcohol use. She reports that she  does not use drugs.  Allergies  Allergen Reactions  . Other Other (See Comments)    Mold, mildew, grass, fungus, trees cause swelling of eyes    Family History  Problem Relation Age of Onset  . COPD Mother   . Heart disease Mother        smoker  . Hernia Mother   . Diverticulitis Mother   . Kidney disease Mother   . Heart failure Mother   . Stroke Mother 14  . Osteoporosis Mother   . Anxiety disorder Mother        ? on xanax  . Rheum arthritis Mother   . Heart disease Maternal Grandmother        smoker  . Arthritis-Osteo Maternal Grandmother   . Cancer Maternal Grandfather        throat  . Alcohol abuse Maternal Grandfather   . Other Father        pituitary gland tumor  . Depression Sister   . Anxiety disorder Sister   . Colon cancer Neg Hx     Prior to Admission medications   Medication Sig Start Date End Date Taking? Authorizing Provider  albuterol (VENTOLIN HFA) 108 (90 Base) MCG/ACT inhaler Inhale 1-2 puffs into the lungs every 4 (four) hours as needed for wheezing or shortness of breath. 08/26/19   Jaynee Eagles, PA-C  budesonide-formoterol (SYMBICORT) 80-4.5 MCG/ACT inhaler Inhale 2 puffs into the lungs 2 (two) times a day.    [provider]  buPROPion (WELLBUTRIN SR) 150 MG 12 hr tablet TAKE 1 TABLET BY MOUTH TWICE A DAY 03/16/20   Koberlein, Andris Flurry C, MD  cetirizine (ZYRTEC) 10 MG tablet Take 10 mg by mouth daily.    [provider]  cetirizine-pseudoephedrine (ZYRTEC-D) 5-120 MG tablet Take 1 tablet by mouth daily. 04/11/20   Avegno, Darrelyn Hillock, FNP  diphenhydrAMINE (BENADRYL) 25 mg capsule Take 25 mg by mouth at bedtime as needed for allergies. For itching    [provider]  escitalopram (LEXAPRO) 10 MG tablet TAKE 1 TABLET (10 MG TOTAL) BY MOUTH IN THE MORNING AND AT BEDTIME. 03/06/20   Koberlein, Steele Berg, MD  HYDROcodone-acetaminophen (NORCO/VICODIN) 5-325 MG tablet Take 1-2 tablets by mouth every 6 (six) hours as needed. 05/15/20    Davonna Belling, MD  hydrocortisone 2.5 % cream Apply 1 application topically daily as needed (for itching-irritation).  12/05/19   [provider]  montelukast (SINGULAIR) 10 MG tablet Take 10 mg by mouth at bedtime.    [provider]  ofloxacin (OCUFLOX) 0.3 % ophthalmic solution Place 1 drop into the right eye 4 (four) times daily. 12/26/19   Avegno, Darrelyn Hillock, FNP  Olopatadine HCl 0.2 % SOLN Apply 1 drop to eye daily as needed. 12/26/19   Avegno, Darrelyn Hillock, FNP  ondansetron (ZOFRAN-ODT) 4 MG disintegrating tablet Take 1 tablet (4 mg total) by mouth every 8 (eight) hours as needed for nausea or vomiting. 05/15/20   Davonna Belling, MD  predniSONE (DELTASONE) 10 MG tablet Take 2 tablets (20 mg total) by mouth daily. 04/11/20   Avegno, Darrelyn Hillock, FNP  traZODone (DESYREL) 50 MG tablet Take 1 tablet (50 mg total) by mouth at bedtime. 12/16/19   Caren Macadam, MD  dicyclomine (BENTYL) 10 MG capsule Take 1 capsule (10 mg total) by mouth 3 (three) times daily before meals. 03/29/18 09/14/19  Zehr, Laban Emperor, PA-C  Norgestimate-Eth Estradiol (SPRINTEC 28 PO) Take by mouth.  09/14/19  [provider]    Physical Exam: Constitutional: Moderately built and nourished. Vitals:   05/18/20 1648 05/18/20 1955 05/19/20 0035 05/19/20 0356  BP: (!) 129/92 115/81 120/87 131/89  Pulse: 70 64 67 75  Resp: 18 16 16 18   Temp: 98.3 F (36.8 C)     TempSrc: Oral     SpO2: 99% 97% 100% 100%  Weight: 70.3 kg     Height: 5' 2"  (1.575 m)      Eyes: Anicteric no pallor. ENMT: No discharge from the ears eyes nose or mouth. Neck: No mass felt.  No neck rigidity. Respiratory: No rhonchi or crepitations. Cardiovascular: S1-S2 heard. Abdomen: Epigastric tenderness no guarding or rigidity. Musculoskeletal: No edema.   Skin: No rash. Neurologic: Alert awake oriented to time place and person.  Moves all extremities. Psychiatric: Appears normal.  Normal affect.   Labs on Admission:  I have personally reviewed following labs and imaging studies  CBC: Recent Labs  Lab 05/14/20 1845 05/18/20 1704  WBC 7.9 8.0  HGB 12.9 12.9  HCT 39.6 38.3  MCV 90.0 89.1  PLT 337 371   Basic Metabolic Panel: Recent Labs  Lab 05/14/20 1845 05/18/20 1704  NA 135 136  K 3.9 4.7  CL 99 99  CO2 26 30  GLUCOSE 106* 102*  BUN 5* <5*  CREATININE 0.71 0.75  CALCIUM 8.9 9.6   GFR: Estimated Creatinine Clearance: 91 mL/min (by C-G formula based on SCr of 0.75 mg/dL). Liver Function Tests: Recent Labs  Lab  05/14/20 1845 05/18/20 1704  AST 21 19  ALT 23 25  ALKPHOS 28* 30*  BILITOT 0.2* 0.5  PROT 7.0 6.5  ALBUMIN 4.4 3.9   Recent Labs  Lab 05/14/20 1845 05/18/20 1704  LIPASE 215* 267*   No results for input(s): AMMONIA in the last 168 hours. Coagulation Profile: No results for input(s): INR, PROTIME in the last 168 hours. Cardiac Enzymes: No results for input(s): CKTOTAL, CKMB, CKMBINDEX, TROPONINI in the last 168 hours. BNP (last 3 results) No results for input(s): PROBNP in the last 8760 hours. HbA1C: No results for input(s): HGBA1C in the last 72 hours. CBG: No results for input(s): GLUCAP in the last 168 hours. Lipid Profile: No results for input(s): CHOL, HDL, LDLCALC, TRIG, CHOLHDL, LDLDIRECT in the last 72 hours. Thyroid Function Tests: No results for input(s): TSH, T4TOTAL, FREET4, T3FREE, THYROIDAB in the last 72 hours. Anemia Panel: No results for input(s): VITAMINB12, FOLATE, FERRITIN, TIBC, IRON, RETICCTPCT in the last 72 hours. Urine analysis:    Component Value Date/Time   COLORURINE YELLOW 05/18/2020 1721   APPEARANCEUR CLOUDY (A) 05/18/2020 1721   LABSPEC 1.004 (L) 05/18/2020 1721   PHURINE 8.0 05/18/2020 1721   GLUCOSEU NEGATIVE 05/18/2020 1721   GLUCOSEU NEGATIVE 10/19/2008 1243   HGBUR NEGATIVE 05/18/2020 1721   BILIRUBINUR NEGATIVE 05/18/2020 1721   BILIRUBINUR n 11/15/2018 1206   KETONESUR NEGATIVE 05/18/2020 1721   PROTEINUR  NEGATIVE 05/18/2020 1721   UROBILINOGEN 0.2 11/15/2018 1206   UROBILINOGEN 0.2 01/08/2014 1700   NITRITE NEGATIVE 05/18/2020 1721   LEUKOCYTESUR MODERATE (A) 05/18/2020 1721   Sepsis Labs: @LABRCNTIP (procalcitonin:4,lacticidven:4) )No results found for this or any previous visit (from the past 240 hour(s)).   Radiological Exams on Admission: No results found.  EKG: Independently reviewed.  Normal sinus rhythm with nonspecific ST-T changes.  Assessment/Plan Principal Problem:   Acute pancreatitis    1. Acute pancreatitis cause not clear.  Patient states she drinks about 4-5 beers in a week.  CT scan does not show any definite gallstones but does show some dilated pancreatic duct for which I have ordered MRCP.  Will check triglyceride levels continue with aggressive hydration pain relief medication consult [facility] in the morning. 2. History of Crohn's disease in remission. 3. History of asthma not wheezing.  Since patient has acute pancreatitis will need close monitoring for any further worsening in inpatient status.  Covid test pending.   DVT prophylaxis: Lovenox. Code Status: Full code. Family Communication: Discussed with patient. Disposition Plan: Home. Consults called: [facility] sent secure chat message. Admission status: Inpatient.   Rise Patience MD [facility] Pager [phone].  If 7PM-7AM, please contact night-coverage [email or web address] Password [facility]  05/19/2020, 5:51 AM

## 2020-05-19 NOTE — ED Notes (Signed)
According to PT, last PO intake 0544

## 2020-05-19 NOTE — Consult Note (Addendum)
Referring Provider:  [facility]         Primary Care Physician:  Caren Macadam, MD Primary Gastroenterologist:   Lucio Edward, MD           We were asked to see this patient for:    pancreatitis             ASSESSMENT / PLAN:    # 35 yo female with LUQ pain / back pain. CT scan 12/28 in ED suggested mild pancreatitis. Discharged home with GI follow up. Patient now admitted with worsening upper abdominal pain. Lipase in 260s.  MRCP suggests mild pancreatitis. No biliary duct dilation. HCT 35, renal function normal. Dilaudid controlling pain. Etiology of pancreatitis unclear. No evidence for biliary origin. Triglycerides normal. Ca+ okay. No obvious culprit medications. She doesn't drink Etoh every weekend. On weekends she does drink it is only ~ 5-6 beers over the course of the weekend so Etoh probably not culprit. --Check IgG4 and ANCA for autoimmune pancreatitis --Supportive care for now.    # Hx of mild Crohn's colitis, diagnosed in 2008 and treated with lialda. For the most part she has been off treatment for years. Office visits have been sporadic. Last seen in office in October 2019, Lialda resumed at that time but she hasn't been taking it. Overall she has been in clinical remission. She was occasionally having rectal bleeding with BMs but over last few weeks this has become more frequent. Bleeding could be hemorrhoidal. No bowel wall abnormalities / active Crohn's on recent CT scan.  --Need to address Crohn's at follow up office visit . Glad she is in clinical remission off medication but that doesn't always equate to endoscopic remission.   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 She actually has a history of symptomatic iron deficiency and in fact received iron infusions a couple of months ago.  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  --obtain tTg, Iga      HPI:                                                                                                                              Chief Complaint:   Sonia [name] is a 35 y.o. female with PMH significant for cervical dysplasia / LEEP May 2020, Crohn's colitis diagnosed 2008, COVID infection, IDA.  She has not followed up with [place] on a regular basis. She has sometimes gone years without being seen as Crohn's symptoms have been in remission.  She was last seen in October 2019. She had been off Lialda for ~ 2 years. Her LFTs , CBC and CRP were normal. CT enterography was done and negative for active disease. There was an unusual enhancement pattern in segment of liver but follow up MRI suggested benign perfusion anomaly. We resumed Lialda. Patient hasn't been seen in office since but she did recently call for an appt after being  diagnosed with pancreatitis by ED on 12/28. CT scan suggestive of acute mild pancreatitis and mild PD dilation.  She was given an appt for mid January but called back to be seen sooner so has an appt this Monday. Patient called office again on 12/30 , pain not controlled. She presented to ED yesterday with worsening abdominal pain / chest pain, she said that pain med was no longer controlling pain.  No ischemic abnormality on EKG. LFTs are normal but lipase is in upper 200's. WBC normal. Hgb okay at 12. MRCP suggests mild pancreatitis. No gallstones. No biliary duct dilation. She complains of constant LUQ pain and generalized back pain. The LUQ pain is worse with any PO intake. She has nausea but no vomiting. She continues to have the abdominal pain once Dilaudid wears off.    Regarding hx of Crohn's colitis. Patient hasn't had any diarrhea but does describe mucoid stools.  She has chronic constipation. Takes fiber gummies. She has occasionally had bright red blood in stools but over last several weeks the epispodes of bleeding have become more frequent.   PREVIOUS ENDOSCOPIC EVALUATIONS / [place] [facility] STUDIES   April 2017  colonoscopy  -A few erosions in the terminal ileum. Biopsied. - A few erosions at the ileocecal valve. Biopsied. - Congested mucosa in the descending colon. Biopsied. - Granularity in the recto-sigmoid colon. Biopsied. - Small Grade I internal hemorrhoids.   Surgical [P], terminal ileum erosions - UNREMARKABLE ILEAL MUCOSA. - NO ACTIVE INFLAMMATION OR GRANULOMAS. 2. Surgical [P], ileocecal valve erosions biopsy - EROSION WITH MILDLY INFLAMED GRANULATION TISSUE. - NO GRANULOMAS OR DYSPLASIA. - SEE COMMENT. Surgical [P], descending, sigmoid, rectal biopsy - BENIGN COLONIC MUCOSA WITH BENIGN LYMPHOID AGGREGATES. - NO ACTIVE INFLAMMATION, GRANULOMAS OR DYSPLASIA.   Past Medical History:  Diagnosis Date  . COVID-19   . Crohn's colitis ([place])   . Depression   . GERD (gastroesophageal reflux disease)    no meds  . Normal labor 03/27/2013  . Postpartum care following cesarean delivery (11/9) 03/27/2013    Past Surgical History:  Procedure Laterality Date  . CESAREAN SECTION N/A 03/27/2013   Procedure: CESAREAN SECTION;  Surgeon: Lovenia Kim, MD;  Location: [facility];  Service: Obstetrics;  Laterality: N/A;  . CESAREAN SECTION N/A 09/04/2014   Procedure: REPEAT CESAREAN SECTION;  Surgeon: Crawford Givens, MD;  Location: [facility];  Service: Obstetrics;  Laterality: N/A;  . HAND SURGERY  2012   with nerve block  . LAPAROSCOPIC ENDOMETRIOSIS FULGURATION    . WISDOM TOOTH EXTRACTION      Prior to Admission medications   Medication Sig Start Date End Date Taking? Authorizing Provider  albuterol (VENTOLIN HFA) 108 (90 Base) MCG/ACT inhaler Inhale 1-2 puffs into the lungs every 4 (four) hours as needed for wheezing or shortness of breath. 08/26/19   Jaynee Eagles, PA-C  budesonide-formoterol (SYMBICORT) 80-4.5 MCG/ACT inhaler Inhale 2 puffs into the lungs 2 (two) times a day.    [provider]  buPROPion (WELLBUTRIN SR) 150 MG 12 hr tablet TAKE 1 TABLET BY MOUTH TWICE A DAY 03/16/20    Koberlein, Andris Flurry C, MD  cetirizine (ZYRTEC) 10 MG tablet Take 10 mg by mouth daily.    [provider]  cetirizine-pseudoephedrine (ZYRTEC-D) 5-120 MG tablet Take 1 tablet by mouth daily. 04/11/20   Avegno, Darrelyn Hillock, FNP  diphenhydrAMINE (BENADRYL) 25 mg capsule Take 25 mg by mouth at bedtime as needed for allergies. For itching    [provider]  escitalopram (LEXAPRO)  10 MG tablet TAKE 1 TABLET (10 MG TOTAL) BY MOUTH IN THE MORNING AND AT BEDTIME. 03/06/20   Koberlein, Steele Berg, MD  HYDROcodone-acetaminophen (NORCO/VICODIN) 5-325 MG tablet Take 1-2 tablets by mouth every 6 (six) hours as needed. 05/15/20   Davonna Belling, MD  hydrocortisone 2.5 % cream Apply 1 application topically daily as needed (for itching-irritation).  12/05/19   [provider]  montelukast (SINGULAIR) 10 MG tablet Take 10 mg by mouth at bedtime.    [provider]  ofloxacin (OCUFLOX) 0.3 % ophthalmic solution Place 1 drop into the right eye 4 (four) times daily. 12/26/19   Avegno, Darrelyn Hillock, FNP  Olopatadine HCl 0.2 % SOLN Apply 1 drop to eye daily as needed. 12/26/19   Avegno, Darrelyn Hillock, FNP  ondansetron (ZOFRAN-ODT) 4 MG disintegrating tablet Take 1 tablet (4 mg total) by mouth every 8 (eight) hours as needed for nausea or vomiting. 05/15/20   Davonna Belling, MD  predniSONE (DELTASONE) 10 MG tablet Take 2 tablets (20 mg total) by mouth daily. 04/11/20   Avegno, Darrelyn Hillock, FNP  traZODone (DESYREL) 50 MG tablet Take 1 tablet (50 mg total) by mouth at bedtime. 12/16/19   Caren Macadam, MD  dicyclomine (BENTYL) 10 MG capsule Take 1 capsule (10 mg total) by mouth 3 (three) times daily before meals. 03/29/18 09/14/19  Zehr, Laban Emperor, PA-C  Norgestimate-Eth Estradiol (SPRINTEC 28 PO) Take by mouth.  09/14/19  [provider]    Current Facility-Administered Medications  Medication Dose Route Frequency Provider Last Rate Last Admin  . acetaminophen (TYLENOL) tablet 650  mg  650 mg Oral Q6H PRN Rise Patience, MD       Or  . acetaminophen (TYLENOL) suppository 650 mg  650 mg Rectal Q6H PRN Rise Patience, MD      . cetirizine HCl (Zyrtec) 5 MG/5ML solution 5 mg  5 mg Oral Daily Swayze, Ava, DO      . diphenhydrAMINE (BENADRYL) injection 25 mg  25 mg Intravenous Q6H PRN Swayze, Ava, DO      . heparin injection 5,000 Units  5,000 Units Subcutaneous Q8H Gean Birchwood N, MD      . HYDROmorphone (DILAUDID) injection 0.5 mg  0.5 mg Intravenous Q2H PRN Rise Patience, MD   0.5 mg at 05/19/20 1015  . lactated ringers infusion   Intravenous Continuous Rise Patience, MD 150 mL/hr at 05/19/20 1517 New Bag at 05/19/20 6160    Allergies as of 05/18/2020 - Review Complete 05/18/2020  Allergen Reaction Noted  . Other Other (See Comments) 02/11/2019    Family History  Problem Relation Age of Onset  . COPD Mother   . Heart disease Mother        smoker  . Hernia Mother   . Diverticulitis Mother   . Kidney disease Mother   . Heart failure Mother   . Stroke Mother 64  . Osteoporosis Mother   . Anxiety disorder Mother        ? on xanax  . Rheum arthritis Mother   . Heart disease Maternal Grandmother        smoker  . Arthritis-Osteo Maternal Grandmother   . Cancer Maternal Grandfather        throat  . Alcohol abuse Maternal Grandfather   . Other Father        pituitary gland tumor  . Depression Sister   . Anxiety disorder Sister   . Colon cancer Neg Hx  Social History   Socioeconomic History  . Marital status: Married    Spouse name: Not on file  . Number of children: 2  . Years of education: Not on file  . Highest education level: Not on file  Occupational History  . Occupation: stay at home mother  Tobacco Use  . Smoking status: Never Smoker  . Smokeless tobacco: Former Systems developer    Types: Secondary school teacher  . Vaping Use: Never used  Substance and Sexual Activity  . Alcohol use: Yes    Comment: occasionally  . Drug  use: No  . Sexual activity: Yes    Birth control/protection: None  Other Topics Concern  . Not on file  Social History Narrative  . Not on file   Social Determinants of Health   Financial Resource Strain: Not on file  Food Insecurity: Not on file  Transportation Needs: Not on file  Physical Activity: Not on file  Stress: Not on file  Social Connections: Not on file  Intimate Partner Violence: Not on file    Review of Systems: All systems reviewed and negative except where noted in HPI.  OBJECTIVE:    Physical Exam: Vital signs in last 24 hours: Temp:  [97.9 F (36.6 C)-98.3 F (36.8 C)] 97.9 F (36.6 C) (01/01 0903) Pulse Rate:  [64-75] 69 (01/01 0903) Resp:  [16-18] 18 (01/01 0903) BP: (115-131)/(77-92) 115/77 (01/01 0903) SpO2:  [97 %-100 %] 100 % (01/01 0903) Weight:  [70.3 kg-71.4 kg] 71.4 kg (01/01 0347)   General:   Alert  female in NAD Psych:  Pleasant, cooperative. Normal mood and affect. Eyes:  Pupils equal, sclera clear, no icterus.   Conjunctiva pink. Ears:  Normal auditory acuity. Nose:  No deformity, discharge,  or lesions. Neck:  Supple; no masses Lungs:  Clear throughout to auscultation.   No wheezes, crackles, or rhonchi.  Heart:  Regular rate and rhythm; no murmurs, no lower extremity edema Abdomen:  Soft, non-distended, mild LUQ tenderness, BS active, no palp mass   Rectal:  Deferred  Msk:  Symmetrical without gross deformities. . Neurologic:  Alert and  oriented x4;  grossly normal neurologically. Skin:  Intact without significant lesions or rashes.  Filed Weights   05/18/20 1648 05/19/20 0903  Weight: 70.3 kg 71.4 kg     Scheduled inpatient medications . cetirizine HCl  5 mg Oral Daily  . heparin  5,000 Units Subcutaneous Q8H      Intake/Output from previous day: 12/31 0701 - 01/01 0700 In: 1000 [I.V.:1000] Out: -  Intake/Output this shift: No intake/output data recorded.   Lab Results: Recent Labs    05/18/20 1704  05/19/20 0619  WBC 8.0 7.6  HGB 12.9 12.0  HCT 38.3 35.6*  PLT 297 270   BMET Recent Labs    05/18/20 1704 05/19/20 0619  NA 136 136  K 4.7 4.2  CL 99 103  CO2 30 23  GLUCOSE 102* 90  BUN <5* <5*  CREATININE 0.75 0.63  CALCIUM 9.6 8.7*   LFT Recent Labs    05/19/20 0619  PROT 5.8*  ALBUMIN 3.5  AST 18  ALT 19  ALKPHOS 27*  BILITOT 0.6  BILIDIR 0.2  IBILI 0.4   PT/INR No results for input(s): LABPROT, INR in the last 72 hours. Hepatitis Panel No results for input(s): HEPBSAG, HCVAB, HEPAIGM, HEPBIGM in the last 72 hours.   . CBC Latest Ref Rng & Units 05/19/2020 05/18/2020 05/14/2020  WBC 4.0 - 10.5 K/uL 7.6 8.0  7.9  Hemoglobin 12.0 - 15.0 g/dL 12.0 12.9 12.9  Hematocrit 36.0 - 46.0 % 35.6(L) 38.3 39.6  Platelets 150 - 400 K/uL 270 297 337    . CMP Latest Ref Rng & Units 05/19/2020 05/18/2020 05/14/2020  Glucose 70 - 99 mg/dL 90 102(H) 106(H)  BUN 6 - 20 mg/dL <5(L) <5(L) 5(L)  Creatinine 0.44 - 1.00 mg/dL 0.63 0.75 0.71  Sodium 135 - 145 mmol/L 136 136 135  Potassium 3.5 - 5.1 mmol/L 4.2 4.7 3.9  Chloride 98 - 111 mmol/L 103 99 99  CO2 22 - 32 mmol/L 23 30 26   Calcium 8.9 - 10.3 mg/dL 8.7(L) 9.6 8.9  Total Protein 6.5 - 8.1 g/dL 5.8(L) 6.5 7.0  Total Bilirubin 0.3 - 1.2 mg/dL 0.6 0.5 0.2(L)  Alkaline Phos 38 - 126 U/L 27(L) 30(L) 28(L)  AST 15 - 41 U/L 18 19 21   ALT 0 - 44 U/L 19 25 23    Studies/Results: MR ABDOMEN WITH MRCP W CONTRAST  Result Date: 05/19/2020 CLINICAL DATA:  35 year old female with history of severe acute pancreatitis. History of Crohn's disease in remission. Epigastric pain radiating to the back and left side. EXAM: MRI ABDOMEN WITH CONTRAST (WITH MRCP) TECHNIQUE: Multiplanar multisequence MR imaging of the abdomen was performed following the administration of intravenous contrast. Heavily T2-weighted images of the biliary and pancreatic ducts were obtained, and three-dimensional MRCP images were rendered by post processing. CONTRAST:   1m GADAVIST GADOBUTROL 1 MMOL/ML IV SOLN COMPARISON:  Abdominal MRI 03/11/2018. FINDINGS: Lower chest: Unremarkable. Hepatobiliary: Diffuse loss of signal intensity throughout the hepatic parenchyma on in phase dual echo images, as well as diffuse low signal intensity on T2 weighted images, indicative of hepatic iron deposition. This is slightly heterogeneous, with more focal iron deposition in segment 6 of the liver. No discrete cystic or solid hepatic lesions are otherwise noted. No intra or extrahepatic biliary ductal dilatation. Gallbladder is normal in appearance. Pancreas: No pancreatic mass. No pancreatic ductal dilatation. Small amount of peripancreatic T2 signal intensity indicates inflammation and trace amount of peripancreatic fluid. No well-defined peripancreatic fluid collection to suggest pseudocyst. Pancreatic parenchyma enhances normally without definitive regions of pancreatic necrosis. Spleen: Mild diffuse loss of signal intensity throughout the splenic parenchyma on in phase dual echo images, and mild T2 hypointensity, indicative of mild splenic iron deposition. Adrenals/Urinary Tract: Bilateral kidneys and adrenal glands are normal in appearance. No hydroureteronephrosis in the visualized portions of the abdomen. Stomach/Bowel: Visualized portions are unremarkable. Vascular/Lymphatic: No aneurysm identified in the visualized abdominal vasculature. No lymphadenopathy noted in the abdomen. Other: No significant volume of ascites noted in the visualized portions of the peritoneal cavity. Musculoskeletal: No aggressive appearing osseous lesions are noted in the visualized portions of the skeleton. IMPRESSION: 1. Subtle inflammatory changes around the pancreas compatible with reported clinical history of pancreatitis. No evidence of pancreatic necrosis, and no pancreatic pseudocyst or other acute complicating features noted at this time. 2. Interval development of both hepatic and splenic iron  deposition, as above. 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 Correlation with clinical history of blood transfusions or other cause of secondary hemochromatosis is recommended. Electronically Signed   By: DVinnie LangtonM.D.   On: 05/19/2020 10:07    Principal Problem:   Acute pancreatitis    PTye Savoy NP-C @  05/19/2020, 11:45 AM   Attending physician's note   I have taken an interval history, reviewed the chart and examined the patient. I agree with the Advanced Practitioner's note, impression and recommendations.  Acute pancreatitis. ?etiology. No ETOH, gallstones, abn LFTs, trauma, FH, cocaine use, use of meds assoc with pancreatitis. Nl TG, Ca. No previous H/O pancreatitis. Neg MRCP for any ductal dilatation or congenital abn like divisum.  H/O mild Crohn's colitis, Dx 2008, off Rx. Last colon 08/2015 with mild ileal colitis.  MRCP suggesting secondary hemochromatosis d/t iron infusions for IDA (menorrhagia/some rectal bleeding)   Plan: -IVF/pain control/treat nausea/supportive treatment for now. -Check IgG4 to r/o autoimmune pancreatitis.  Check ANCA to r/o PSC -Obtain celiac screen and iron studies. -Trend CBC, lipase. -Stop all alcohol. -Would need Crohn's reeval as outpt -Will follow along.   Carmell Austria, MD [facility] [phone]

## 2020-05-19 NOTE — ED Provider Notes (Signed)
TIME SEEN: 3:59 AM  CHIEF COMPLAINT: Abdominal pain, nausea  HPI: Patient is a 35 year old female with history of Crohn's disease, previous C-section who presents to the emergency department with increasing upper abdominal pain.  Was diagnosed with pancreatitis on 05/15/2020.  No history of heavy alcohol use.  States that she was discharged home with pain medication.  Has been taking her medication but it is no longer controlling her pain.  She is not vomiting.  No fever.  She is having some discomfort in the center of her chest as well.  ROS: See HPI Constitutional: no fever  Eyes: no drainage  ENT: no runny nose   Cardiovascular:  chest pain  Resp: no SOB  GI: no vomiting GU: no dysuria Integumentary: no rash  Allergy: no hives  Musculoskeletal: no leg swelling  Neurological: no slurred speech ROS otherwise negative  PAST MEDICAL HISTORY/PAST SURGICAL HISTORY:  Past Medical History:  Diagnosis Date  . COVID-19   . Crohn's colitis ([place])   . Depression   . GERD (gastroesophageal reflux disease)    no meds  . Normal labor 03/27/2013  . Postpartum care following cesarean delivery (11/9) 03/27/2013    MEDICATIONS:  Prior to Admission medications   Medication Sig Start Date End Date Taking? Authorizing Provider  albuterol (VENTOLIN HFA) 108 (90 Base) MCG/ACT inhaler Inhale 1-2 puffs into the lungs every 4 (four) hours as needed for wheezing or shortness of breath. 08/26/19   Jaynee Eagles, PA-C  budesonide-formoterol (SYMBICORT) 80-4.5 MCG/ACT inhaler Inhale 2 puffs into the lungs 2 (two) times a day.    [provider]  buPROPion (WELLBUTRIN SR) 150 MG 12 hr tablet TAKE 1 TABLET BY MOUTH TWICE A DAY 03/16/20   Koberlein, Andris Flurry C, MD  cetirizine (ZYRTEC) 10 MG tablet Take 10 mg by mouth daily.    [provider]  cetirizine-pseudoephedrine (ZYRTEC-D) 5-120 MG tablet Take 1 tablet by mouth daily. 04/11/20   Avegno, Darrelyn Hillock, FNP  diphenhydrAMINE (BENADRYL) 25 mg  capsule Take 25 mg by mouth at bedtime as needed for allergies. For itching    [provider]  escitalopram (LEXAPRO) 10 MG tablet TAKE 1 TABLET (10 MG TOTAL) BY MOUTH IN THE MORNING AND AT BEDTIME. 03/06/20   Koberlein, Steele Berg, MD  HYDROcodone-acetaminophen (NORCO/VICODIN) 5-325 MG tablet Take 1-2 tablets by mouth every 6 (six) hours as needed. 05/15/20   Davonna Belling, MD  hydrocortisone 2.5 % cream Apply 1 application topically daily as needed (for itching-irritation).  12/05/19   [provider]  montelukast (SINGULAIR) 10 MG tablet Take 10 mg by mouth at bedtime.    [provider]  ofloxacin (OCUFLOX) 0.3 % ophthalmic solution Place 1 drop into the right eye 4 (four) times daily. 12/26/19   Avegno, Darrelyn Hillock, FNP  Olopatadine HCl 0.2 % SOLN Apply 1 drop to eye daily as needed. 12/26/19   Avegno, Darrelyn Hillock, FNP  ondansetron (ZOFRAN-ODT) 4 MG disintegrating tablet Take 1 tablet (4 mg total) by mouth every 8 (eight) hours as needed for nausea or vomiting. 05/15/20   Davonna Belling, MD  predniSONE (DELTASONE) 10 MG tablet Take 2 tablets (20 mg total) by mouth daily. 04/11/20   Avegno, Darrelyn Hillock, FNP  traZODone (DESYREL) 50 MG tablet Take 1 tablet (50 mg total) by mouth at bedtime. 12/16/19   Caren Macadam, MD  dicyclomine (BENTYL) 10 MG capsule Take 1 capsule (10 mg total) by mouth 3 (three) times daily before meals. 03/29/18 09/14/19  Alonza Bogus  D, PA-C  Norgestimate-Eth Estradiol (SPRINTEC 28 PO) Take by mouth.  09/14/19  [provider]    ALLERGIES:  Allergies  Allergen Reactions  . Other Other (See Comments)    Mold, mildew, grass, fungus, trees cause swelling of eyes    SOCIAL HISTORY:  Social History   Tobacco Use  . Smoking status: Never Smoker  . Smokeless tobacco: Former Systems developer    Types: Chew  Substance Use Topics  . Alcohol use: Yes    Comment: occasionally    FAMILY HISTORY: Family History  Problem Relation Age of Onset   . COPD Mother   . Heart disease Mother        smoker  . Hernia Mother   . Diverticulitis Mother   . Kidney disease Mother   . Heart failure Mother   . Stroke Mother 33  . Osteoporosis Mother   . Anxiety disorder Mother        ? on xanax  . Rheum arthritis Mother   . Heart disease Maternal Grandmother        smoker  . Arthritis-Osteo Maternal Grandmother   . Cancer Maternal Grandfather        throat  . Alcohol abuse Maternal Grandfather   . Other Father        pituitary gland tumor  . Depression Sister   . Anxiety disorder Sister   . Colon cancer Neg Hx     EXAM: BP 131/89   Pulse 75   Temp 98.3 F (36.8 C) (Oral)   Resp 18   Ht 5' 2"  (1.575 m)   Wt 70.3 kg   LMP 04/24/2020 (Exact Date) Comment: neg preg test  SpO2 100%   BMI 28.35 kg/m  CONSTITUTIONAL: Alert and oriented and responds appropriately to questions.  Appears uncomfortable but not in distress, afebrile HEAD: Normocephalic EYES: Conjunctivae clear, pupils appear equal, EOM appear intact ENT: normal nose; moist mucous membranes NECK: Supple, normal ROM CARD: RRR; S1 and S2 appreciated; no murmurs, no clicks, no rubs, no gallops RESP: Normal chest excursion without splinting or tachypnea; breath sounds clear and equal bilaterally; no wheezes, no rhonchi, no rales, no hypoxia or respiratory distress, speaking full sentences ABD/GI: Normal bowel sounds; non-distended; soft, ender to palpation in the epigastric region, no rebound, no guarding, no peritoneal signs, no hepatosplenomegaly BACK:  The back appears normal EXT: Normal ROM in all joints; no deformity noted, no edema; no cyanosis SKIN: Normal color for age and race; warm; no rash on exposed skin NEURO: Moves all extremities equally PSYCH: The patient's mood and manner are appropriate.   MEDICAL DECISION MAKING: Patient here with epigastric abdominal pain, atypical chest pain in the setting of recent diagnosis of pancreatitis based on elevated lipase  and a CT scan which showed acute mild pancreatitis and mild pancreatic ductal dilatation on 05/15/2020.  Symptoms worsening despite oral medicines at home.  Will give IV fluids, IV pain and nausea medication.  Have offered admission and patient agrees.  Will repeat EKG but chest pain seems very atypical.  Will discuss with medicine for admission.  Lipase again elevated today.  I do not feel she needs repeat abdominal imaging at this time.  ED PROGRESS: 5:12 AM Discussed patient's case with hospitalist, Dr. Hal Hope.  I have recommended admission and patient (and family if present) agree with this plan. Admitting physician will place admission orders.   I reviewed all nursing notes, vitals, pertinent previous records and reviewed/interpreted all EKGs, lab and urine results, imaging (  as available).   EKG shows no ischemic abnormality.  Urine does show moderate leukocytes and 21-50 white blood cells and many bacteria but there are also multiple squamous cells.  She is not having urinary symptoms.  Suspect contaminated sample.    EKG Interpretation  Date/Time:  Saturday May 19 2020 05:49:31 EST Ventricular Rate:  61 PR Interval:  142 QRS Duration: 88 QT Interval:  404 QTC Calculation: 406 R Axis:   65 Text Interpretation: Normal sinus rhythm Normal ECG Confirmed by Pryor Curia [phone]) on 05/19/2020 6:03:31 AM           Denny Peon Smart was evaluated in Emergency Department on 05/19/2020 for the symptoms described in the history of present illness. She was evaluated in the context of the global COVID-19 pandemic, which necessitated consideration that the patient might be at risk for infection with the SARS-CoV-2 virus that causes COVID-19. Institutional protocols and algorithms that pertain to the evaluation of patients at risk for COVID-19 are in a state of rapid change based on information released by regulatory bodies including the CDC and federal and state organizations. These  policies and algorithms were followed during the patient's care in the ED.      Rogelio Winbush, Delice Bison, DO 05/19/20 [phone]

## 2020-05-20 DIAGNOSIS — D500 Iron deficiency anemia secondary to blood loss (chronic): Secondary | ICD-10-CM | POA: Diagnosis not present

## 2020-05-20 DIAGNOSIS — E162 Hypoglycemia, unspecified: Secondary | ICD-10-CM

## 2020-05-20 DIAGNOSIS — K50111 Crohn's disease of large intestine with rectal bleeding: Secondary | ICD-10-CM | POA: Diagnosis not present

## 2020-05-20 DIAGNOSIS — K8500 Idiopathic acute pancreatitis without necrosis or infection: Secondary | ICD-10-CM | POA: Diagnosis not present

## 2020-05-20 LAB — GLUCOSE, CAPILLARY
Glucose-Capillary: 103 mg/dL — ABNORMAL HIGH (ref 70–99)
Glucose-Capillary: 98 mg/dL (ref 70–99)

## 2020-05-20 LAB — COMPREHENSIVE METABOLIC PANEL
ALT: 21 U/L (ref 0–44)
AST: 16 U/L (ref 15–41)
Albumin: 3.3 g/dL — ABNORMAL LOW (ref 3.5–5.0)
Alkaline Phosphatase: 21 U/L — ABNORMAL LOW (ref 38–126)
Anion gap: 7 (ref 5–15)
BUN: <5 mg/dL — ABNORMAL LOW (ref 6–20)
CO2: 27 mmol/L (ref 22–32)
Calcium: 8.9 mg/dL (ref 8.9–10.3)
Chloride: 101 mmol/L (ref 98–111)
Creatinine, Ser: 0.71 mg/dL (ref 0.44–1.00)
GFR, Estimated: >60 mL/min (ref >60)
Glucose, Bld: 100 mg/dL — ABNORMAL HIGH (ref 70–99)
Potassium: 4.2 mmol/L (ref 3.5–5.1)
Sodium: 135 mmol/L (ref 135–145)
Total Bilirubin: 0.5 mg/dL (ref 0.3–1.2)
Total Protein: 5.6 g/dL — ABNORMAL LOW (ref 6.5–8.1)

## 2020-05-20 LAB — CBC WITH DIFFERENTIAL/PLATELET
Abs Immature Granulocytes: 0.02 10*3/uL (ref 0.00–0.07)
Basophils Absolute: 0 10*3/uL (ref 0.0–0.1)
Basophils Relative: 1 %
Eosinophils Absolute: 0.2 10*3/uL (ref 0.0–0.5)
Eosinophils Relative: 4 %
HCT: 35.7 % — ABNORMAL LOW (ref 36.0–46.0)
Hemoglobin: 11.5 g/dL — ABNORMAL LOW (ref 12.0–15.0)
Immature Granulocytes: 0 %
Lymphocytes Relative: 30 %
Lymphs Abs: 1.4 10*3/uL (ref 0.7–4.0)
MCH: 28.9 pg (ref 26.0–34.0)
MCHC: 32.2 g/dL (ref 30.0–36.0)
MCV: 89.7 fL (ref 80.0–100.0)
Monocytes Absolute: 0.5 10*3/uL (ref 0.1–1.0)
Monocytes Relative: 11 %
Neutro Abs: 2.7 10*3/uL (ref 1.7–7.7)
Neutrophils Relative %: 54 %
Platelets: 241 10*3/uL (ref 150–400)
RBC: 3.98 MIL/uL (ref 3.87–5.11)
RDW: 13.2 % (ref 11.5–15.5)
WBC: 4.9 10*3/uL (ref 4.0–10.5)
nRBC: 0 % (ref 0.0–0.2)

## 2020-05-20 LAB — LIPASE, BLOOD: Lipase: 87 U/L — ABNORMAL HIGH (ref 11–51)

## 2020-05-20 MED ORDER — DEXTROSE-NACL 5-0.45 % IV SOLN: INTRAVENOUS | Status: AC

## 2020-05-20 MED ORDER — OXYCODONE HCL 5 MG PO TABS: 5.0000 mg | ORAL_TABLET | ORAL | Status: DC | PRN

## 2020-05-20 MED ORDER — DIPHENHYDRAMINE HCL 25 MG PO CAPS
25.0000 mg | ORAL_CAPSULE | Freq: Four times a day (QID) | ORAL | Status: DC | PRN
  Administered 2020-05-20 – 2020-05-23 (×6): 25 mg via ORAL
  Filled 2020-05-20 (×6): qty 1

## 2020-05-20 MED ORDER — SENNOSIDES-DOCUSATE SODIUM 8.6-50 MG PO TABS
1.0000 | ORAL_TABLET | Freq: Two times a day (BID) | ORAL | Status: DC
  Administered 2020-05-20 – 2020-05-23 (×8): 1 via ORAL
  Filled 2020-05-20 (×8): qty 1

## 2020-05-20 MED ORDER — FAMOTIDINE 20 MG PO TABS
20.0000 mg | ORAL_TABLET | Freq: Two times a day (BID) | ORAL | Status: DC
  Administered 2020-05-20 – 2020-05-24 (×9): 20 mg via ORAL
  Filled 2020-05-20 (×9): qty 1

## 2020-05-20 MED ORDER — SIMETHICONE 80 MG PO CHEW
80.0000 mg | CHEWABLE_TABLET | Freq: Four times a day (QID) | ORAL | Status: DC | PRN
  Administered 2020-05-20 – 2020-05-22 (×4): 80 mg via ORAL
  Filled 2020-05-20 (×5): qty 1

## 2020-05-20 MED ORDER — OXYCODONE HCL 5 MG PO TABS
5.0000 mg | ORAL_TABLET | ORAL | Status: DC | PRN
  Administered 2020-05-20 – 2020-05-21 (×4): 5 mg via ORAL
  Filled 2020-05-20 (×5): qty 1

## 2020-05-20 NOTE — Progress Notes (Addendum)
[facility]  PROGRESS NOTE  Sonia [name] KTG:[id number] DOB: Nov 18, 1985 DOA: 05/18/2020 PCP: Caren Macadam, MD Admit date - 05/18/2020   Admitting Physician Rise Patience, MD  Outpatient Primary MD for the patient is Caren Macadam, MD  LOS - 1 Brief Narrative   Sonia [name] is a 35 y.o. year old female with medical history significant for Crohn's colitis currently on no maintenance therapy, iron deficiency anemia who presented on 05/18/2020 with abdominal pain and was found to have acute pancreatitis of unclear etiology.  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  No nausea or vomiting.  Interested in trialing diet A & P  Acute pancreatitis of unclear etiology with no associated pancreatic necrosis or fluid collection on CT imaging and unremarkable MRCP, lipase 87 (peak of 2 67), afebrile, no leukocytosis.  Slowly improving still having pain requiring intermittent use of IV dilaudid for improvement but ready to trial diet -Start clear liquids, monitor and advance as tolerated -Continue pain control with as needed IV Dilaudid every 2 hours for severe pain, adding OxyIR for as needed moderate pain -As needed gas/antacid control -Continue fluid support with D5 half-normal until able to tolerate p.o. consistently  Hypoglycemia in setting of diminished oral intake secondary to above.  Nadir glucose of 64 prompted addition of D5 to half-normal saline. -Continue D5 half-normal saline, dissipate been able to discontinue was able to consistently tolerate p.o. 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  MRCP showing interval development of hepatic/splenic iron deposition likely from recent iron infusions for symptomatic iron deficiency -TTG, IgA pending  Chronic iron deficiency anemia.  Hemoglobin stable.  Last blood  transfusion with iron infusions a couple months ago.  Suspect secondary to menses as well as rectal bleeding related to mild Crohn's colitis. -Monitor CBC  Chronic constipation.  No BM also minimal appetite -Senna docusate, monitor  Mild Crohn's colitis (diagnosed 2008).  Reports intermittent rectal bleeding with BMs -On no maintenance medications, outpatient GI follow-up     Family Communication  : None at bedside  Code Status : Full  Disposition Plan  :  Patient is from home. Anticipated d/c date:  1-2 days. Barriers to d/c or necessity for inpatient status:  If able to tolerate advancement in diet Consults  : GI  Procedures  : MRCP  DVT Prophylaxis  : Heparin MDM: The below labs and imaging reports were reviewed and summarized above.  Medication management as above.  Lab Results  Component Value Date   PLT 241 05/20/2020    Diet :  Diet Order            Diet Heart Room service appropriate? Yes; Fluid consistency: Thin  Diet effective 1400                  Inpatient Medications Scheduled Meds: . cetirizine HCl  5 mg Oral Daily  . famotidine  20 mg Oral BID  . heparin  5,000 Units Subcutaneous Q8H  . senna-docusate  1 tablet Oral BID   Continuous Infusions: . dextrose 5 % and 0.45% NaCl 125 mL/hr at 05/20/20 0931   PRN Meds:.acetaminophen **OR** acetaminophen, diphenhydrAMINE, HYDROmorphone (DILAUDID) injection, oxyCODONE, simethicone  Antibiotics  :   Anti-infectives (From admission, onward)   None       Objective   Vitals:   05/19/20 1705 05/19/20 2132 05/20/20 0644 05/20/20 1124  BP: 116/75 Marland Kitchen)  101/56 109/72 (!) 116/91  Pulse: 72 67 66 64  Resp: 18 18 18 14   Temp: 97.8 F (36.6 C) 98.8 F (37.1 C) 98.7 F (37.1 C) 97.8 F (36.6 C)  TempSrc: Oral Oral Oral   SpO2: 98% 97% 98% 100%  Weight:  71.4 kg    Height:        SpO2: 100 %  Wt Readings from Last 3 Encounters:  05/19/20 71.4 kg  05/14/20 71.4 kg  05/14/20 68 kg      Intake/Output Summary (Last 24 hours) at 05/20/2020 1550 Last data filed at 05/20/2020 2637 Gross per 24 hour  Intake 2166.17 ml  Output 0 ml  Net 2166.17 ml    Physical Exam:     Awake Alert, Oriented X 3, Normal affect No new F.N deficits,  [place].AT, Normal respiratory effort on room air, CTAB RRR,No Gallops,Rubs or new Murmurs,  Bowel sounds abd Soft, mild tenderness in epigastric area, No rebound, guarding or rigidity. No Cyanosis, No new Rash or bruise     I have personally reviewed the following:   Data Reviewed:  CBC Recent Labs  Lab 05/14/20 1845 05/18/20 1704 05/19/20 0619 05/20/20 0430  WBC 7.9 8.0 7.6 4.9  HGB 12.9 12.9 12.0 11.5*  HCT 39.6 38.3 35.6* 35.7*  PLT 337 297 270 241  MCV 90.0 89.1 89.2 89.7  MCH 29.3 30.0 30.1 28.9  MCHC 32.6 33.7 33.7 32.2  RDW 14.3 13.7 13.5 13.2  LYMPHSABS  --   --  1.3 1.4  MONOABS  --   --  0.6 0.5  EOSABS  --   --  0.2 0.2  BASOSABS  --   --  0.0 0.0    Chemistries  Recent Labs  Lab 05/14/20 1845 05/18/20 1704 05/19/20 0619 05/20/20 0430  NA 135 136 136 135  K 3.9 4.7 4.2 4.2  CL 99 99 103 101  CO2 26 30 23 27   GLUCOSE 106* 102* 90 100*  BUN 5* <5* <5* <5*  CREATININE 0.71 0.75 0.63 0.71  CALCIUM 8.9 9.6 8.7* 8.9  AST 21 19 18 16   ALT 23 25 19 21   ALKPHOS 28* 30* 27* 21*  BILITOT 0.2* 0.5 0.6 0.5   ------------------------------------------------------------------------------------------------------------------ Recent Labs    05/19/20 0619  TRIG 93    No results found for: HGBA1C ------------------------------------------------------------------------------------------------------------------ No results for input(s): TSH, T4TOTAL, T3FREE, THYROIDAB in the last 72 hours.  Invalid input(s): FREET3 ------------------------------------------------------------------------------------------------------------------ No results for input(s): VITAMINB12, FOLATE, FERRITIN, TIBC, IRON, RETICCTPCT in the  last 72 hours.  Coagulation profile No results for input(s): INR, PROTIME in the last 168 hours.  No results for input(s): DDIMER in the last 72 hours.  Cardiac Enzymes No results for input(s): CKMB, TROPONINI, MYOGLOBIN in the last 168 hours.  Invalid input(s): CK ------------------------------------------------------------------------------------------------------------------ No results found for: BNP  Micro Results Recent Results (from the past 240 hour(s))  SARS CORONAVIRUS 2 (TAT 6-24 HRS) Nasopharyngeal Nasopharyngeal Swab     Status: None   Collection Time: 05/19/20  6:11 AM   Specimen: Nasopharyngeal Swab  Result Value Ref Range Status   SARS Coronavirus 2 NEGATIVE NEGATIVE Final    Comment: (NOTE) SARS-CoV-2 target nucleic acids are NOT DETECTED.  The SARS-CoV-2 RNA is generally detectable in upper and lower respiratory specimens during the acute phase of infection. Negative results do not preclude SARS-CoV-2 infection, do not rule out co-infections with other pathogens, and should not be used as the sole basis for treatment or other patient management  decisions. Negative results must be combined with clinical observations, patient history, and epidemiological information. The expected result is Negative.  Fact Sheet for Patients: SugarRoll.be  Fact Sheet for Healthcare Providers: https://www.woods-mathews.com/  This test is not yet approved or cleared by the [place] FDA and  has been authorized for detection and/or diagnosis of SARS-CoV-2 by FDA under an Emergency Use Authorization (EUA). This EUA will remain  in effect (meaning this test can be used) for the duration of the COVID-19 declaration under Se ction 564(b)(1) of the Act, 21 U.S.C. section 360bbb-3(b)(1), unless the authorization is terminated or revoked sooner.  Performed at [facility], [place] [address], [place], [place] [address]      Radiology Reports CT ABDOMEN PELVIS W CONTRAST  Result Date: 05/15/2020 CLINICAL DATA:  Upper abdominal pain EXAM: CT ABDOMEN AND PELVIS WITH CONTRAST TECHNIQUE: Multidetector CT imaging of the abdomen and pelvis was performed using the standard protocol following bolus administration of intravenous contrast. CONTRAST:  167m OMNIPAQUE IOHEXOL 300 MG/ML  SOLN COMPARISON:  None. FINDINGS: Lower chest: The visualized heart size within normal limits. No pericardial fluid/thickening. No hiatal hernia. The visualized portions of the lungs are clear. Hepatobiliary: The liver is normal in density without focal abnormality.The main portal vein is patent. No evidence of calcified gallstones, gallbladder wall thickening or biliary dilatation. Pancreas: There is minimal fat stranding changes seen around the pancreatic head and proximal body with mild pancreatic ductal dilatation measuring up to 4 mm. No loculated fluid collections or free air is seen. Spleen: Normal in size without focal abnormality. Adrenals/Urinary Tract: Both adrenal glands appear normal. The kidneys and collecting system appear normal without evidence of urinary tract calculus or hydronephrosis. Bladder is unremarkable. Stomach/Bowel: The stomach, small bowel, and colon are normal in appearance. No inflammatory changes, wall thickening, or obstructive findings.The appendix is normal. Vascular/Lymphatic: There are no enlarged mesenteric, retroperitoneal, or pelvic lymph nodes. No significant vascular findings are present. Reproductive: There is a 3.2 cm low-density lesion within the left adnexa, likely ovarian cyst. A small amount of fluid seen within the endometrial canal. Other: No evidence of abdominal wall mass or hernia. Musculoskeletal: No acute or significant osseous findings. IMPRESSION: Findings which may be suggestive of acute mild pancreatitis and mild pancreatic ductal dilatation. No loculated fluid collection or evidence of pancreatic  necrosis. Electronically Signed   By: BPrudencio PairM.D.   On: 05/15/2020 00:49   MR ABDOMEN WITH MRCP W CONTRAST  Result Date: 05/19/2020 CLINICAL DATA:  35year old female with history of severe acute pancreatitis. History of Crohn's disease in remission. Epigastric pain radiating to the back and left side. EXAM: MRI ABDOMEN WITH CONTRAST (WITH MRCP) TECHNIQUE: Multiplanar multisequence MR imaging of the abdomen was performed following the administration of intravenous contrast. Heavily T2-weighted images of the biliary and pancreatic ducts were obtained, and three-dimensional MRCP images were rendered by post processing. CONTRAST:  760mGADAVIST GADOBUTROL 1 MMOL/ML IV SOLN COMPARISON:  Abdominal MRI 03/11/2018. FINDINGS: Lower chest: Unremarkable. Hepatobiliary: Diffuse loss of signal intensity throughout the hepatic parenchyma on in phase dual echo images, as well as diffuse low signal intensity on T2 weighted images, indicative of hepatic iron deposition. This is slightly heterogeneous, with more focal iron deposition in segment 6 of the liver. No discrete cystic or solid hepatic lesions are otherwise noted. No intra or extrahepatic biliary ductal dilatation. Gallbladder is normal in appearance. Pancreas: No pancreatic mass. No pancreatic ductal dilatation. Small amount of peripancreatic T2 signal intensity indicates inflammation  and trace amount of peripancreatic fluid. No well-defined peripancreatic fluid collection to suggest pseudocyst. Pancreatic parenchyma enhances normally without definitive regions of pancreatic necrosis. Spleen: Mild diffuse loss of signal intensity throughout the splenic parenchyma on in phase dual echo images, and mild T2 hypointensity, indicative of mild splenic iron deposition. Adrenals/Urinary Tract: Bilateral kidneys and adrenal glands are normal in appearance. No hydroureteronephrosis in the visualized portions of the abdomen. Stomach/Bowel: Visualized portions are  unremarkable. Vascular/Lymphatic: No aneurysm identified in the visualized abdominal vasculature. No lymphadenopathy noted in the abdomen. Other: No significant volume of ascites noted in the visualized portions of the peritoneal cavity. Musculoskeletal: No aggressive appearing osseous lesions are noted in the visualized portions of the skeleton. IMPRESSION: 1. Subtle inflammatory changes around the pancreas compatible with reported clinical history of pancreatitis. No evidence of pancreatic necrosis, and no pancreatic pseudocyst or other acute complicating features noted at this time. 2. Interval development of both hepatic and splenic iron deposition, as above. 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 Correlation with clinical history of blood transfusions or other cause of secondary hemochromatosis is recommended. Electronically Signed   By: Vinnie Langton M.D.   On: 05/19/2020 10:07     Time Spent in minutes  30     Desiree Hane M.D on 05/20/2020 at 3:50 PM  To page go to [email or web address] - password [facility]

## 2020-05-20 NOTE — Progress Notes (Addendum)
Progress Note  Chief Complaint:         ASSESSMENT / PLAN:    # 35 yo female with LUQ pain / back pain / . CT scan 12/28 and MRCP this admission suggests acute pancreatitis. Etiology not determined.  --Still having abdominal pain when Dilaudid wears off. Requiring pain meds Q 2-3 hours. Gas medication and walking this am seems to have helped. Tolerating clear liquids. Maybe advance diet for dinner ( low fat) and possibly home tomorrow if tolerates diet .  --Today, lipase 87. Hct 35.7, renal function normal, afebrile, normal WBC.  --Awaiting IgG4 and ANCA for autoimmune pancreatitis  # Hx of mild Crohn's colitis, diagnosed in 2008 and treated with lialda. For the most part she has been off treatment for years. Office visits have been sporadic. Last seen in office in October 2019, Lialda resumed at that time but she hasn't been taking it. Overall she has been in clinical remission. She was occasionally having rectal bleeding with BMs but over last few weeks this has become more frequent. Bleeding could be hemorrhoidal. No bowel wall abnormalities / active Crohn's on recent CT scan.  --Need to address Crohn's at follow up office visit . Glad she is in clinical remission off medication but that doesn't always equate to endoscopic remission.   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 She actually has a history of symptomatic iron deficiency and in fact received iron infusions a couple of months ago.  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  -- tTg, Iga pending  # Chronic constipation. No BM in a week.  --RN giving Senna + stool softener right now.        SUBJECTIVE:   Still having abdominal pain when pain meds wear off. Ambulated earlier and thinks that was helpful. No BM in a week    OBJECTIVE:    Scheduled inpatient medications:  . cetirizine HCl  5 mg Oral Daily  . heparin  5,000 Units Subcutaneous Q8H  .  senna-docusate  1 tablet Oral BID   Continuous inpatient infusions:  . dextrose 5 % and 0.45% NaCl 125 mL/hr at 05/20/20 0931   PRN inpatient medications: acetaminophen **OR** acetaminophen, diphenhydrAMINE, HYDROmorphone (DILAUDID) injection, oxyCODONE, simethicone  Vital signs in last 24 hours: Temp:  [97.8 F (36.6 C)-98.8 F (37.1 C)] 98.7 F (37.1 C) (01/02 0644) Pulse Rate:  [66-72] 66 (01/02 0644) Resp:  [18] 18 (01/02 0644) BP: (101-118)/(56-78) 109/72 (01/02 0644) SpO2:  [97 %-100 %] 98 % (01/02 0644) Weight:  [71.4 kg] 71.4 kg (01/01 2132) Last BM Date:  (prior to admission)  Intake/Output Summary (Last 24 hours) at 05/20/2020 1117 Last data filed at 05/20/2020 2993 Gross per 24 hour  Intake 2166.17 ml  Output 0 ml  Net 2166.17 ml     Physical Exam:  . General: Alert female in NAD . Heart:  Regular rate and rhythm. No lower extremity edema . Pulmonary: Normal respiratory effort . Abdomen: Soft, nondistended, nontender. Normal bowel sounds.  . Neurologic: Alert and oriented . Psych: Pleasant. Cooperative.   Filed Weights   05/18/20 1648 05/19/20 0903 05/19/20 2132  Weight: 70.3 kg 71.4 kg 71.4 kg    Intake/Output from previous day: 01/01 0701 - 01/02 0700 In: 1686.2 [P.O.:30; I.V.:1656.2] Out: 0  Intake/Output this shift: Total I/O In: 480 [P.O.:480] Out: 0     Lab Results: Recent Labs    05/18/20 1704 05/19/20 0619 05/20/20 0430  WBC 8.0 7.6 4.9  HGB 12.9 12.0 11.5*  HCT 38.3 35.6* 35.7*  PLT 297 270 241   BMET Recent Labs    05/18/20 1704 05/19/20 0619 05/20/20 0430  NA 136 136 135  K 4.7 4.2 4.2  CL 99 103 101  CO2 30 23 27   GLUCOSE 102* 90 100*  BUN <5* <5* <5*  CREATININE 0.75 0.63 0.71  CALCIUM 9.6 8.7* 8.9   LFT Recent Labs    05/19/20 0619 05/20/20 0430  PROT 5.8* 5.6*  ALBUMIN 3.5 3.3*  AST 18 16  ALT 19 21  ALKPHOS 27* 21*  BILITOT 0.6 0.5  BILIDIR 0.2  --   IBILI 0.4  --    PT/INR No results for input(s):  LABPROT, INR in the last 72 hours. Hepatitis Panel No results for input(s): HEPBSAG, HCVAB, HEPAIGM, HEPBIGM in the last 72 hours.  MR ABDOMEN WITH MRCP W CONTRAST  Result Date: 05/19/2020 CLINICAL DATA:  35 year old female with history of severe acute pancreatitis. History of Crohn's disease in remission. Epigastric pain radiating to the back and left side. EXAM: MRI ABDOMEN WITH CONTRAST (WITH MRCP) TECHNIQUE: Multiplanar multisequence MR imaging of the abdomen was performed following the administration of intravenous contrast. Heavily T2-weighted images of the biliary and pancreatic ducts were obtained, and three-dimensional MRCP images were rendered by post processing. CONTRAST:  38m GADAVIST GADOBUTROL 1 MMOL/ML IV SOLN COMPARISON:  Abdominal MRI 03/11/2018. FINDINGS: Lower chest: Unremarkable. Hepatobiliary: Diffuse loss of signal intensity throughout the hepatic parenchyma on in phase dual echo images, as well as diffuse low signal intensity on T2 weighted images, indicative of hepatic iron deposition. This is slightly heterogeneous, with more focal iron deposition in segment 6 of the liver. No discrete cystic or solid hepatic lesions are otherwise noted. No intra or extrahepatic biliary ductal dilatation. Gallbladder is normal in appearance. Pancreas: No pancreatic mass. No pancreatic ductal dilatation. Small amount of peripancreatic T2 signal intensity indicates inflammation and trace amount of peripancreatic fluid. No well-defined peripancreatic fluid collection to suggest pseudocyst. Pancreatic parenchyma enhances normally without definitive regions of pancreatic necrosis. Spleen: Mild diffuse loss of signal intensity throughout the splenic parenchyma on in phase dual echo images, and mild T2 hypointensity, indicative of mild splenic iron deposition. Adrenals/Urinary Tract: Bilateral kidneys and adrenal glands are normal in appearance. No hydroureteronephrosis in the visualized portions of the  abdomen. Stomach/Bowel: Visualized portions are unremarkable. Vascular/Lymphatic: No aneurysm identified in the visualized abdominal vasculature. No lymphadenopathy noted in the abdomen. Other: No significant volume of ascites noted in the visualized portions of the peritoneal cavity. Musculoskeletal: No aggressive appearing osseous lesions are noted in the visualized portions of the skeleton. IMPRESSION: 1. Subtle inflammatory changes around the pancreas compatible with reported clinical history of pancreatitis. No evidence of pancreatic necrosis, and no pancreatic pseudocyst or other acute complicating features noted at this time. 2. Interval development of both hepatic and splenic iron deposition, as above. 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 Correlation with clinical history of blood transfusions or other cause of secondary hemochromatosis is recommended. Electronically Signed   By: DVinnie LangtonM.D.   On: 05/19/2020 10:07      Principal Problem:   Acute pancreatitis     LOS: 1 day   PTye Savoy,NP 05/20/2020, 11:17 AM     Attending physician's note   I have taken an interval history, reviewed the chart and examined the patient. I agree with the Advanced Practitioner's note, impression and recommendations.   Overall better. Still  requiring Dilaudid for abdominal pain. No nausea Minimal epigastric tenderness. Lipase trending down well.  Acute pancreatitis ?Etiology Mild Crohn's colitis Dx 2008 Secondary hemochromatosis  Plan: -Advance diet. -Ambulate -Follow celiac serology, IgG4, ANCA -If she has recurrent attacks, would consider genetic testing/EUS with Bx to r/o type 2 AIP. -We have discussed and decided against empiric steroids at this time. -Stop all alcohol. -Trend CBC, lipase -Would need Crohn's reeval as outpt.  Patient is aware -Dr. Hilarie Fredrickson taking over the GI service tomorrow. -If better, I believe she would be able to go home  tomorrow.    Carmell Austria, MD [facility] [phone]

## 2020-05-21 ENCOUNTER — Telehealth: Payer: Self-pay | Admitting: Nurse Practitioner

## 2020-05-21 ENCOUNTER — Inpatient Hospital Stay (HOSPITAL_COMMUNITY): Payer: BC Managed Care – PPO

## 2020-05-21 DIAGNOSIS — K8500 Idiopathic acute pancreatitis without necrosis or infection: Secondary | ICD-10-CM | POA: Diagnosis not present

## 2020-05-21 DIAGNOSIS — E162 Hypoglycemia, unspecified: Secondary | ICD-10-CM

## 2020-05-21 DIAGNOSIS — D500 Iron deficiency anemia secondary to blood loss (chronic): Secondary | ICD-10-CM

## 2020-05-21 DIAGNOSIS — K50111 Crohn's disease of large intestine with rectal bleeding: Secondary | ICD-10-CM

## 2020-05-21 LAB — CBC
HCT: 36.5 % (ref 36.0–46.0)
Hemoglobin: 12.9 g/dL (ref 12.0–15.0)
MCH: 30.3 pg (ref 26.0–34.0)
MCHC: 35.3 g/dL (ref 30.0–36.0)
MCV: 85.7 fL (ref 80.0–100.0)
Platelets: 289 10*3/uL (ref 150–400)
RBC: 4.26 MIL/uL (ref 3.87–5.11)
RDW: 13.2 % (ref 11.5–15.5)
WBC: 5.7 10*3/uL (ref 4.0–10.5)
nRBC: 0 % (ref 0.0–0.2)

## 2020-05-21 LAB — BASIC METABOLIC PANEL
Anion gap: 11 (ref 5–15)
BUN: <5 mg/dL — ABNORMAL LOW (ref 6–20)
CO2: 27 mmol/L (ref 22–32)
Calcium: 9.7 mg/dL (ref 8.9–10.3)
Chloride: 99 mmol/L (ref 98–111)
Creatinine, Ser: 0.66 mg/dL (ref 0.44–1.00)
GFR, Estimated: >60 mL/min (ref >60)
Glucose, Bld: 89 mg/dL (ref 70–99)
Potassium: 4.2 mmol/L (ref 3.5–5.1)
Sodium: 137 mmol/L (ref 135–145)

## 2020-05-21 LAB — GLUCOSE, CAPILLARY
Glucose-Capillary: 93 mg/dL (ref 70–99)
Glucose-Capillary: 91 mg/dL (ref 70–99)
Glucose-Capillary: 94 mg/dL (ref 70–99)

## 2020-05-21 LAB — TISSUE TRANSGLUTAMINASE, IGG: Tissue Transglut Ab: <2 U/mL (ref 0–5)

## 2020-05-21 LAB — IGA: IgA: 61 mg/dL — ABNORMAL LOW (ref 87–352)

## 2020-05-21 LAB — MPO/PR-3 (ANCA) ANTIBODIES
ANCA Proteinase 3: <3.5 U/mL (ref 0.0–3.5)
Myeloperoxidase Abs: <9 U/mL (ref 0.0–9.0)

## 2020-05-21 LAB — LIPASE, BLOOD: Lipase: 106 U/L — ABNORMAL HIGH (ref 11–51)

## 2020-05-21 MED ORDER — OXYCODONE HCL 5 MG PO TABS
5.0000 mg | ORAL_TABLET | ORAL | Status: DC | PRN
  Administered 2020-05-21 (×2): 10 mg via ORAL
  Administered 2020-05-22: 5 mg via ORAL
  Administered 2020-05-22: 10 mg via ORAL
  Administered 2020-05-22 – 2020-05-23 (×3): 5 mg via ORAL
  Administered 2020-05-23 (×4): 10 mg via ORAL
  Administered 2020-05-23: 5 mg via ORAL
  Administered 2020-05-24: 10 mg via ORAL
  Filled 2020-05-21 (×2): qty 2
  Filled 2020-05-21 (×2): qty 1
  Filled 2020-05-21 (×2): qty 2
  Filled 2020-05-21 (×4): qty 1
  Filled 2020-05-21 (×4): qty 2

## 2020-05-21 MED ORDER — IOHEXOL 300 MG/ML  SOLN
100.0000 mL | Freq: Once | INTRAMUSCULAR | Status: AC | PRN
  Administered 2020-05-21: 100 mL via INTRAVENOUS

## 2020-05-21 MED ORDER — IOHEXOL 9 MG/ML PO SOLN
500.0000 mL | ORAL | Status: AC
Start: 2020-05-21 — End: 2020-05-21
  Administered 2020-05-21 (×2): 500 mL via ORAL

## 2020-05-21 MED ORDER — ONDANSETRON HCL 4 MG/2ML IJ SOLN
4.0000 mg | Freq: Four times a day (QID) | INTRAMUSCULAR | Status: DC | PRN
  Administered 2020-05-21 – 2020-05-22 (×2): 4 mg via INTRAVENOUS
  Filled 2020-05-21 (×2): qty 2

## 2020-05-21 MED ORDER — LORAZEPAM 2 MG/ML IJ SOLN
0.5000 mg | INTRAMUSCULAR | Status: DC | PRN
  Administered 2020-05-21: 0.5 mg via INTRAVENOUS
  Filled 2020-05-21: qty 1

## 2020-05-21 NOTE — Progress Notes (Signed)
Progress Note   Subjective  Patient continues to have upper abdominal pain and nausea.  Had issues with IV which has been replaced. She has been changed from IV Dilaudid to oral oxycodone. Pain is improved now 6 out of 10 but she is having nausea and waiting for Zofran She did eat regular food last night and pain worsened.  Per nursing she had half of a [place] sandwich for lunch Primary team has ordered CT scan of the abdomen pelvis with IV and oral contrast, she completed the oral contrast and is waiting on this currently   Objective  Vital signs in last 24 hours: Temp:  [97.9 F (36.6 C)-99.1 F (37.3 C)] 99.1 F (37.3 C) (01/03 1007) Pulse Rate:  [60-76] 76 (01/03 1007) Resp:  [18] 18 (01/03 1007) BP: (97-120)/(62-83) 117/83 (01/03 1007) SpO2:  [97 %-100 %] 100 % (01/03 1007) Last BM Date:  (prior to admission)  General: Alert, well-developed, in NAD  Abdomen:  Soft, nontender and nondistended. Normal bowel sounds, without guarding, and without rebound.   Extremities:  Without edema. Neurologic:  Alert and  oriented x4; grossly normal neurologically. Psych:  Alert and cooperative. Normal mood and affect.  Intake/Output from previous day: 01/02 0701 - 01/03 0700 In: 600 [P.O.:600] Out: 0  Intake/Output this shift: Total I/O In: 480 [P.O.:480] Out: 0   Lab Results: Recent Labs    05/19/20 0619 05/20/20 0430 05/21/20 0652  WBC 7.6 4.9 5.7  HGB 12.0 11.5* 12.9  HCT 35.6* 35.7* 36.5  PLT 270 241 289   BMET Recent Labs    05/19/20 0619 05/20/20 0430 05/21/20 0652  NA 136 135 137  K 4.2 4.2 4.2  CL 103 101 99  CO2 23 27 27   GLUCOSE 90 100* 89  BUN <5* <5* <5*  CREATININE 0.63 0.71 0.66  CALCIUM 8.7* 8.9 9.7   LFT Recent Labs    05/19/20 0619 05/20/20 0430  PROT 5.8* 5.6*  ALBUMIN 3.5 3.3*  AST 18 16  ALT 19 21  ALKPHOS 27* 21*  BILITOT 0.6 0.5  BILIDIR 0.2  --   IBILI 0.4  --    Lipase     Component Value Date/Time   LIPASE 106 (H)  05/21/2020 0652      Assessment & Recommendations  35 year old female with history of Crohn's colitis (diagnosis 2008), history of iron deficiency anemia, chronic constipation, history of depression who is admitted with abdominal pain, elevated lipase and MRI abdomen consistent with mild pancreatitis.  1.  Acute pancreatitis --etiology unclear, no calcified gallstones or gallstones seen by MRI or CT.  Lipase has not completely normalized and she continues to have pain but overall looks well.  She denies recent heavy alcohol use.  Labs pending though autoimmune pancreatitis is felt less likely. --Follow-up repeat CT scan as ordered by hospital team --We will await additional lab tests including IgG subclass 4 and ANCA titers. --She is tolerating regular diet with oral pain control so I expect she can go home soon with outpatient follow-up --Normal white count, renal function and electrolytes, no fevers --May need to also consider limited gallbladder ultrasound to exclude noncalcified gallstones that may not have been seen by CT. No other obvious etiology for pancreatitis as there is been no new medications and she denies heavy alcohol use. --Repeat imaging in 2 to 3 months can be discussed as an outpatient  2.  Crohn's colitis --in remission clinically off medication.  No colitis by CT scan recently  and no ongoing diarrhea.  We can address this condition as an outpatient when she follows up with Dr. Fuller Plan  Patient is advised to follow-up with Dr. Fuller Plan after discharge    LOS: 2 days   Sonia [name]  05/21/2020, 3:12 PM

## 2020-05-21 NOTE — Plan of Care (Signed)
  Problem: Education: Goal: Knowledge of General Education information will improve Description: Including pain rating scale, medication(s)/side effects and non-pharmacologic comfort measures Outcome: Progressing   Problem: Coping: Goal: Level of anxiety will decrease Outcome: Progressing   Problem: Pain Managment: Goal: General experience of comfort will improve Outcome: Progressing   

## 2020-05-21 NOTE — Progress Notes (Signed)
PROGRESS NOTE  [facility] Teegarden CZY:[id number] DOB: 1985/07/28 DOA: 05/18/2020 PCP: Caren Macadam, MD  Brief History   Sonia [name] is a 35 y.o. female with history of Crohn's disease in remission follows up with [facility] gastroenterologist Dr. Fuller Plan has been experiencing epigastric pain radiating to the back and left side of the chest for the last 1 week had gone to the ER on Monday 5 days ago at that time labs showed elevated lipase and CT scan showed features concerning for pancreatitis.  Patient was discharged home on pain medication.  Patient comes back to the ER because of worsening pain.  Denies nausea vomiting or diarrhea.  Denies any fever chills.  Patient states he drinks alcohol usually 4-5 beers in the weekend.  ED Course: In the ER repeat labs show elevated lipase normal LFTs.  Patient is started on IV fluids pain relief medication and admitted for acute pancreatitis.  MRCP was obtained and demonstrated pancreatitis as well as accumulation of hemosiderin in the liver and spleen. The patient does recall having an iron infusion in October due to iron deficiency anemia. No family history of hemochromatosis. Gastroenterology has been consulted. 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 Supportive care.  Consultants  . Gastroenterology  Procedures  . MRCP  Antibiotics   Anti-infectives (From admission, onward)   None     Subjective  The patient states that she had worsening pain after regular food on 05/20/2020. She is now complaining severely of abdominal pain. However, she was able to eat half of a [place] sandwich for lunch. She states that she has not had a BM for a few days.  Objective   Vitals:  Vitals:   05/21/20 1705 05/21/20 1937  BP: 128/84 110/70  Pulse: 73 62  Resp: 18   Temp: 98.2 F (36.8 C) 98.1 F (36.7 C)  SpO2: 99% 98%    Exam:  Constitutional:  . The patient is awake, alert, and oriented x 3. No acute distress is  apparent. Respiratory:  . No increased work of breathing. . No wheezes, rales, or rhonchi . No tactile fremitus Cardiovascular:  . Regular rate and rhythm . No murmurs, ectopy, or gallups. . No lateral PMI. No thrills. Abdomen:  . Abdomen is distended and tender diffusely. . No hernias, masses, or organomegaly . Hypoactive bowel sounds.  Musculoskeletal:  . No cyanosis, clubbing, or edema Skin:  . No rashes, lesions, ulcers . palpation of skin: no induration or nodules Neurologic:  . CN 2-12 intact . Sensation all 4 extremities intact Psychiatric:  . Mental status o Mood, affect appropriate o Orientation to person, place, time  . judgment and insight appear intact  I have personally reviewed the following:   Today's Data  . Vitals, CBC, CMP  Imaging  . CT abdomen and pelvis . MRCP . Repeat CT abdomen and pelvis: demonstrates no evidence of pancreatitis.  Scheduled Meds: . cetirizine HCl  5 mg Oral Daily  . famotidine  20 mg Oral BID  . heparin  5,000 Units Subcutaneous Q8H  . senna-docusate  1 tablet Oral BID   Continuous Infusions:   Principal Problem:   Acute pancreatitis Active Problems:   Crohn's disease (HCC)   Iron deficiency anemia due to chronic blood loss   Hypoglycemia without diagnosis of diabetes mellitus   Hemochromatosis   LOS: 2 days    A & P  Acute pancreatitis cause not clear.  Patient states she drinks about 4-5 beers in a week.  CT scan does not show any definite gallstones but does show some dilated pancreatic duct for which I have ordered MRCP.  Triglyceride level was within normal levels. We will continue with aggressive hydration pain relief medication. [facility] has been consulted. MRI is remarkable for acute pancreatitis and iron deposition in the liver and spleen. Repeat CT abdomen and pelvis performed due to patient's complaints of worsening pain. It demonstrated no evidence of pancreatitis. Lipase is 106.  I appreciate  gastroenterology's help. Dilaudid has been stopped.  History of Crohn's disease in remission.  History of asthma not wheezing.  Iron deficiency anemia: Will check anemia panel.  I have seen and examined this patient myself. I have spent 32 minutes in her evaluation and care.  DVT prophylaxis: Lovenox. Code Status: Full code. Family Communication: Discussed with patient. Disposition Plan: Home.  Castin Donaghue, DO [facility] Direct contact: see [email or web address]  7PM-7AM contact night coverage as above 05/21/2020, 8:01 PM  LOS: 0 days

## 2020-05-21 NOTE — Progress Notes (Addendum)
Pt appears to this RN to be drug seeking. Every two hours pt is calling for pain medications and sometimes before the two hours are even up. Pt is normally talking on the phone when this RN enters the room and doesn't ever seem to be in a lot of pain. Pt is even refusing pain tab when this RN brought it in and instead wanted the Dilaudid. As soon as this RN went into room at the beginning of the shift, pt told this RN that she needed a new IV because she wasn't "tasting her IV Dilaudid when she receives it" and that something must be wrong with her IV. This RN told pt that her IV is fine and that it is flushing fine. Pt still was insisting on having a new IV, so this RN had IV team to come assess the line and insure pt that it was fine. The IV was just put in last night. Pt then told IV team nurse that there was something wrong with the IV because she couldn't feel the medication in her chest when it was pushed in. IV team nurse told the patient her IV was fine.   Eleanora Neighbor, RN

## 2020-05-22 ENCOUNTER — Inpatient Hospital Stay (HOSPITAL_COMMUNITY): Payer: BC Managed Care – PPO

## 2020-05-22 DIAGNOSIS — K8500 Idiopathic acute pancreatitis without necrosis or infection: Secondary | ICD-10-CM | POA: Diagnosis not present

## 2020-05-22 DIAGNOSIS — K50111 Crohn's disease of large intestine with rectal bleeding: Secondary | ICD-10-CM | POA: Diagnosis not present

## 2020-05-22 DIAGNOSIS — K5903 Drug induced constipation: Secondary | ICD-10-CM

## 2020-05-22 DIAGNOSIS — R1013 Epigastric pain: Secondary | ICD-10-CM

## 2020-05-22 LAB — GLUCOSE, CAPILLARY
Glucose-Capillary: 115 mg/dL — ABNORMAL HIGH (ref 70–99)
Glucose-Capillary: 84 mg/dL (ref 70–99)
Glucose-Capillary: 91 mg/dL (ref 70–99)
Glucose-Capillary: 94 mg/dL (ref 70–99)
Glucose-Capillary: 97 mg/dL (ref 70–99)

## 2020-05-22 LAB — ANCA TITERS
Atypical P-ANCA titer: <1:20 {titer}
C-ANCA: <1:20 {titer}
P-ANCA: <1:20 {titer}

## 2020-05-22 LAB — IGG 4: IgG, Subclass 4: 15 mg/dL (ref 2–96)

## 2020-05-22 MED ORDER — BISACODYL 10 MG RE SUPP
10.0000 mg | Freq: Once | RECTAL | Status: AC
  Administered 2020-05-22: 10 mg via RECTAL
  Filled 2020-05-22: qty 1

## 2020-05-22 MED ORDER — BISACODYL 5 MG PO TBEC: 10.0000 mg | DELAYED_RELEASE_TABLET | Freq: Once | ORAL | Status: DC

## 2020-05-22 MED ORDER — POLYETHYLENE GLYCOL 3350 17 G PO PACK
17.0000 g | PACK | Freq: Every day | ORAL | Status: DC
  Administered 2020-05-22 – 2020-05-23 (×2): 17 g via ORAL
  Filled 2020-05-22 (×2): qty 1

## 2020-05-22 MED ORDER — ONDANSETRON 4 MG PO TBDP
4.0000 mg | ORAL_TABLET | Freq: Three times a day (TID) | ORAL | Status: DC | PRN
  Administered 2020-05-22 – 2020-05-23 (×3): 4 mg via ORAL
  Filled 2020-05-22 (×3): qty 1

## 2020-05-22 MED ORDER — PROMETHAZINE HCL 25 MG/ML IJ SOLN
25.0000 mg | Freq: Four times a day (QID) | INTRAMUSCULAR | Status: DC | PRN
  Administered 2020-05-22: 25 mg via INTRAVENOUS
  Filled 2020-05-22: qty 1

## 2020-05-22 MED ORDER — FLUCONAZOLE 150 MG PO TABS
150.0000 mg | ORAL_TABLET | Freq: Once | ORAL | Status: AC
  Administered 2020-05-22: 150 mg via ORAL
  Filled 2020-05-22: qty 1

## 2020-05-22 MED ORDER — HYDROMORPHONE HCL 1 MG/ML IJ SOLN
1.0000 mg | Freq: Once | INTRAMUSCULAR | Status: AC | PRN
Start: 2020-05-22 — End: 2020-05-22
  Administered 2020-05-22: 1 mg via INTRAVENOUS
  Filled 2020-05-22: qty 1

## 2020-05-22 NOTE — Progress Notes (Signed)
Daily Rounding Note  05/22/2020, 12:05 PM  LOS: 3 days   SUBJECTIVE:   Chief complaint: I will pancreatitis, lingering LUQ pain.  Nausea.  Constipation      Has been getting her narcotics when available.  Some nausea, no vomiting.  She thinks the nausea is due to all the pills she is taking. Still no bowel movement in over a week despite Senokot twice daily.  MiraLAX ordered for this morning.  This morning Dr. Benny Lennert ordered ultrasound of the abdomen but it has yet to be performed.  OBJECTIVE:         Vital signs in last 24 hours:    Temp:  [97.7 F (36.5 C)-98.2 F (36.8 C)] 97.7 F (36.5 C) (01/04 0922) Pulse Rate:  [60-73] 60 (01/04 0922) Resp:  [16-18] 18 (01/04 0922) BP: (110-128)/(70-84) 121/84 (01/04 0922) SpO2:  [98 %-100 %] 99 % (01/04 0922) Last BM Date:  (pt states about a week ago (PTA)) Filed Weights   05/18/20 1648 05/19/20 0903 05/19/20 2132  Weight: 70.3 kg 71.4 kg 71.4 kg   General: Patient looks very well.  Comfortable.  No distress.  When I first went in she was in the shower and the next time I entered the room she was standing at the sink in no distress. Heart: RRR Chest: No labored breathing or cough Abdomen: Soft.  Minimal tenderness in the left upper quadrant.  No guarding or rebound Extremities: No CCE. Neuro/Psych: Oriented x3.  Calm, alert.  Fluid speech.  Intake/Output from previous day: 01/03 0701 - 01/04 0700 In: 480 [P.O.:480] Out: 0   Intake/Output this shift: No intake/output data recorded.  Lab Results: Recent Labs    05/20/20 0430 05/21/20 0652  WBC 4.9 5.7  HGB 11.5* 12.9  HCT 35.7* 36.5  PLT 241 289   BMET Recent Labs    05/20/20 0430 05/21/20 0652  NA 135 137  K 4.2 4.2  CL 101 99  CO2 27 27  GLUCOSE 100* 89  BUN <5* <5*  CREATININE 0.71 0.66  CALCIUM 8.9 9.7   LFT Recent Labs    05/20/20 0430  PROT 5.6*  ALBUMIN 3.3*  AST 16  ALT 21  ALKPHOS  21*  BILITOT 0.5   PT/INR No results for input(s): LABPROT, INR in the last 72 hours. Hepatitis Panel No results for input(s): HEPBSAG, HCVAB, HEPAIGM, HEPBIGM in the last 72 hours.  Studies/Results: CT ABDOMEN PELVIS W CONTRAST  Result Date: 05/21/2020 CLINICAL DATA:  Epigastric and abdominal pain question pancreatitis, history of Crohn's disease, GERD EXAM: CT ABDOMEN AND PELVIS WITH CONTRAST TECHNIQUE: Multidetector CT imaging of the abdomen and pelvis was performed using the standard protocol following bolus administration of intravenous contrast. Sagittal and coronal MPR images reconstructed from axial data set. CONTRAST:  161m OMNIPAQUE IOHEXOL 300 MG/ML SOLN IV. Dilute oral contrast. COMPARISON:  05/15/2020 FINDINGS: Lower chest: Lung bases clear Hepatobiliary: Gallbladder and liver normal appearance. No biliary dilatation. Pancreas: Normal appearance. No peripancreatic edema, pancreatic mass, fluid collection or calcification. Spleen: Normal appearance Adrenals/Urinary Tract: Adrenal glands, kidneys, ureters, and bladder normal appearance Stomach/Bowel: Stomach and bowel loops normal appearance. Appendix not visualized. Vascular/Lymphatic: Vascular structures patent.  No adenopathy. Reproductive: Decreased size of cyst seen within LEFT ovary on the previous exam. Small amount of fluid within endometrial canal, unchanged. Other: No free air or free fluid. No hernia or inflammatory process. Musculoskeletal: Unremarkable IMPRESSION: No acute intra-abdominal or intrapelvic abnormalities. Specifically, no  CT evidence of acute pancreatitis. Electronically Signed   By: Lavonia Dana M.D.   On: 05/21/2020 15:42    ASSESMENT:   *   Pancreatitis, mild, etiology unclear.  Lipase upper 200s, LFTs normal. MRCP suggests mild pancreatitis but no biliary ductal dilatation.  3.2 cm left ovarian cyst with small fluid in endometrial canal. CTAP x 2.  #1 w minor fat stranding at the pancreatic head and proximal  body, mild PD dilatation.  No findings in the gallbladder.  Decreased size in left ovarian cyst with persistent small fluid in the endometrial canal. CTAP # 2: Normal gallbladder, liver, pancreas, biliary and pancreatic ducts Abdominal pain out of proportion to imaging findings. Pending labs include IgG 4, ANCA titers.    *    Crohn's colitis.  DX 2008.  Off treatment, Lialda, for many years. No evidence of active colitis on above CT scans.  *    Constipation.  Added Dulcolax PR   PLAN   *   Ultrasound was ordered but patient is not n.p.o.  Nursing care order placed to keep patient n.p.o. and resume heart healthy diet after the ultrasound.      Azucena Freed  05/22/2020, 12:05 PM Phone [phone]

## 2020-05-22 NOTE — Progress Notes (Signed)
Patient complained severe epigastric cramping despite oxycodone and tylenol prn given. Upon assessment pt was crying and guarding abdomen and rated pain at 10/10. Opyd, MD notified and ordered once dose of dilaudid for patient. Will continue to monitor.   Jacarra Bobak,RN.

## 2020-05-22 NOTE — Progress Notes (Signed)
PROGRESS NOTE  Sonia [name] TML:[id number] DOB: 01-Oct-1985 DOA: 05/18/2020 PCP: Sonia Macadam, MD  Brief History   Sonia [name] is a 35 y.o. female with history of Crohn's disease in remission follows up with [place] gastroenterologist Dr. Fuller [name] has been experiencing epigastric pain radiating to the back and left side of the chest for the last 1 week had gone to the ER on Monday 5 days ago at that time labs showed elevated lipase and CT scan showed features concerning for pancreatitis.  Patient was discharged home on pain medication.  Patient comes back to the ER because of worsening pain.  Denies nausea vomiting or diarrhea.  Denies any fever chills.  Patient states he drinks alcohol usually 4-5 beers in the weekend.  ED Course: In the ER repeat labs show elevated lipase normal LFTs.  Patient is started on IV fluids pain relief medication and admitted for acute pancreatitis.  MRCP was obtained and demonstrated pancreatitis as well as accumulation of hemosiderin in the liver and spleen. The patient does recall having an iron infusion in October due to iron deficiency anemia. No family history of hemochromatosis. Gastroenterology has been consulted. 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 Supportive care.  Pt has ongoing complaints of abdominal pain. Repeat CT abdomen is negative for any sign of pancreatitis. Right upper quadrant ultrasound is also negative for possible source of pain.   The patient does complain of constipation. Sonia [name] has examined the patient and he has ordered a suppository. He has cleared her for discharge following BM.  Consultants  . Gastroenterology  Procedures  . MRCP  Antibiotics   Anti-infectives (From admission, onward)   Start     Dose/Rate Route Frequency Ordered Stop   05/22/20 1345  fluconazole (DIFLUCAN) tablet 150 mg        150 mg Oral  Once 05/22/20 1245 05/22/20 1653     Subjective  The patient continues to  complain of abdominal pain. However, she is moving freely about in bed. She states that she still has not had a BM, although one is documented. Spouse is at bedside.  Objective   Vitals:  Vitals:   05/22/20 0515 05/22/20 0922  BP: 126/75 121/84  Pulse: 60 60  Resp: 16 18  Temp: 98.2 F (36.8 C) 97.7 F (36.5 C)  SpO2: 100% 99%    Exam:  Constitutional:  . The patient is awake, alert, and oriented x 3. No acute distress is apparent. Respiratory:  . No increased work of breathing. . No wheezes, rales, or rhonchi . No tactile fremitus Cardiovascular:  . Regular rate and rhythm . No murmurs, ectopy, or gallups. . No lateral PMI. No thrills. Abdomen:  . Abdomen is distended and tender diffusely. . No hernias, masses, or organomegaly . Hypoactive bowel sounds.  Musculoskeletal:  . No cyanosis, clubbing, or edema Skin:  . No rashes, lesions, ulcers . palpation of skin: no induration or nodules Neurologic:  . CN 2-12 intact . Sensation all 4 extremities intact Psychiatric:  . Mental status o Mood, affect appropriate o Orientation to person, place, time  . judgment and insight appear intact  I have personally reviewed the following:   Today's Data  . Vitals, CBC, CMP  Imaging  . CT abdomen and pelvis . MRCP . Repeat CT abdomen and pelvis: demonstrates no evidence of pancreatitis or source of abdominal pain. . Right upper quadrant ultrasound: Unremarkable.  Scheduled Meds: . cetirizine HCl  5 mg Oral Daily  .  famotidine  20 mg Oral BID  . heparin  5,000 Units Subcutaneous Q8H  . polyethylene glycol  17 g Oral Daily  . senna-docusate  1 tablet Oral BID   Continuous Infusions:   Principal Problem:   Acute pancreatitis Active Problems:   Crohn's disease (HCC)   Iron deficiency anemia due to chronic blood loss   Hypoglycemia without diagnosis of diabetes mellitus   Hemochromatosis   LOS: 3 days    A & P  Acute pancreatitis cause not clear.  Patient  states she drinks about 4-5 beers in a week.  CT scan does not show any definite gallstones but does show some dilated pancreatic duct for which I have ordered MRCP.  Triglyceride level was within normal levels. We will continue with aggressive hydration pain relief medication. [facility] has been consulted. MRI is remarkable for acute pancreatitis and iron deposition in the liver and spleen. Repeat CT abdomen and pelvis performed due to patient's complaints of worsening pain. It demonstrated no evidence of pancreatitis. Lipase is 106.  I appreciate gastroenterology's help. Dilaudid has been stopped. Right upper quadrant ultrasound was ordered at GI's suggestion. It was unremarkable. Sonia [name] has ordered suppository. He has cleared her for discharge once she has a BM.  History of Crohn's disease in remission.  History of asthma not wheezing.  Iron deficiency anemia: Will check anemia panel.  I have seen and examined this patient myself. I have spent 35 minutes in her evaluation and care.  DVT prophylaxis: Lovenox. Code Status: Full code. Family Communication: Discussed with patient. Disposition [name]: Home.  Sonia Ruffner, DO [facility] Direct contact: see [email or web address]  7PM-7AM contact night coverage as above 05/22/2020, 6:16 PM  LOS: 0 days

## 2020-05-22 NOTE — Progress Notes (Signed)
Patient received phenergan IV diluted as prescribed, about 50mn later the patient reported redness and burning at the site I have removed the IV and will be monitoring for increasing symptoms. MD has been notified and there are no new orders at this time

## 2020-05-22 NOTE — Plan of Care (Signed)
  Problem: Education: Goal: Knowledge of General Education information will improve Description: Including pain rating scale, medication(s)/side effects and non-pharmacologic comfort measures Outcome: Progressing   Problem: Coping: Goal: Level of anxiety will decrease Outcome: Progressing   Problem: Elimination: Goal: Will not experience complications related to bowel motility Outcome: Progressing   Problem: Pain Managment: Goal: General experience of comfort will improve Outcome: Progressing

## 2020-05-22 NOTE — Progress Notes (Signed)
Pt refused PIV access at this time. No continuous medications noted.

## 2020-05-22 NOTE — Progress Notes (Signed)
NEW ADMISSION NOTE New Admission Note:   Arrival Method: E.D. stretcher bed Mental Orientation: Alert and oriented x 4 Telemetry: BOx #12  Assessment: Completed Skin: Skin intact as assessed by Debarah Crape R.N. IV: Right FA.  Pain: 7 -See pain documentation Tubes: None Safety Measures: Safety Fall Prevention Plan has been given, discussed and signed Admission: Completed [facility] Orientation: Patient has been orientated to the room, unit and staff.  Family:None at the bedside.              Patient refused covid-19 vaccicnes as offered during screening process.  Orders have been reviewed and implemented. Will continue to monitor the patient. Call light has been placed within reach and bed alarm has been activated.   [place]ather[place], Zenon Mayo, RN

## 2020-05-22 NOTE — Progress Notes (Signed)
Patient refused soap suds enema, patient was educated, and RN will continue to monitor this patient.

## 2020-05-23 ENCOUNTER — Ambulatory Visit: Payer: BC Managed Care – PPO | Admitting: Nurse Practitioner

## 2020-05-23 DIAGNOSIS — K5909 Other constipation: Secondary | ICD-10-CM

## 2020-05-23 DIAGNOSIS — K8500 Idiopathic acute pancreatitis without necrosis or infection: Secondary | ICD-10-CM | POA: Diagnosis not present

## 2020-05-23 DIAGNOSIS — K50111 Crohn's disease of large intestine with rectal bleeding: Secondary | ICD-10-CM | POA: Diagnosis not present

## 2020-05-23 DIAGNOSIS — Z8719 Personal history of other diseases of the digestive system: Secondary | ICD-10-CM

## 2020-05-23 DIAGNOSIS — R1013 Epigastric pain: Secondary | ICD-10-CM | POA: Diagnosis not present

## 2020-05-23 LAB — GLUCOSE, CAPILLARY: Glucose-Capillary: 99 mg/dL (ref 70–99)

## 2020-05-23 MED ORDER — OXYCODONE HCL 5 MG PO TABS: 5.0000 mg | ORAL_TABLET | ORAL | 0 refills | Status: DC | PRN

## 2020-05-23 MED ORDER — DICYCLOMINE HCL 20 MG PO TABS
20.0000 mg | ORAL_TABLET | Freq: Three times a day (TID) | ORAL | Status: DC
Start: 2020-05-23 — End: 2020-05-24
  Administered 2020-05-23 – 2020-05-24 (×3): 20 mg via ORAL
  Filled 2020-05-23 (×3): qty 1

## 2020-05-23 MED ORDER — POLYETHYLENE GLYCOL 3350 17 G PO PACK: 17.0000 g | PACK | Freq: Every day | ORAL | 0 refills | Status: DC

## 2020-05-23 NOTE — Plan of Care (Signed)
  Problem: Education: Goal: Knowledge of General Education information will improve Description: Including pain rating scale, medication(s)/side effects and non-pharmacologic comfort measures Outcome: Progressing   Problem: Coping: Goal: Level of anxiety will decrease Outcome: Progressing   Problem: Pain Managment: Goal: General experience of comfort will improve Outcome: Progressing   

## 2020-05-23 NOTE — Plan of Care (Signed)
  Problem: Nutrition: Goal: Adequate nutrition will be maintained Outcome: Progressing   Problem: Coping: Goal: Level of anxiety will decrease Outcome: Progressing   Problem: Pain Managment: Goal: General experience of comfort will improve Outcome: Progressing   

## 2020-05-23 NOTE — Progress Notes (Addendum)
  Progress Note   Subjective  Patient reports she woke up with intense epigastric slightly left of center abdominal pain Had to bowel movements yesterday after suppository Has not eaten anything solid today, had sips of apple juice at 9 AM Pain now 8-9 out of 10 She reports she is very worked up after being told she was going home No vomiting   Objective  Vital signs in last 24 hours: Temp:  [97.7 F (36.5 C)-98.7 F (37.1 C)] 97.7 F (36.5 C) (01/05 0918) Pulse Rate:  [68-73] 68 (01/05 0918) Resp:  [16-18] 18 (01/05 0918) BP: (109-128)/(81-90) 128/90 (01/05 0918) SpO2:  [97 %-99 %] 99 % (01/05 0918) Last BM Date: 05/23/20  General: Alert, well-developed, tearful but in NAD Heart:  Regular rate and rhythm; no murmurs Abdomen:  Soft, nontender and nondistended. Normal bowel sounds, without guarding, and without rebound.   Extremities:  Without edema. Neurologic:  Alert and  oriented x4; grossly normal neurologically. Psych:  Alert and cooperative. Normal mood and affect.  Intake/Output from previous day: 01/04 0701 - 01/05 0700 In: 680 [P.O.:680] Out: 0  Intake/Output this shift: Total I/O In: 480 [P.O.:480] Out: -   Lab Results: Recent Labs    05/21/20 0652  WBC 5.7  HGB 12.9  HCT 36.5  PLT 289   BMET Recent Labs    05/21/20 0652  NA 137  K 4.2  CL 99  CO2 27  GLUCOSE 89  BUN <5*  CREATININE 0.66  CALCIUM 9.7   Studies/Results: CT ABDOMEN PELVIS W CONTRAST  Result Date: 05/21/2020 CLINICAL DATA:  Epigastric and abdominal pain question pancreatitis, history of Crohn's disease, GERD EXAM: CT ABDOMEN AND PELVIS WITH CONTRAST TECHNIQUE: Multidetector CT imaging of the abdomen and pelvis was performed using the standard protocol following bolus administration of intravenous contrast. Sagittal and coronal MPR images reconstructed from axial data set. CONTRAST:  100mL OMNIPAQUE IOHEXOL 300 MG/ML SOLN IV. Dilute oral contrast. COMPARISON:  05/15/2020  FINDINGS: Lower chest: Lung bases clear Hepatobiliary: Gallbladder and liver normal appearance. No biliary dilatation. Pancreas: Normal appearance. No peripancreatic edema, pancreatic mass, fluid collection or calcification. Spleen: Normal appearance Adrenals/Urinary Tract: Adrenal glands, kidneys, ureters, and bladder normal appearance Stomach/Bowel: Stomach and bowel loops normal appearance. Appendix not visualized. Vascular/Lymphatic: Vascular structures patent.  No adenopathy. Reproductive: Decreased size of cyst seen within LEFT ovary on the previous exam. Small amount of fluid within endometrial canal, unchanged. Other: No free air or free fluid. No hernia or inflammatory process. Musculoskeletal: Unremarkable IMPRESSION: No acute intra-abdominal or intrapelvic abnormalities. Specifically, no CT evidence of acute pancreatitis. Electronically Signed   By: Mark  Boles M.D.   On: 05/21/2020 15:42   US Abdomen Limited RUQ (LIVER/GB)  Result Date: 05/22/2020 CLINICAL DATA:  Epigastric pain for the past 2 weeks. EXAM: ULTRASOUND ABDOMEN LIMITED RIGHT UPPER QUADRANT COMPARISON:  CT abdomen pelvis dated May 21, 2020. FINDINGS: Gallbladder: No gallstones or wall thickening visualized. No sonographic Murphy sign noted by sonographer. Common bile duct: Diameter: 6 mm, normal. Liver: No focal lesion identified. Within normal limits in parenchymal echogenicity. Portal vein is patent on color Doppler imaging with normal direction of blood flow towards the liver. Other: None. IMPRESSION: 1. Normal right upper quadrant ultrasound. Electronically Signed   By: William T Derry M.D.   On: 05/22/2020 15:18      Assessment & Recommendations  35-year-old female with history of Crohn's colitis, chronic constipation admitted with upper abdominal pain and pancreatitis by imaging with elevated lipase    1.  Pancreatitis --mild and of unclear etiology.  Gallbladder normal.  Patient denies heavy alcohol use.  No definite  evidence for autoimmune pancreatitis with normal IgG4 and lack of classic autoimmune pancreatitis on imaging.  Pancreas was actually normal 2 days ago when CT was repeated, though her pain has continued.  2.  Epigastric pain --felt to be secondary to pancreatitis though it has fluctuated.  Plan had been for discharge home today though abdominal pain has returned.  Will pursue upper endoscopy today to rule out peptic ulcer disease, upper GI Crohn's disease --EGD with MAC --Begin Bentyl 20 mg 3 times daily --Minimize narcotics as we discussed that narcotics can exacerbate abdominal pain and cause gastroparesis and worsen overall gut motility and constipation  3.  Chronic constipation --continue senna; she did have a good bowel movement yesterday after Dulcolax suppository.  Continue MiraLAX.    LOS: 4 days   Kashish Yglesias M Daune Divirgilio  05/23/2020, 12:52 PM  Addendum: I am now told by endoscopy that there is no anesthesia slots available for the remainder of today.  Upper endoscopy will need to be tomorrow with monitored anesthesia care.  I have communicated this to Dr. Swayze who will discontinue plans to discharge today. NPO after MN 

## 2020-05-23 NOTE — Progress Notes (Signed)
PROGRESS NOTE  Sonia [name] ION:[id number] DOB: [birth year] DOA: 05/18/2020 PCP: Wynn Banker, MD  Brief History   Sonia [name] is a 35 y.o. female with history of Crohn's disease in remission follows up with [place] gastroenterologist Dr. Russella Dar has been experiencing epigastric pain radiating to the back and left side of the chest for the last 1 week had gone to the ER on Monday 5 days ago at that time labs showed elevated lipase and CT scan showed features concerning for pancreatitis.  Patient was discharged home on pain medication.  Patient comes back to the ER because of worsening pain.  Denies nausea vomiting or diarrhea.  Denies any fever chills.  Patient states he drinks alcohol usually 4-5 beers in the weekend.  ED Course: In the ER repeat labs show elevated lipase normal LFTs.  Patient is started on IV fluids pain relief medication and admitted for acute pancreatitis.  MRCP was obtained and demonstrated pancreatitis as well as accumulation of hemosiderin in the liver and spleen. The patient does recall having an iron infusion in October due to iron deficiency anemia. No family history of hemochromatosis. Gastroenterology has been consulted. 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 Supportive care.  Pt has ongoing complaints of abdominal pain. Repeat CT abdomen is negative for any sign of pancreatitis. Right upper quadrant ultrasound is also negative for possible source of pain.   The patient does complain of constipation. Dr. Rhea Belton has examined the patient and he has ordered a suppository. He had cleared her for discharge following BM. She proceeded to have BM x 2.   However this morning the patient was crying and holding her belly and demanding that EGD be performed prior to discharge. Dr. Rhea Belton came to re-examine the patient and agreed to peform EGD. However, no anesthesia was available to perform the procedure today. It will be performed tomorrow  am.  Consultants  . Gastroenterology  Procedures  . MRCP  Antibiotics   Anti-infectives (From admission, onward)   Start     Dose/Rate Route Frequency Ordered Stop   05/22/20 1345  fluconazole (DIFLUCAN) tablet 150 mg        150 mg Oral  Once 05/22/20 1245 05/22/20 1653     Subjective  The patient walking about her room without difficulty and holding her abdomen. She is crying. Her husband is at bedside. She is demanding to see Dr. Rhea Belton again. He was called and re-evaluated the patient.  Objective   Vitals:  Vitals:   05/23/20 0603 05/23/20 0918  BP: 126/84 128/90  Pulse: 72 68  Resp: 16 18  Temp: 98.7 F (37.1 C) 97.7 F (36.5 C)  SpO2: 97% 99%    Exam:  Constitutional:  . The patient is awake, alert, and oriented x 3. No peritoneal signs as patient is ambulating about the room without difficulty. She is holding her abdomen and crying. Respiratory:  . No increased work of breathing. . No wheezes, rales, or rhonchi . No tactile fremitus Cardiovascular:  . Regular rate and rhythm . No murmurs, ectopy, or gallups. . No lateral PMI. No thrills. Abdomen:  . Abdomen is distended and tender diffusely, although soft. No peritoneal signs . No hernias, masses, or organomegaly . Normoactive active bowel sounds.  Musculoskeletal:  . No cyanosis, clubbing, or edema Skin:  . No rashes, lesions, ulcers . palpation of skin: no induration or nodules Neurologic:  . CN 2-12 intact . Sensation all 4 extremities intact Psychiatric:  . Mental status  o Mood, affect appropriate o Orientation to person, place, time  . judgment and insight appear intact  I have personally reviewed the following:   Today's Data  . Vitals  Imaging  . CT abdomen and pelvis . MRCP . Repeat CT abdomen and pelvis: demonstrates no evidence of pancreatitis or source of abdominal pain. . Right upper quadrant ultrasound: Unremarkable.  Scheduled Meds: . cetirizine HCl  5 mg Oral Daily  .  dicyclomine  20 mg Oral TID  . famotidine  20 mg Oral BID  . heparin  5,000 Units Subcutaneous Q8H  . polyethylene glycol  17 g Oral Daily  . senna-docusate  1 tablet Oral BID   Continuous Infusions:   Principal Problem:   Acute pancreatitis Active Problems:   Crohn's disease (HCC)   Iron deficiency anemia due to chronic blood loss   Hypoglycemia without diagnosis of diabetes mellitus   Hemochromatosis   LOS: 4 days    A & P  Acute pancreatitis cause not clear.  Patient states she drinks about 4-5 beers in a week.  CT scan does not show any definite gallstones but does show some dilated pancreatic duct for which I have ordered MRCP.  Triglyceride level was within normal levels. We will continue with aggressive hydration pain relief medication. [facility] has been consulted. MRI is remarkable for acute pancreatitis and iron deposition in the liver and spleen. Repeat CT abdomen and pelvis performed due to patient's complaints of worsening pain. It demonstrated no evidence of pancreatitis. Lipase is 106.  I appreciate gastroenterology's help. Dilaudid has been stopped. Right upper quadrant ultrasound was ordered at GI's suggestion. It was unremarkable. Dr. Rhea Belton has ordered suppository. On 05/22/2020 the patient was cleared for discharge by Dr. Rhea Belton once she had a BM.  She proceeded to have BM x 2. However this morning the patient was crying and holding her belly and demanding that EGD be performed prior to discharge. Dr. Rhea Belton came to re-examine the patient and agreed to peform EGD. However, no anesthesia was available to perform the procedure today. It will be performed tomorrow am.  History of Crohn's disease in remission.  History of asthma not wheezing.  Iron deficiency anemia: Will check anemia panel.  I have seen and examined this patient myself. I have spent 42  minutes in her evaluation and care.  DVT prophylaxis: Lovenox. Code Status: Full code. Family Communication: spouse  was at bedside Disposition Plan: Home.  Sonia Jolliff, DO [facility] Direct contact: see [email or web address]  7PM-7AM contact night coverage as above 05/23/2020, 5:20 PM  LOS: 0 days

## 2020-05-23 NOTE — H&P (View-Only) (Signed)
Progress Note   Subjective  Patient reports she woke up with intense epigastric slightly left of center abdominal pain Had to bowel movements yesterday after suppository Has not eaten anything solid today, had sips of apple juice at 9 AM Pain now 8-9 out of 10 She reports she is very worked up after being told she was going home No vomiting   Objective  Vital signs in last 24 hours: Temp:  [97.7 F (36.5 C)-98.7 F (37.1 C)] 97.7 F (36.5 C) (01/05 0918) Pulse Rate:  [68-73] 68 (01/05 0918) Resp:  [16-18] 18 (01/05 0918) BP: (109-128)/(81-90) 128/90 (01/05 0918) SpO2:  [97 %-99 %] 99 % (01/05 0918) Last BM Date: 05/23/20  General: Alert, well-developed, tearful but in NAD Heart:  Regular rate and rhythm; no murmurs Abdomen:  Soft, nontender and nondistended. Normal bowel sounds, without guarding, and without rebound.   Extremities:  Without edema. Neurologic:  Alert and  oriented x4; grossly normal neurologically. Psych:  Alert and cooperative. Normal mood and affect.  Intake/Output from previous day: 01/04 0701 - 01/05 0700 In: 35 [P.O.:680] Out: 0  Intake/Output this shift: Total I/O In: 480 [P.O.:480] Out: -   Lab Results: Recent Labs    05/21/20 0652  WBC 5.7  HGB 12.9  HCT 36.5  PLT 289   BMET Recent Labs    05/21/20 0652  NA 137  K 4.2  CL 99  CO2 27  GLUCOSE 89  BUN <5*  CREATININE 0.66  CALCIUM 9.7   Studies/Results: CT ABDOMEN PELVIS W CONTRAST  Result Date: 05/21/2020 CLINICAL DATA:  Epigastric and abdominal pain question pancreatitis, history of Crohn's disease, GERD EXAM: CT ABDOMEN AND PELVIS WITH CONTRAST TECHNIQUE: Multidetector CT imaging of the abdomen and pelvis was performed using the standard protocol following bolus administration of intravenous contrast. Sagittal and coronal MPR images reconstructed from axial data set. CONTRAST:  190m OMNIPAQUE IOHEXOL 300 MG/ML SOLN IV. Dilute oral contrast. COMPARISON:  05/15/2020  FINDINGS: Lower chest: Lung bases clear Hepatobiliary: Gallbladder and liver normal appearance. No biliary dilatation. Pancreas: Normal appearance. No peripancreatic edema, pancreatic mass, fluid collection or calcification. Spleen: Normal appearance Adrenals/Urinary Tract: Adrenal glands, kidneys, ureters, and bladder normal appearance Stomach/Bowel: Stomach and bowel loops normal appearance. Appendix not visualized. Vascular/Lymphatic: Vascular structures patent.  No adenopathy. Reproductive: Decreased size of cyst seen within LEFT ovary on the previous exam. Small amount of fluid within endometrial canal, unchanged. Other: No free air or free fluid. No hernia or inflammatory process. Musculoskeletal: Unremarkable IMPRESSION: No acute intra-abdominal or intrapelvic abnormalities. Specifically, no CT evidence of acute pancreatitis. Electronically Signed   By: MLavonia DanaM.D.   On: 05/21/2020 15:42   UKoreaAbdomen Limited RUQ (LIVER/GB)  Result Date: 05/22/2020 CLINICAL DATA:  Epigastric pain for the past 2 weeks. EXAM: ULTRASOUND ABDOMEN LIMITED RIGHT UPPER QUADRANT COMPARISON:  CT abdomen pelvis dated May 21, 2020. FINDINGS: Gallbladder: No gallstones or wall thickening visualized. No sonographic Murphy sign noted by sonographer. Common bile duct: Diameter: 6 mm, normal. Liver: No focal lesion identified. Within normal limits in parenchymal echogenicity. Portal vein is patent on color Doppler imaging with normal direction of blood flow towards the liver. Other: None. IMPRESSION: 1. Normal right upper quadrant ultrasound. Electronically Signed   By: WTitus DubinM.D.   On: 05/22/2020 15:18      Assessment & Recommendations  35year old female with history of Crohn's colitis, chronic constipation admitted with upper abdominal pain and pancreatitis by imaging with elevated lipase  1.  Pancreatitis --mild and of unclear etiology.  Gallbladder normal.  Patient denies heavy alcohol use.  No definite  evidence for autoimmune pancreatitis with normal IgG4 and lack of classic autoimmune pancreatitis on imaging.  Pancreas was actually normal 2 days ago when CT was repeated, though her pain has continued.  2.  Epigastric pain --felt to be secondary to pancreatitis though it has fluctuated.  Plan had been for discharge home today though abdominal pain has returned.  Will pursue upper endoscopy today to rule out peptic ulcer disease, upper GI Crohn's disease --EGD with MAC --Begin Bentyl 20 mg 3 times daily --Minimize narcotics as we discussed that narcotics can exacerbate abdominal pain and cause gastroparesis and worsen overall gut motility and constipation  3.  Chronic constipation --continue senna; she did have a good bowel movement yesterday after Dulcolax suppository.  Continue MiraLAX.    LOS: 4 days   Jerene Bears  05/23/2020, 12:52 PM  Addendum: I am now told by endoscopy that there is no anesthesia slots available for the remainder of today.  Upper endoscopy will need to be tomorrow with monitored anesthesia care.  I have communicated this to Dr. Benny Lennert who will discontinue plans to discharge today. NPO after MN

## 2020-05-23 NOTE — Progress Notes (Signed)
Patient complaining of abdominal pain 10/10 after being  Medicated with 1 tab of Oxy  for pain at 0854. Patient started to complain of severe abdominal pain 10/10 after 2 hours and 45 min later. MD made aware and was asked if ok to give other 5 mg dose  With ok to give. Medicated patient with Oxy 5 mg, and patient was seen by MD at bedside.

## 2020-05-24 ENCOUNTER — Encounter (HOSPITAL_COMMUNITY): Payer: Self-pay | Admitting: Internal Medicine

## 2020-05-24 ENCOUNTER — Inpatient Hospital Stay (HOSPITAL_COMMUNITY): Payer: BC Managed Care – PPO | Admitting: Certified Registered Nurse Anesthetist

## 2020-05-24 ENCOUNTER — Inpatient Hospital Stay (HOSPITAL_BASED_OUTPATIENT_CLINIC_OR_DEPARTMENT_OTHER): Payer: BC Managed Care – PPO | Admitting: Oncology

## 2020-05-24 ENCOUNTER — Encounter (HOSPITAL_COMMUNITY)
Admission: EM | Disposition: A | Discharge status: Home / Self Care | Payer: Self-pay | Source: Home / Self Care | Attending: Internal Medicine

## 2020-05-24 DIAGNOSIS — R1013 Epigastric pain: Secondary | ICD-10-CM

## 2020-05-24 DIAGNOSIS — K50119 Crohn's disease of large intestine with unspecified complications: Secondary | ICD-10-CM

## 2020-05-24 HISTORY — PX: BIOPSY: SHX5522

## 2020-05-24 HISTORY — PX: ESOPHAGOGASTRODUODENOSCOPY (EGD) WITH PROPOFOL: SHX5813

## 2020-05-24 LAB — GLUCOSE, CAPILLARY
Glucose-Capillary: 82 mg/dL (ref 70–99)
Glucose-Capillary: 81 mg/dL (ref 70–99)

## 2020-05-24 SURGERY — ESOPHAGOGASTRODUODENOSCOPY (EGD) WITH PROPOFOL
Anesthesia: Monitor Anesthesia Care

## 2020-05-24 MED ORDER — PROPOFOL 500 MG/50ML IV EMUL
INTRAVENOUS | Status: DC | PRN
  Administered 2020-05-24: 125 ug/kg/min via INTRAVENOUS

## 2020-05-24 MED ORDER — DICYCLOMINE HCL 20 MG PO TABS: 20.0000 mg | ORAL_TABLET | Freq: Three times a day (TID) | ORAL | 0 refills | Status: DC

## 2020-05-24 MED ORDER — LACTATED RINGERS IV SOLN: INTRAVENOUS | Status: DC | PRN

## 2020-05-24 MED ORDER — SODIUM CHLORIDE 0.9 % IV SOLN: INTRAVENOUS | Status: DC

## 2020-05-24 MED ORDER — PROPOFOL 10 MG/ML IV BOLUS
INTRAVENOUS | Status: DC | PRN
  Administered 2020-05-24 (×3): 10 mg via INTRAVENOUS
  Administered 2020-05-24: 20 mg via INTRAVENOUS
  Administered 2020-05-24 (×2): 10 mg via INTRAVENOUS

## 2020-05-24 MED ORDER — LIDOCAINE 2% (20 MG/ML) 5 ML SYRINGE
INTRAMUSCULAR | Status: DC | PRN
  Administered 2020-05-24: 40 mg via INTRAVENOUS

## 2020-05-24 SURGICAL SUPPLY — 15 items

## 2020-05-24 NOTE — Op Note (Signed)
[facility] Patient Name: Sonia [name] Procedure Date : 05/24/2020 MRN: [id number] Attending MD: Beverley Fiedler , MD Date of Birth: 04-04-1986 CSN: [id number] Age: 35 Admit Type: Inpatient Procedure:                Upper GI endoscopy Indications:              Epigastric abdominal pain, recent idiopathic                            pancreatitis, history of colonic Crohn's disease,                            low IgA Providers:                Carie Caddy. Rhea Belton, MD, Charlett Lango, RN, Lawson Radar, Technician, Koren Bound, CRNA Referring MD:             [facility] Medicines:                Monitored Anesthesia Care Complications:            No immediate complications. Estimated Blood Loss:     Estimated blood loss was minimal. Procedure:                Pre-Anesthesia Assessment:                           - Prior to the procedure, a History and Physical                            was performed, and patient medications and                            allergies were reviewed. The patient's tolerance of                            previous anesthesia was also reviewed. The risks                            and benefits of the procedure and the sedation                            options and risks were discussed with the patient.                            All questions were answered, and informed consent                            was obtained. Prior Anticoagulants: The patient has                            taken no previous anticoagulant or antiplatelet  agents. ASA Grade Assessment: II - A patient with                            mild systemic disease. After reviewing the risks                            and benefits, the patient was deemed in                            satisfactory condition to undergo the procedure.                           After obtaining informed consent, the endoscope was                             passed under direct vision. Throughout the                            procedure, the patient's blood pressure, pulse, and                            oxygen saturations were monitored continuously. The                            GIF-H190 ([id number]) Olympus gastroscope was                            introduced through the mouth, and advanced to the                            second part of duodenum. The upper GI endoscopy was                            accomplished without difficulty. The patient                            tolerated the procedure well. Scope In: Scope Out: Findings:      The examined esophagus was normal.      The cardia and gastric fundus were normal on retroflexion.      The entire examined stomach was normal. Biopsies were taken with a cold       forceps for histology and Helicobacter pylori testing.      The examined duodenum was normal. Biopsies for histology were taken with       a cold forceps for evaluation of celiac disease. Impression:               - Normal esophagus.                           - Normal stomach. Biopsied.                           - Normal examined duodenum. Biopsied. Moderate Sedation:      N/A Recommendation:           - Return patient  to hospital ward for possible                            discharge same day.                           - Low fat diet.                           - Continue present medications.                           - Await pathology results.                           - I will arrange follow-up with Dr. Fuller Plan at                            [place] will sign off. Procedure Code(s):        --- Professional ---                           [id number], Esophagogastroduodenoscopy, flexible,                            transoral; with biopsy, single or multiple Diagnosis Code(s):        --- Professional ---                           R10.13, Epigastric pain CPT copyright 2019 American Medical  Association. All rights reserved. The codes documented in this report are preliminary and upon coder review may  be revised to meet current compliance requirements. Jerene Bears, MD 05/24/2020 11:03:53 AM This report has been signed electronically. Number of Addenda: 0

## 2020-05-24 NOTE — Anesthesia Postprocedure Evaluation (Signed)
Anesthesia Post Note  Patient: Sonia [name]  Procedure(s) Performed: ESOPHAGOGASTRODUODENOSCOPY (EGD) WITH PROPOFOL (N/A ) BIOPSY     Patient location during evaluation: PACU Anesthesia Type: MAC Level of consciousness: awake and alert Pain management: pain level controlled Vital Signs Assessment: post-procedure vital signs reviewed and stable Respiratory status: spontaneous breathing, nonlabored ventilation, respiratory function stable and patient connected to nasal cannula oxygen Cardiovascular status: stable and blood pressure returned to baseline Postop Assessment: no apparent nausea or vomiting Anesthetic complications: no   No complications documented.  Last Vitals:  Vitals:   05/24/20 1115 05/24/20 1130  BP: 114/73 117/70  Pulse: 68 65  Resp: 13 17  Temp:  37 C  SpO2: 100%     Last Pain:  Vitals:   05/24/20 1200  TempSrc:   PainSc: 0-No pain                 Effie Berkshire

## 2020-05-24 NOTE — Progress Notes (Signed)
Patient was hospitalized.  Appointment was rescheduled.  Faythe Casa, NP 06/04/2020 12:51 PM

## 2020-05-24 NOTE — Interval H&P Note (Signed)
History and Physical Interval Note: For EGD this am to eval epigastric pain Pain considerably better to this point this morning The nature of the procedure, as well as the risks, benefits, and alternatives were carefully and thoroughly reviewed with the patient. Ample time for discussion and questions allowed. The patient understood, was satisfied, and agreed to proceed.   05/24/2020 10:25 AM  Sonia [name]  has presented today for surgery, with the diagnosis of epigastric pain.  The various methods of treatment have been discussed with the patient and family. After consideration of risks, benefits and other options for treatment, the patient has consented to  Procedure(s): ESOPHAGOGASTRODUODENOSCOPY (EGD) WITH PROPOFOL (N/A) as a surgical intervention.  The patient's history has been reviewed, patient examined, no change in status, stable for surgery.  I have reviewed the patient's chart and labs.  Questions were answered to the patient's satisfaction.     Carie Caddy [name]

## 2020-05-24 NOTE — Anesthesia Procedure Notes (Signed)
Procedure Name: MAC Date/Time: 05/24/2020 10:28 AM Performed by: Janene Harvey, CRNA Pre-anesthesia Checklist: Patient identified, Emergency Drugs available, Suction available and Patient being monitored Patient Re-evaluated:Patient Re-evaluated prior to induction Oxygen Delivery Method: Nasal cannula Induction Type: IV induction Placement Confirmation: positive ETCO2 Dental Injury: Teeth and Oropharynx as per pre-operative assessment

## 2020-05-24 NOTE — Plan of Care (Signed)
  Problem: Education: Goal: Knowledge of General Education information will improve Description: Including pain rating scale, medication(s)/side effects and non-pharmacologic comfort measures Outcome: Adequate for Discharge   Problem: Health Behavior/Discharge Planning: Goal: Ability to manage health-related needs will improve Outcome: Adequate for Discharge   Problem: Clinical Measurements: Goal: Ability to maintain clinical measurements within normal limits will improve Outcome: Adequate for Discharge Goal: Will remain free from infection Outcome: Adequate for Discharge Goal: Diagnostic test results will improve 05/24/2020 1211 by Dolores Hoose, RN Outcome: Adequate for Discharge 05/24/2020 0743 by Dolores Hoose, RN Outcome: Progressing Goal: Respiratory complications will improve Outcome: Adequate for Discharge Goal: Cardiovascular complication will be avoided Outcome: Adequate for Discharge   Problem: Activity: Goal: Risk for activity intolerance will decrease Outcome: Adequate for Discharge   Problem: Nutrition: Goal: Adequate nutrition will be maintained Outcome: Adequate for Discharge   Problem: Coping: Goal: Level of anxiety will decrease 05/24/2020 1211 by Dolores Hoose, RN Outcome: Adequate for Discharge 05/24/2020 0743 by Dolores Hoose, RN Outcome: Progressing   Problem: Elimination: Goal: Will not experience complications related to bowel motility 05/24/2020 1211 by Dolores Hoose, RN Outcome: Adequate for Discharge 05/24/2020 0743 by Dolores Hoose, RN Outcome: Progressing Goal: Will not experience complications related to urinary retention Outcome: Adequate for Discharge   Problem: Pain Managment: Goal: General experience of comfort will improve Outcome: Adequate for Discharge   Problem: Safety: Goal: Ability to remain free from injury will improve Outcome: Adequate for Discharge   Problem: Skin Integrity: Goal: Risk for impaired skin  integrity will decrease Outcome: Adequate for Discharge

## 2020-05-24 NOTE — Anesthesia Preprocedure Evaluation (Addendum)
Anesthesia Evaluation  Patient identified by MRN, date of birth, ID band Patient awake    Reviewed: Allergy & Precautions, NPO status , Patient's Chart, lab work & pertinent test results  Airway Mallampati: I  TM Distance: >3 FB Neck ROM: Full    Dental no notable dental hx.    Pulmonary asthma ,    Pulmonary exam normal        Cardiovascular negative cardio ROS Normal cardiovascular exam     Neuro/Psych  Headaches, PSYCHIATRIC DISORDERS Anxiety Depression    GI/Hepatic Neg liver ROS, GERD  ,  Endo/Other  negative endocrine ROS  Renal/GU negative Renal ROS     Musculoskeletal negative musculoskeletal ROS (+)   Abdominal   Peds  Hematology negative hematology ROS (+)   Anesthesia Other Findings   Reproductive/Obstetrics                            Anesthesia Physical Anesthesia Plan  ASA: II  Anesthesia Plan: MAC   Post-op Pain Management:    Induction: Intravenous  PONV Risk Score and Plan: Propofol infusion and Ondansetron  Airway Management Planned: Natural Airway and Simple Face Mask  Additional Equipment: None  Intra-op Plan:   Post-operative Plan:   Informed Consent: I have reviewed the patients History and Physical, chart, labs and discussed the procedure including the risks, benefits and alternatives for the proposed anesthesia with the patient or authorized representative who has indicated his/her understanding and acceptance.     Dental advisory given  Plan Discussed with: CRNA  Anesthesia Plan Comments: (Lab Results      Component                Value               Date                      WBC                      5.7                 05/21/2020                HGB                      12.9                05/21/2020                HCT                      36.5                05/21/2020                MCV                      85.7                05/21/2020                 PLT                      289                 05/21/2020           )  Anesthesia Quick Evaluation  

## 2020-05-24 NOTE — Progress Notes (Signed)
DISCHARGE NOTE HOME Sonia [name] to be discharged Home per MD order. Discussed prescriptions and follow up appointments with the patient. Prescriptions given to patient; medication list explained in detail. Patient verbalized understanding.  Skin clean, dry and intact without evidence of skin break down, no evidence of skin tears noted. IV catheter discontinued intact. Site without signs and symptoms of complications. Dressing and pressure applied. Pt denies pain at the site currently. No complaints noted.  Patient free of lines, drains, and wounds.   An After Visit Summary (AVS) was printed and given to the patient. Patient escorted via wheelchair, and discharged home via private auto.  Dolores Hoose, RN

## 2020-05-24 NOTE — Plan of Care (Signed)
  Problem: Clinical Measurements: Goal: Diagnostic test results will improve Outcome: Progressing   Problem: Coping: Goal: Level of anxiety will decrease Outcome: Progressing   Problem: Elimination: Goal: Will not experience complications related to bowel motility Outcome: Progressing

## 2020-05-24 NOTE — Transfer of Care (Signed)
Immediate Anesthesia Transfer of Care Note  Patient: Sonia [name]  Procedure(s) Performed: ESOPHAGOGASTRODUODENOSCOPY (EGD) WITH PROPOFOL (N/A ) BIOPSY  Patient Location: Endoscopy Unit  Anesthesia Type:MAC  Level of Consciousness: drowsy and patient cooperative  Airway & Oxygen Therapy: Patient Spontanous Breathing  Post-op Assessment: Report given to RN and Post -op Vital signs reviewed and stable  Post vital signs: Reviewed  Last Vitals:  Vitals Value Taken Time  BP 126/80 05/24/20 1055  Temp    Pulse 75 05/24/20 1057  Resp 20 05/24/20 1057  SpO2 99 % 05/24/20 1057  Vitals shown include unvalidated device data.  Last Pain:  Vitals:   05/24/20 0912  TempSrc: Oral  PainSc: 5       Patients Stated Pain Goal: 0 (37/09/64 3838)  Complications: No complications documented.

## 2020-05-25 ENCOUNTER — Encounter: Payer: Self-pay | Admitting: Internal Medicine

## 2020-05-25 ENCOUNTER — Other Ambulatory Visit: Payer: Self-pay

## 2020-05-25 LAB — SURGICAL PATHOLOGY

## 2020-05-25 MED ORDER — PANTOPRAZOLE SODIUM 40 MG PO TBEC: 40.0000 mg | DELAYED_RELEASE_TABLET | Freq: Every day | ORAL | 2 refills | Status: DC

## 2020-05-28 ENCOUNTER — Ambulatory Visit (HOSPITAL_COMMUNITY): Payer: BC Managed Care – PPO | Admitting: Hematology

## 2020-05-28 NOTE — Discharge Summary (Signed)
Physician Discharge Summary  Sonia [name] AVW:[id number] DOB: 03/14/1986 DOA: 05/18/2020  PCP: Wynn Banker, MD  Admit date: 05/18/2020 Discharge date: 05/28/2020  Recommendations for Outpatient Follow-up:  1. Discharge to home 2. Follow up with PCP in 7-10 days 3. Follow up with GI as directed.   Follow-up Information    Sonia Dare, MD Follow up on 06/18/2020.   Specialty: Gastroenterology Why: 9:10 Am follow up of abdominal pain, pancreatitis and Crohn's disease.   Contact information: [address] [place] [address] [phone]               Discharge Diagnoses: Principal diagnosis is #1 1. Acute pancreatitis 2. History of Crohn's disease in remission 3. History of asthma - stable 4. Iron deficiency anemia  Discharge Condition: Fair  Disposition: Home  Diet recommendation: Regular  Filed Weights   05/18/20 1648 05/19/20 0903 05/19/20 2132  Weight: 70.3 kg 71.4 kg 71.4 kg    History of present illness: Sonia [name] is a 35 y.o. female with history of Crohn's disease in remission follows up with [facility] gastroenterologist Dr. Russella [name] has been experiencing epigastric pain radiating to the back and left side of the chest for the last 1 week had gone to the ER on Monday 5 days ago at that time labs showed elevated lipase and CT scan showed features concerning for pancreatitis.  Patient was discharged home on pain medication.  Patient comes back to the ER because of worsening pain.  Denies nausea vomiting or diarrhea.  Denies any fever chills.  Patient states he drinks alcohol usually 4-5 beers in the weekend.  ED Course: In the ER repeat labs show elevated lipase normal LFTs.  Patient is started on IV fluids pain relief medication and admitted for acute pancreatitis.  Hospital Course: Sonia Blankinshipis a 35 y.o.femalewithhistory of Crohn's disease in remission follows up with  gastroenterologist Dr. Russella [name] has been  experiencing epigastric pain radiating to the back and left side of the chest for the last 1 week had gone to the ER on Monday 5 days ago at that time labs showed elevated lipase and CT scan showed features concerning for pancreatitis. Patient was discharged home on pain medication. Patient comes back to the ER because of worsening pain. Denies nausea vomiting or diarrhea. Denies any fever chills. Patient states he drinks alcohol usually 4-5 beers in the weekend.  ED Course:In the ER repeat labs show elevated lipase normal LFTs. Patient is started on IV fluids pain relief medication and admitted for acute pancreatitis.  MRCP was obtained and demonstrated pancreatitis as well as accumulation of hemosiderin in the liver and spleen. The patient does recall having an iron infusion in October due to iron deficiency anemia. No family history of hemochromatosis. Gastroenterology has been consulted. 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 Supportive care.  Pt has ongoing complaints of abdominal pain. Repeat CT abdomen is negative for any sign of pancreatitis. Right upper quadrant ultrasound is also negative for possible source of pain.   The patient does complain of constipation. Dr. Rhea [name] has examined the patient and he has ordered a suppository. He had cleared her for discharge following BM. She proceeded to have BM x 2.   However this morning the patient was crying and holding her belly and demanding that EGD be performed prior to discharge. Dr. Rhea [name] came to re-examine the patient and agreed to peform EGD. However, no anesthesia was available to perform the procedure today.  Upper endoscopy was obtained on the morning of 05/24/2020. It did not demonstrate an etiology for the patient's pain. She was cleared for discharge to home by GI. She was discharged to home in good condition on 05/24/2020.  Today's assessment: S: The patient is resting comfortably. No new  complaints. O: Vitals:  Vitals:   05/24/20 1115 05/24/20 1130  BP: 114/73 117/70  Pulse: 68 65  Resp: 13 17  Temp:  98.6 F (37 C)  SpO2: 100%    Exam:  Constitutional:   The patient is awake, alert, and oriented x 3. No peritoneal signs as patient is ambulating about the room without difficulty. She is holding her abdomen and crying. Respiratory:   No increased work of breathing.  No wheezes, rales, or rhonchi  No tactile fremitus Cardiovascular:   Regular rate and rhythm  No murmurs, ectopy, or gallups.  No lateral PMI. No thrills. Abdomen:   Abdomen is distended and tender diffusely, although soft. No peritoneal signs  No hernias, masses, or organomegaly  Normoactive active bowel sounds.  Musculoskeletal:   No cyanosis, clubbing, or edema Skin:   No rashes, lesions, ulcers  palpation of skin: no induration or nodules Neurologic:   CN 2-12 intact  Sensation all 4 extremities intact Psychiatric:   Mental status ? Mood, affect appropriate ? Orientation to person, place, time   judgment and insight appear intact  Discharge Instructions  Discharge Instructions    Activity as tolerated - No restrictions   Complete by: As directed    Call MD for:  persistant nausea and vomiting   Complete by: As directed    Call MD for:  severe uncontrolled pain   Complete by: As directed    Diet - low sodium heart healthy   Complete by: As directed    Discharge instructions   Complete by: As directed    Discharge to home Follow up with PCP in 7-10 days Follow up with GI as directed.   Increase activity slowly   Complete by: As directed      Allergies as of 05/24/2020      Reactions   Other Other (See Comments)   Mold, mildew, grass, fungus, trees cause swelling of eyes      Medication List    STOP taking these medications   cetirizine-pseudoephedrine 5-120 MG tablet Commonly known as: ZYRTEC-D   HYDROcodone-acetaminophen 5-325 MG tablet Commonly  known as: NORCO/VICODIN     TAKE these medications   albuterol 108 (90 Base) MCG/ACT inhaler Commonly known as: VENTOLIN HFA Inhale 1-2 puffs into the lungs every 4 (four) hours as needed for wheezing or shortness of breath.   budesonide-formoterol 80-4.5 MCG/ACT inhaler Commonly known as: SYMBICORT Inhale 2 puffs into the lungs 2 (two) times a day.   buPROPion 150 MG 12 hr tablet Commonly known as: WELLBUTRIN SR TAKE 1 TABLET BY MOUTH TWICE A DAY   cetirizine 10 MG tablet Commonly known as: ZYRTEC Take 10 mg by mouth daily.   dicyclomine 20 MG tablet Commonly known as: BENTYL Take 1 tablet (20 mg total) by mouth 3 (three) times daily.   diphenhydrAMINE 25 mg capsule Commonly known as: BENADRYL Take 25 mg by mouth at bedtime as needed for allergies. For itching   escitalopram 10 MG tablet Commonly known as: LEXAPRO TAKE 1 TABLET (10 MG TOTAL) BY MOUTH IN THE MORNING AND AT BEDTIME.   montelukast 10 MG tablet Commonly known as: SINGULAIR Take 10 mg by mouth at bedtime.  ofloxacin 0.3 % ophthalmic solution Commonly known as: Ocuflox Place 1 drop into the right eye 4 (four) times daily.   Olopatadine HCl 0.2 % Soln Apply 1 drop to eye daily as needed.   ondansetron 4 MG disintegrating tablet Commonly known as: ZOFRAN-ODT Take 1 tablet (4 mg total) by mouth every 8 (eight) hours as needed for nausea or vomiting.   oxyCODONE 5 MG immediate release tablet Commonly known as: Oxy IR/ROXICODONE Take 1-2 tablets (5-10 mg total) by mouth every 4 (four) hours as needed for severe pain or moderate pain.   polyethylene glycol 17 g packet Commonly known as: MIRALAX / GLYCOLAX Take 17 g by mouth daily.   traZODone 50 MG tablet Commonly known as: DESYREL Take 1 tablet (50 mg total) by mouth at bedtime.      Allergies  Allergen Reactions  . Other Other (See Comments)    Mold, mildew, grass, fungus, trees cause swelling of eyes    The results of significant  diagnostics from this hospitalization (including imaging, microbiology, ancillary and laboratory) are listed below for reference.    Significant Diagnostic Studies: CT ABDOMEN PELVIS W CONTRAST  Result Date: 05/21/2020 CLINICAL DATA:  Epigastric and abdominal pain question pancreatitis, history of Crohn's disease, GERD EXAM: CT ABDOMEN AND PELVIS WITH CONTRAST TECHNIQUE: Multidetector CT imaging of the abdomen and pelvis was performed using the standard protocol following bolus administration of intravenous contrast. Sagittal and coronal MPR images reconstructed from axial data set. CONTRAST:  OMNIPAQUE IOHEXOL 300 MG/ML SOLN IV. Dilute oral contrast. COMPARISON:  05/15/2020 FINDINGS: Lower chest: Lung bases clear Hepatobiliary: Gallbladder and liver normal appearance. No biliary dilatation. Pancreas: Normal appearance. No peripancreatic edema, pancreatic mass, fluid collection or calcification. Spleen: Normal appearance Adrenals/Urinary Tract: Adrenal glands, kidneys, ureters, and bladder normal appearance Stomach/Bowel: Stomach and bowel loops normal appearance. Appendix not visualized. Vascular/Lymphatic: Vascular structures patent.  No adenopathy. Reproductive: Decreased size of cyst seen within LEFT ovary on the previous exam. Small amount of fluid within endometrial canal, unchanged. Other: No free air or free fluid. No hernia or inflammatory process. Musculoskeletal: Unremarkable IMPRESSION: No acute intra-abdominal or intrapelvic abnormalities. Specifically, no CT evidence of acute pancreatitis. Electronically Signed   By: Ulyses Southward M.D.   On: 05/21/2020 15:42   CT ABDOMEN PELVIS W CONTRAST  Result Date: 05/15/2020 CLINICAL DATA:  Upper abdominal pain EXAM: CT ABDOMEN AND PELVIS WITH CONTRAST TECHNIQUE: Multidetector CT imaging of the abdomen and pelvis was performed using the standard protocol following bolus administration of intravenous contrast. CONTRAST:  OMNIPAQUE IOHEXOL 300  MG/ML  SOLN COMPARISON:  None. FINDINGS: Lower chest: The visualized heart size within normal limits. No pericardial fluid/thickening. No hiatal hernia. The visualized portions of the lungs are clear. Hepatobiliary: The liver is normal in density without focal abnormality.The main portal vein is patent. No evidence of calcified gallstones, gallbladder wall thickening or biliary dilatation. Pancreas: There is minimal fat stranding changes seen around the pancreatic head and proximal body with mild pancreatic ductal dilatation measuring up to 4 mm. No loculated fluid collections or free air is seen. Spleen: Normal in size without focal abnormality. Adrenals/Urinary Tract: Both adrenal glands appear normal. The kidneys and collecting system appear normal without evidence of urinary tract calculus or hydronephrosis. Bladder is unremarkable. Stomach/Bowel: The stomach, small bowel, and colon are normal in appearance. No inflammatory changes, wall thickening, or obstructive findings.The appendix is normal. Vascular/Lymphatic: There are no enlarged mesenteric, retroperitoneal, or pelvic lymph nodes. No significant vascular findings are  present. Reproductive: There is a 3.2 cm low-density lesion within the left adnexa, likely ovarian cyst. A small amount of fluid seen within the endometrial canal. Other: No evidence of abdominal wall mass or hernia. Musculoskeletal: No acute or significant osseous findings. IMPRESSION: Findings which may be suggestive of acute mild pancreatitis and mild pancreatic ductal dilatation. No loculated fluid collection or evidence of pancreatic necrosis. Electronically Signed   By: Jonna ClarkBindu  Avutu M.D.   On: 05/15/2020 00:49   MR ABDOMEN WITH MRCP W CONTRAST  Result Date: 05/19/2020 CLINICAL DATA:  35 year old female with history of severe acute pancreatitis. History of Crohn's disease in remission. Epigastric pain radiating to the back and left side. EXAM: MRI ABDOMEN WITH CONTRAST (WITH MRCP)  TECHNIQUE: Multiplanar multisequence MR imaging of the abdomen was performed following the administration of intravenous contrast. Heavily T2-weighted images of the biliary and pancreatic ducts were obtained, and three-dimensional MRCP images were rendered by post processing. CONTRAST:  7mL GADAVIST GADOBUTROL 1 MMOL/ML IV SOLN COMPARISON:  Abdominal MRI 03/11/2018. FINDINGS: Lower chest: Unremarkable. Hepatobiliary: Diffuse loss of signal intensity throughout the hepatic parenchyma on in phase dual echo images, as well as diffuse low signal intensity on T2 weighted images, indicative of hepatic iron deposition. This is slightly heterogeneous, with more focal iron deposition in segment 6 of the liver. No discrete cystic or solid hepatic lesions are otherwise noted. No intra or extrahepatic biliary ductal dilatation. Gallbladder is normal in appearance. Pancreas: No pancreatic mass. No pancreatic ductal dilatation. Small amount of peripancreatic T2 signal intensity indicates inflammation and trace amount of peripancreatic fluid. No well-defined peripancreatic fluid collection to suggest pseudocyst. Pancreatic parenchyma enhances normally without definitive regions of pancreatic necrosis. Spleen: Mild diffuse loss of signal intensity throughout the splenic parenchyma on in phase dual echo images, and mild T2 hypointensity, indicative of mild splenic iron deposition. Adrenals/Urinary Tract: Bilateral kidneys and adrenal glands are normal in appearance. No hydroureteronephrosis in the visualized portions of the abdomen. Stomach/Bowel: Visualized portions are unremarkable. Vascular/Lymphatic: No aneurysm identified in the visualized abdominal vasculature. No lymphadenopathy noted in the abdomen. Other: No significant volume of ascites noted in the visualized portions of the peritoneal cavity. Musculoskeletal: No aggressive appearing osseous lesions are noted in the visualized portions of the skeleton. IMPRESSION: 1.  Subtle inflammatory changes around the pancreas compatible with reported clinical history of pancreatitis. No evidence of pancreatic necrosis, and no pancreatic pseudocyst or other acute complicating features noted at this time. 2. Interval development of both hepatic and splenic iron deposition, as above. 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 Correlation with clinical history of blood transfusions or other cause of secondary hemochromatosis is recommended. Electronically Signed   By: Trudie Reedaniel  Entrikin M.D.   On: 05/19/2020 10:07   US Abdomen Limited RUQ (LIVER/GB)  Result Date: 05/22/2020 CLINICAL DATA:  Epigastric pain for the past 2 weeks. EXAM: ULTRASOUND ABDOMEN LIMITED RIGHT UPPER QUADRANT COMPARISON:  CT abdomen pelvis dated May 21, 2020. FINDINGS: Gallbladder: No gallstones or wall thickening visualized. No sonographic Murphy sign noted by sonographer. Common bile duct: Diameter: 6 mm, normal. Liver: No focal lesion identified. Within normal limits in parenchymal echogenicity. Portal vein is patent on color Doppler imaging with normal direction of blood flow towards the liver. Other: None. IMPRESSION: 1. Normal right upper quadrant ultrasound. Electronically Signed   By: Obie DredgeWilliam T Derry M.D.   On: 05/22/2020 15:18    Microbiology: Recent Results (from the past 240 hour(s))  SARS CORONAVIRUS 2 (TAT 6-24 HRS) Nasopharyngeal Nasopharyngeal Swab  Status: None   Collection Time: 05/19/20  6:11 AM   Specimen: Nasopharyngeal Swab  Result Value Ref Range Status   SARS Coronavirus 2 NEGATIVE NEGATIVE Final    Comment: (NOTE) SARS-CoV-2 target nucleic acids are NOT DETECTED.  The SARS-CoV-2 RNA is generally detectable in upper and lower respiratory specimens during the acute phase of infection. Negative results do not preclude SARS-CoV-2 infection, do not rule out co-infections with other pathogens, and should not be used as the sole basis for treatment or other patient  management decisions. Negative results must be combined with clinical observations, patient history, and epidemiological information. The expected result is Negative.  Fact Sheet for Patients: HairSlick.no  Fact Sheet for Healthcare Providers: quierodirigir.com  This test is not yet approved or cleared by the [place] FDA and  has been authorized for detection and/or diagnosis of SARS-CoV-2 by FDA under an Emergency Use Authorization (EUA). This EUA will remain  in effect (meaning this test can be used) for the duration of the COVID-19 declaration under Se ction 564(b)(1) of the Act, 21 U.S.C. section 360bbb-3(b)(1), unless the authorization is terminated or revoked sooner.  Performed at [facility], [address] [place], [place] [address]      Labs: Basic Metabolic Panel: No results for input(s): NA, K, CL, CO2, GLUCOSE, BUN, CREATININE, CALCIUM, MG, PHOS in the last 168 hours. Liver Function Tests: No results for input(s): AST, ALT, ALKPHOS, BILITOT, PROT, ALBUMIN in the last 168 hours. No results for input(s): LIPASE, AMYLASE in the last 168 hours. No results for input(s): AMMONIA in the last 168 hours. CBC: No results for input(s): WBC, NEUTROABS, HGB, HCT, MCV, PLT in the last 168 hours. Cardiac Enzymes: No results for input(s): CKTOTAL, CKMB, CKMBINDEX, TROPONINI in the last 168 hours. BNP: BNP (last 3 results) No results for input(s): BNP in the last 8760 hours.  ProBNP (last 3 results) No results for input(s): PROBNP in the last 8760 hours.  CBG: Recent Labs  Lab 05/22/20 1815 05/22/20 2352 05/23/20 0606 05/24/20 0103 05/24/20 0551  GLUCAP 97 115* 99 82 81    Principal Problem:   Acute pancreatitis Active Problems:   Crohn's disease (HCC)   Iron deficiency anemia due to chronic blood loss   Hypoglycemia without diagnosis of diabetes mellitus   Hemochromatosis   Abdominal pain,  epigastric   Time coordinating discharge: 38 minutes  Signed:        Rylei Masella, DO [facility]  05/28/2020, 1:53 PM

## 2020-05-31 ENCOUNTER — Inpatient Hospital Stay (HOSPITAL_COMMUNITY): Payer: BC Managed Care – PPO | Attending: Hematology | Admitting: Oncology

## 2020-05-31 ENCOUNTER — Other Ambulatory Visit: Payer: Self-pay

## 2020-05-31 DIAGNOSIS — D500 Iron deficiency anemia secondary to blood loss (chronic): Secondary | ICD-10-CM | POA: Diagnosis not present

## 2020-05-31 NOTE — Progress Notes (Signed)
[facility] [facility] [address] [place] [address]   CLINIC:  Medical Oncology/Hematology  Patient Care Team: Caren Macadam, MD as PCP - General (Family Medicine) Crawford Givens, MD as Consulting Physician (Obstetrics and Gynecology)   I connected with Sonia [name] on 05/31/20 at 11:10 AM EST by telephone visit and verified that I am speaking with the correct person using two identifiers.   I discussed the limitations, risks, security and privacy concerns of performing an evaluation and management service by telemedicine and the availability of in-person appointments. I also discussed with the patient that there may be a patient responsible charge related to this service. The patient expressed understanding and agreed to proceed.   Other persons participating in the visit and their role in the encounter: None  Patient's location: Home Provider's location: Clinic    CHIEF COMPLAINTS/PURPOSE OF CONSULTATION:  Evaluation of anemia  HISTORY OF PRESENTING ILLNESS:  Sonia [name] 35 y.o. female is here because of evaluation of anemia, at the request of Dr. Micheline Rough, MD from [facility]. Her hemoglobin on 8/4 was 11.4 and ferritin was 4.  She was seen in clinic on 02/14/2020 for consultation for anemia.  Hematology work-up included additional labs showing iron deficiency.  She was given 2 doses of IV iron on 02/20/2020 and 02/27/2020.  She has past medical history significant for COVID-19 back in April 2021, Crohn's disease, anxiety, depression and insomnia.  She was diagnosed with pancreatitis on 05/15/2020 by imaging and sent home with narcotics and antiemetics.  She presented back to emergency room for worsening abdominal pain and was hospitalized from 05/20/2019-05/29/2019.  MRCP was obtained and demonstrated pancreatitis as well as an accumulation of iron in her liver and spleen.  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  EGD did not reveal any acute abnormalities.  She is currently at home and has been feeling better.  Unfortunately, she now has COVID-19 for the second time.  States one of her children brought home to her.  She is doing well.  She denies any additional abdominal pain, fever, night sweats or unexplained weight loss.   MEDICAL HISTORY:  Past Medical History:  Diagnosis Date  . COVID-19   . Crohn's colitis ([place])   . Depression   . GERD (gastroesophageal reflux disease)    no meds  . Normal labor 03/27/2013  . Postpartum care following cesarean delivery (11/9) 03/27/2013    SURGICAL HISTORY: Past Surgical History:  Procedure Laterality Date  . BIOPSY  05/24/2020   Procedure: BIOPSY;  Surgeon: Jerene Bears, MD;  Location: [facility] ENDOSCOPY;  Service: Gastroenterology;;  . CESAREAN SECTION N/A 03/27/2013   Procedure: CESAREAN SECTION;  Surgeon: Lovenia Kim, MD;  Location: [facility];  Service: Obstetrics;  Laterality: N/A;  . CESAREAN SECTION N/A 09/04/2014   Procedure: REPEAT CESAREAN SECTION;  Surgeon: Crawford Givens, MD;  Location: [facility];  Service: Obstetrics;  Laterality: N/A;  . ESOPHAGOGASTRODUODENOSCOPY (EGD) WITH PROPOFOL N/A 05/24/2020   Procedure: ESOPHAGOGASTRODUODENOSCOPY (EGD) WITH PROPOFOL;  Surgeon: Jerene Bears, MD;  Location: [facility];  Service: Gastroenterology;  Laterality: N/A;  . HAND SURGERY  2012   with nerve block  . LAPAROSCOPIC ENDOMETRIOSIS FULGURATION    . WISDOM TOOTH EXTRACTION      SOCIAL HISTORY: Social History   Socioeconomic History  . Marital status: Married    Spouse name: Not on file  . Number  of children: 2  . Years of education: Not on file  . Highest education level: Not on file  Occupational History  . Occupation: stay at home mother  Tobacco Use  . Smoking status: Never Smoker  . Smokeless tobacco: Former Systems developer    Types: Secondary school teacher  . Vaping Use: Never used  Substance and Sexual  Activity  . Alcohol use: Yes    Comment: occasionally  . Drug use: No  . Sexual activity: Yes    Birth control/protection: None  Other Topics Concern  . Not on file  Social History Narrative  . Not on file   Social Determinants of Health   Financial Resource Strain: Not on file  Food Insecurity: Not on file  Transportation Needs: Not on file  Physical Activity: Not on file  Stress: Not on file  Social Connections: Not on file  Intimate Partner Violence: Not on file    FAMILY HISTORY: Family History  Problem Relation Age of Onset  . COPD Mother   . Heart disease Mother        smoker  . Hernia Mother   . Diverticulitis Mother   . Kidney disease Mother   . Heart failure Mother   . Stroke Mother 49  . Osteoporosis Mother   . Anxiety disorder Mother        ? on xanax  . Rheum arthritis Mother   . Heart disease Maternal Grandmother        smoker  . Arthritis-Osteo Maternal Grandmother   . Cancer Maternal Grandfather        throat  . Alcohol abuse Maternal Grandfather   . Other Father        pituitary gland tumor  . Depression Sister   . Anxiety disorder Sister   . Colon cancer Neg Hx     ALLERGIES:  is allergic to other.  MEDICATIONS:  Current Outpatient Medications  Medication Sig Dispense Refill  . albuterol (VENTOLIN HFA) 108 (90 Base) MCG/ACT inhaler Inhale 1-2 puffs into the lungs every 4 (four) hours as needed for wheezing or shortness of breath. 18 g 0  . budesonide-formoterol (SYMBICORT) 80-4.5 MCG/ACT inhaler Inhale 2 puffs into the lungs 2 (two) times a day.    Marland Kitchen buPROPion (WELLBUTRIN SR) 150 MG 12 hr tablet TAKE 1 TABLET BY MOUTH TWICE A DAY (Patient taking differently: Take 150 mg by mouth 2 (two) times daily.) 180 tablet 0  . cetirizine (ZYRTEC) 10 MG tablet Take 10 mg by mouth daily.    Marland Kitchen dicyclomine (BENTYL) 20 MG tablet Take 1 tablet (20 mg total) by mouth 3 (three) times daily. 90 tablet 0  . diphenhydrAMINE (BENADRYL) 25 mg capsule Take 25 mg by  mouth at bedtime as needed for allergies. For itching    . escitalopram (LEXAPRO) 10 MG tablet TAKE 1 TABLET (10 MG TOTAL) BY MOUTH IN THE MORNING AND AT BEDTIME. 180 tablet 0  . montelukast (SINGULAIR) 10 MG tablet Take 10 mg by mouth at bedtime.    Marland Kitchen ofloxacin (OCUFLOX) 0.3 % ophthalmic solution Place 1 drop into the right eye 4 (four) times daily. (Patient not taking: Reported on 05/19/2020) 5 mL 0  . Olopatadine HCl 0.2 % SOLN Apply 1 drop to eye daily as needed. (Patient not taking: Reported on 05/19/2020) 2.5 mL 0  . ondansetron (ZOFRAN-ODT) 4 MG disintegrating tablet Take 1 tablet (4 mg total) by mouth every 8 (eight) hours as needed for nausea or vomiting. (Patient not taking: Reported  on 05/19/2020) 8 tablet 0  . oxyCODONE (OXY IR/ROXICODONE) 5 MG immediate release tablet Take 1-2 tablets (5-10 mg total) by mouth every 4 (four) hours as needed for severe pain or moderate pain. 20 tablet 0  . pantoprazole (PROTONIX) 40 MG tablet Take 1 tablet (40 mg total) by mouth daily. 30 tablet 2  . polyethylene glycol (MIRALAX / GLYCOLAX) 17 g packet Take 17 g by mouth daily. 14 each 0  . traZODone (DESYREL) 50 MG tablet Take 1 tablet (50 mg total) by mouth at bedtime. 90 tablet 1   No current facility-administered medications for this visit.    REVIEW OF SYSTEMS:   Review of Systems  Constitutional: Positive for fatigue.  Respiratory: Positive for cough.      PHYSICAL EXAMINATION: ECOG PERFORMANCE STATUS: 0 - Asymptomatic  There were no vitals filed for this visit. There were no vitals filed for this visit. Physical Exam -Limited secondary to virtual visit. -Patient is able to speak in full sentences.  LABORATORY DATA:  I have reviewed the data as listed Recent Results (from the past 2160 hour(s))  COVID-19, Flu A+B and RSV ([facility])     Status: None   Collection Time: 04/11/20  3:18 PM  Result Value Ref Range   SARS-CoV-2, NAA Not Detected Not Detected    Comment: This nucleic acid  amplification test was developed and its performance characteristics determined by [employer]. Nucleic acid amplification tests include RT-PCR and TMA. This test has not been FDA cleared or approved. This test has been authorized by FDA under an Emergency Use Authorization (EUA). This test is only authorized for the duration of time the declaration that circumstances exist justifying the authorization of the emergency use of in vitro diagnostic tests for detection of SARS-CoV-2 virus and/or diagnosis of COVID-19 infection under section 564(b)(1) of the Act, 21 U.S.C. 578ION-6(E) (1), unless the authorization is terminated or revoked sooner. When diagnostic testing is negative, the possibility of a false negative result should be considered in the context of a patient's recent exposures and the presence of clinical signs and symptoms consistent with COVID-19. An individual without symptoms of COVID-19 and who is not shedding SARS-CoV-2 virus wo uld expect to have a negative (not detected) result in this assay.    Influenza A, NAA Not Detected Not Detected   Influenza B, NAA Not Detected Not Detected   RSV, NAA Not Detected Not Detected  CBC with Differential/Platelet     Status: None   Collection Time: 05/09/20  2:25 PM  Result Value Ref Range   WBC 5.9 4.0 - 10.5 K/uL   RBC 4.10 3.87 - 5.11 MIL/uL   Hemoglobin 12.0 12.0 - 15.0 g/dL   HCT 37.7 36.0 - 46.0 %   MCV 92.0 80.0 - 100.0 fL   MCH 29.3 26.0 - 34.0 pg   MCHC 31.8 30.0 - 36.0 g/dL   RDW 14.6 11.5 - 15.5 %   Platelets 296 150 - 400 K/uL   nRBC 0.0 0.0 - 0.2 %   Neutrophils Relative % 68 %   Neutro Abs 4.0 1.7 - 7.7 K/uL   Lymphocytes Relative 20 %   Lymphs Abs 1.2 0.7 - 4.0 K/uL   Monocytes Relative 8 %   Monocytes Absolute 0.5 0.1 - 1.0 K/uL   Eosinophils Relative 3 %   Eosinophils Absolute 0.2 0.0 - 0.5 K/uL   Basophils Relative 1 %   Basophils Absolute 0.1 0.0 - 0.1 K/uL   Immature Granulocytes 0 %  Abs  Immature Granulocytes 0.01 0.00 - 0.07 K/uL    Comment: Performed at [facility], [address], [place], [place] [address]  Ferritin     Status: None   Collection Time: 05/09/20  2:26 PM  Result Value Ref Range   Ferritin 81 11 - 307 ng/mL    Comment: Performed at [facility], [address] [place], [place] [address]  Iron and TIBC     Status: None   Collection Time: 05/09/20  2:26 PM  Result Value Ref Range   Iron 85 28 - 170 ug/dL   TIBC 279 250 - 450 ug/dL   Saturation Ratios 30 10.4 - 31.8 %   UIBC 194 ug/dL    Comment: Performed at [facility], [address] [place], [place] [address]  Lipase, blood     Status: Abnormal   Collection Time: 05/14/20  6:45 PM  Result Value Ref Range   Lipase 215 (H) 11 - 51 U/L    Comment: Performed at [facility], [address] [place], [place] [address]  Comprehensive metabolic panel     Status: Abnormal   Collection Time: 05/14/20  6:45 PM  Result Value Ref Range   Sodium 135 135 - 145 mmol/L   Potassium 3.9 3.5 - 5.1 mmol/L   Chloride 99 98 - 111 mmol/L   CO2 26 22 - 32 mmol/L   Glucose, Bld 106 (H) 70 - 99 mg/dL    Comment: Glucose reference range applies only to samples taken after fasting for at least 8 hours.   BUN 5 (L) 6 - 20 mg/dL   Creatinine, Ser 0.71 0.44 - 1.00 mg/dL   Calcium 8.9 8.9 - 10.3 mg/dL   Total Protein 7.0 6.5 - 8.1 g/dL   Albumin 4.4 3.5 - 5.0 g/dL   AST 21 15 - 41 U/L   ALT 23 0 - 44 U/L   Alkaline Phosphatase 28 (L) 38 - 126 U/L   Total Bilirubin 0.2 (L) 0.3 - 1.2 mg/dL   GFR, Estimated >60 >60 mL/min    Comment: (NOTE) Calculated using the CKD-EPI Creatinine Equation (2021)    Anion gap 10 5 - 15    Comment: Performed at [facility], [address] [place], [place] [address]  CBC     Status: None   Collection Time: 05/14/20  6:45 PM  Result Value Ref Range   WBC 7.9 4.0 - 10.5 K/uL   RBC 4.40 3.87 - 5.11 MIL/uL   Hemoglobin 12.9 12.0 - 15.0 g/dL   HCT 39.6 36.0 - 46.0 %    MCV 90.0 80.0 - 100.0 fL   MCH 29.3 26.0 - 34.0 pg   MCHC 32.6 30.0 - 36.0 g/dL   RDW 14.3 11.5 - 15.5 %   Platelets 337 150 - 400 K/uL   nRBC 0.0 0.0 - 0.2 %    Comment: Performed at [facility], [address] [place], [place] [address]  Urinalysis, Routine w reflex microscopic Urine, Clean Catch     Status: None   Collection Time: 05/14/20  6:45 PM  Result Value Ref Range   Color, Urine YELLOW YELLOW   APPearance CLEAR CLEAR   Specific Gravity, Urine 1.010 1.005 - 1.030   pH 6.5 5.0 - 8.0   Glucose, UA NEGATIVE NEGATIVE mg/dL   Hgb urine dipstick NEGATIVE NEGATIVE   Bilirubin Urine NEGATIVE NEGATIVE   Ketones, ur NEGATIVE NEGATIVE mg/dL   Protein, ur  NEGATIVE NEGATIVE mg/dL   Nitrite NEGATIVE NEGATIVE   Leukocytes,Ua NEGATIVE NEGATIVE    Comment: Microscopic not done on urines with negative protein, blood, leukocytes, nitrite, or glucose < 500 mg/dL. Performed at [facility], [address] [place], [place] [address]   Pregnancy, urine     Status: None   Collection Time: 05/14/20  6:45 PM  Result Value Ref Range   Preg Test, Ur NEGATIVE NEGATIVE    Comment:        THE SENSITIVITY OF THIS METHODOLOGY IS >20 mIU/mL. Performed at [facility], [address] [place], [place] [address]   Lipase, blood     Status: Abnormal   Collection Time: 05/18/20  5:04 PM  Result Value Ref Range   Lipase 267 (H) 11 - 51 U/L    Comment: Performed at [facility] [address], [place], [place] [address]  Comprehensive metabolic panel     Status: Abnormal   Collection Time: 05/18/20  5:04 PM  Result Value Ref Range   Sodium 136 135 - 145 mmol/L   Potassium 4.7 3.5 - 5.1 mmol/L   Chloride 99 98 - 111 mmol/L   CO2 30 22 - 32 mmol/L   Glucose, Bld 102 (H) 70 - 99 mg/dL    Comment: Glucose reference range applies only to samples taken after fasting for at least 8 hours.   BUN <5 (L) 6 - 20 mg/dL   Creatinine, Ser 0.75 0.44 - 1.00 mg/dL    Calcium 9.6 8.9 - 10.3 mg/dL   Total Protein 6.5 6.5 - 8.1 g/dL   Albumin 3.9 3.5 - 5.0 g/dL   AST 19 15 - 41 U/L   ALT 25 0 - 44 U/L   Alkaline Phosphatase 30 (L) 38 - 126 U/L   Total Bilirubin 0.5 0.3 - 1.2 mg/dL   GFR, Estimated >60 >60 mL/min    Comment: (NOTE) Calculated using the CKD-EPI Creatinine Equation (2021)    Anion gap 7 5 - 15    Comment: Performed at [facility] [address], [place] [address]  CBC     Status: None   Collection Time: 05/18/20  5:04 PM  Result Value Ref Range   WBC 8.0 4.0 - 10.5 K/uL   RBC 4.30 3.87 - 5.11 MIL/uL   Hemoglobin 12.9 12.0 - 15.0 g/dL   HCT 38.3 36.0 - 46.0 %   MCV 89.1 80.0 - 100.0 fL   MCH 30.0 26.0 - 34.0 pg   MCHC 33.7 30.0 - 36.0 g/dL   RDW 13.7 11.5 - 15.5 %   Platelets 297 150 - 400 K/uL   nRBC 0.0 0.0 - 0.2 %    Comment: Performed at [facility], [place] [address] [place], [place] [address]  Urinalysis, Routine w reflex microscopic Urine, Clean Catch     Status: Abnormal   Collection Time: 05/18/20  5:21 PM  Result Value Ref Range   Color, Urine YELLOW YELLOW   APPearance CLOUDY (A) CLEAR   Specific Gravity, Urine 1.004 (L) 1.005 - 1.030   pH 8.0 5.0 - 8.0   Glucose, UA NEGATIVE NEGATIVE mg/dL   Hgb urine dipstick NEGATIVE NEGATIVE   Bilirubin Urine NEGATIVE NEGATIVE   Ketones, ur NEGATIVE NEGATIVE mg/dL   Protein, ur NEGATIVE NEGATIVE mg/dL   Nitrite NEGATIVE NEGATIVE   Leukocytes,Ua MODERATE (A) NEGATIVE   RBC / HPF 0-5 0 - 5 RBC/hpf   WBC,  UA 21-50 0 - 5 WBC/hpf   Bacteria, UA MANY (A) NONE SEEN   Squamous Epithelial / LPF 11-20 0 - 5    Comment: Performed at [facility], [place] [address], [place], [place] [address]  Pregnancy, urine     Status: None   Collection Time: 05/19/20  4:40 AM  Result Value Ref Range   Preg Test, Ur NEGATIVE NEGATIVE    Comment: Performed at [facility] [address] [place], [place] [address]  SARS CORONAVIRUS 2 (TAT 6-24 HRS) Nasopharyngeal  Nasopharyngeal Swab     Status: None   Collection Time: 05/19/20  6:11 AM   Specimen: Nasopharyngeal Swab  Result Value Ref Range   SARS Coronavirus 2 NEGATIVE NEGATIVE    Comment: (NOTE) SARS-CoV-2 target nucleic acids are NOT DETECTED.  The SARS-CoV-2 RNA is generally detectable in upper and lower respiratory specimens during the acute phase of infection. Negative results do not preclude SARS-CoV-2 infection, do not rule out co-infections with other pathogens, and should not be used as the sole basis for treatment or other patient management decisions. Negative results must be combined with clinical observations, patient history, and epidemiological information. The expected result is Negative.  Fact Sheet for Patients: SugarRoll.be  Fact Sheet for Healthcare Providers: https://www.woods-mathews.com/  This test is not yet approved or cleared by the [place] FDA and  has been authorized for detection and/or diagnosis of SARS-CoV-2 by FDA under an Emergency Use Authorization (EUA). This EUA will remain  in effect (meaning this test can be used) for the duration of the COVID-19 declaration under Se ction 564(b)(1) of the Act, 21 U.S.C. section 360bbb-3(b)(1), unless the authorization is terminated or revoked sooner.  Performed at [facility], [place] [address] [place], [place] [address]   HIV Antibody (routine testing w rflx)     Status: None   Collection Time: 05/19/20  6:19 AM  Result Value Ref Range   HIV Screen 4th Generation wRfx Non Reactive Non Reactive    Comment: Performed at [facility], [place] [address], [place], [place] [address]  Hepatic function panel     Status: Abnormal   Collection Time: 05/19/20  6:19 AM  Result Value Ref Range   Total Protein 5.8 (L) 6.5 - 8.1 g/dL   Albumin 3.5 3.5 - 5.0 g/dL   AST 18 15 - 41 U/L   ALT 19 0 - 44 U/L   Alkaline Phosphatase 27 (L) 38 - 126 U/L   Total Bilirubin 0.6  0.3 - 1.2 mg/dL   Bilirubin, Direct 0.2 0.0 - 0.2 mg/dL   Indirect Bilirubin 0.4 0.3 - 0.9 mg/dL    Comment: Performed at [facility] [address], [place], [place] [address]  Basic metabolic panel     Status: Abnormal   Collection Time: 05/19/20  6:19 AM  Result Value Ref Range   Sodium 136 135 - 145 mmol/L   Potassium 4.2 3.5 - 5.1 mmol/L   Chloride 103 98 - 111 mmol/L   CO2 23 22 - 32 mmol/L   Glucose, Bld 90 70 - 99 mg/dL    Comment: Glucose reference range applies only to samples taken after fasting for at least 8 hours.   BUN <5 (L) 6 - 20 mg/dL   Creatinine, Ser 0.63 0.44 - 1.00 mg/dL   Calcium 8.7 (L) 8.9 - 10.3 mg/dL   GFR, Estimated >60 >60 mL/min    Comment: (NOTE) Calculated using the  CKD-EPI Creatinine Equation (2021)    Anion gap 10 5 - 15    Comment: Performed at [facility], [place] [address], [place], [place] [address]  CBC with Differential/Platelet     Status: Abnormal   Collection Time: 05/19/20  6:19 AM  Result Value Ref Range   WBC 7.6 4.0 - 10.5 K/uL   RBC 3.99 3.87 - 5.11 MIL/uL   Hemoglobin 12.0 12.0 - 15.0 g/dL   HCT 35.6 (L) 36.0 - 46.0 %   MCV 89.2 80.0 - 100.0 fL   MCH 30.1 26.0 - 34.0 pg   MCHC 33.7 30.0 - 36.0 g/dL   RDW 13.5 11.5 - 15.5 %   Platelets 270 150 - 400 K/uL   nRBC 0.0 0.0 - 0.2 %   Neutrophils Relative % 71 %   Neutro Abs 5.5 1.7 - 7.7 K/uL   Lymphocytes Relative 17 %   Lymphs Abs 1.3 0.7 - 4.0 K/uL   Monocytes Relative 8 %   Monocytes Absolute 0.6 0.1 - 1.0 K/uL   Eosinophils Relative 3 %   Eosinophils Absolute 0.2 0.0 - 0.5 K/uL   Basophils Relative 1 %   Basophils Absolute 0.0 0.0 - 0.1 K/uL   Immature Granulocytes 0 %   Abs Immature Granulocytes 0.03 0.00 - 0.07 K/uL    Comment: Performed at [facility] [address], [place], [place] [address]  Triglycerides     Status: None   Collection Time: 05/19/20  6:19 AM  Result Value Ref Range   Triglycerides 93 <150 mg/dL    Comment: Performed at [facility] [address], [place], [place] [address]  Glucose, capillary     Status: Abnormal   Collection Time: 05/19/20  9:11 AM  Result Value Ref Range   Glucose-Capillary 67 (L) 70 - 99 mg/dL    Comment: Glucose reference range applies only to samples taken after fasting for at least 8 hours.  Tissue transglutaminase, IgG     Status: None   Collection Time: 05/19/20  2:13 PM  Result Value Ref Range   Tissue Transglut Ab <2 0 - 5 U/mL    Comment: (NOTE)                              Negative        0 - 5                              Weak Positive   6 - 9                              Positive           >9 Performed At: [facility] [place], [place] [id number] Rush Farmer MD [id number]   IgG 4     Status: None   Collection Time: 05/19/20  2:13 PM  Result Value Ref Range   IgG, Subclass 4 15 2  - 96 mg/dL    Comment: (NOTE) Performed At: [facility] [place], [place] [id number] Rush Farmer MD [id number]   IgA     Status: Abnormal   Collection Time: 05/19/20  2:13 PM  Result Value Ref Range   IgA 61 (L) 87 - 352 mg/dL    Comment: (NOTE) Performed At: [facility] [place], [place] [id number] Rush Farmer MD [id number]   ANCA Titers     Status: None   Collection Time: 05/19/20  3:50 PM  Result Value Ref Range   C-ANCA <1:20 Neg:<1:20 titer   P-ANCA <1:20 Neg:<1:20 titer    Comment: (NOTE) The presence of positive fluorescence exhibiting P-ANCA or C-ANCA patterns alone is not specific for the diagnosis of Wegener's Granulomatosis (WG) or microscopic polyangiitis. Decisions about treatment should not be based solely on ANCA IFA results.  The International ANCA Group Consensus recommends follow up testing of positive sera with both PR-3 and MPO-ANCA enzyme immunoassays. As many as 5% serum samples are positive only by EIA. Ref. AM J Clin Pathol 1999;111:507-513.    Atypical P-ANCA  titer <1:20 Neg:<1:20 titer    Comment: (NOTE) The atypical pANCA pattern has been observed in a significant percentage of patients with ulcerative colitis, primary sclerosing cholangitis and autoimmune hepatitis. Performed At: [facility] [place], [place] [id number] Rush Farmer MD VO:[id number]   Mpo/pr-3 (anca) antibodies     Status: None   Collection Time: 05/19/20  3:50 PM  Result Value Ref Range   Myeloperoxidase Abs <9.0 0.0 - 9.0 U/mL   ANCA Proteinase 3 <3.5 0.0 - 3.5 U/mL    Comment: (NOTE) Performed At: [facility] [place], [place] [id number] Rush Farmer MD FI:[id number]   Glucose, capillary     Status: Abnormal   Collection Time: 05/19/20  4:37 PM  Result Value Ref Range   Glucose-Capillary 67 (L) 70 - 99 mg/dL    Comment: Glucose reference range applies only to samples taken after fasting for at least 8 hours.  Glucose, capillary     Status: Abnormal   Collection Time: 05/19/20  9:33 PM  Result Value Ref Range   Glucose-Capillary 67 (L) 70 - 99 mg/dL    Comment: Glucose reference range applies only to samples taken after fasting for at least 8 hours.  Glucose, capillary     Status: Abnormal   Collection Time: 05/19/20 11:58 PM  Result Value Ref Range   Glucose-Capillary 64 (L) 70 - 99 mg/dL    Comment: Glucose reference range applies only to samples taken after fasting for at least 8 hours.  CBC with Differential/Platelet     Status: Abnormal   Collection Time: 05/20/20  4:30 AM  Result Value Ref Range   WBC 4.9 4.0 - 10.5 K/uL   RBC 3.98 3.87 - 5.11 MIL/uL   Hemoglobin 11.5 (L) 12.0 - 15.0 g/dL   HCT 35.7 (L) 36.0 - 46.0 %   MCV 89.7 80.0 - 100.0 fL   MCH 28.9 26.0 - 34.0 pg   MCHC 32.2 30.0 - 36.0 g/dL   RDW 13.2 11.5 - 15.5 %   Platelets 241 150 - 400 K/uL   nRBC 0.0 0.0 - 0.2 %   Neutrophils Relative % 54 %   Neutro Abs 2.7 1.7 - 7.7 K/uL   Lymphocytes Relative 30 %   Lymphs Abs 1.4 0.7 - 4.0 K/uL    Monocytes Relative 11 %   Monocytes Absolute 0.5 0.1 - 1.0 K/uL   Eosinophils Relative 4 %   Eosinophils Absolute 0.2 0.0 - 0.5 K/uL   Basophils Relative 1 %   Basophils Absolute 0.0 0.0 - 0.1 K/uL   Immature Granulocytes 0 %   Abs Immature Granulocytes 0.02 0.00 - 0.07 K/uL    Comment: Performed at [facility], [address] [place], [place] [address]  Comprehensive metabolic panel     Status: Abnormal   Collection Time: 05/20/20  4:30 AM  Result Value Ref Range   Sodium 135 135 - 145 mmol/L   Potassium 4.2 3.5 - 5.1 mmol/L   Chloride 101 98 - 111 mmol/L   CO2 27 22 - 32 mmol/L   Glucose, Bld 100 (H) 70 - 99 mg/dL    Comment: Glucose reference range applies only to samples taken after fasting for at least 8 hours.   BUN <5 (L) 6 - 20 mg/dL   Creatinine, Ser 0.71 0.44 - 1.00 mg/dL   Calcium 8.9 8.9 - 10.3 mg/dL   Total Protein 5.6 (L) 6.5 - 8.1 g/dL   Albumin 3.3 (L) 3.5 - 5.0 g/dL   AST 16 15 - 41 U/L   ALT 21 0 - 44 U/L   Alkaline Phosphatase 21 (L) 38 - 126 U/L   Total Bilirubin 0.5 0.3 - 1.2 mg/dL   GFR, Estimated >60 >60 mL/min    Comment: (NOTE) Calculated using the CKD-EPI Creatinine Equation (2021)    Anion gap 7 5 - 15    Comment: Performed at [facility], [place] [address], [place], [place] [address]  Lipase, blood     Status: Abnormal   Collection Time: 05/20/20  4:30 AM  Result Value Ref Range   Lipase 87 (H) 11 - 51 U/L    Comment: Performed at [facility] [address], [place], [place] [address]  Glucose, capillary     Status: Abnormal   Collection Time: 05/20/20 11:07 AM  Result Value Ref Range   Glucose-Capillary 103 (H) 70 - 99 mg/dL    Comment: Glucose reference range applies only to samples taken after fasting for at least 8 hours.  Glucose, capillary     Status: None   Collection Time: 05/20/20  4:26 PM  Result Value Ref Range   Glucose-Capillary 98 70 - 99 mg/dL    Comment: Glucose reference range applies only to samples  taken after fasting for at least 8 hours.  Glucose, capillary     Status: None   Collection Time: 05/21/20  2:19 AM  Result Value Ref Range   Glucose-Capillary 93 70 - 99 mg/dL    Comment: Glucose reference range applies only to samples taken after fasting for at least 8 hours.  CBC     Status: None   Collection Time: 05/21/20  6:52 AM  Result Value Ref Range   WBC 5.7 4.0 - 10.5 K/uL   RBC 4.26 3.87 - 5.11 MIL/uL   Hemoglobin 12.9 12.0 - 15.0 g/dL   HCT 36.5 36.0 - 46.0 %   MCV 85.7 80.0 - 100.0 fL   MCH 30.3 26.0 - 34.0 pg   MCHC 35.3 30.0 - 36.0 g/dL   RDW 13.2 11.5 - 15.5 %   Platelets 289 150 - 400 K/uL   nRBC 0.0 0.0 - 0.2 %    Comment: Performed at [facility], [place] [address] [place], [place] [address]  Basic metabolic panel Once     Status: Abnormal   Collection Time: 05/21/20  6:52 AM  Result Value Ref Range   Sodium 137 135 - 145 mmol/L   Potassium 4.2 3.5 - 5.1 mmol/L   Chloride 99 98 - 111 mmol/L   CO2 27 22 - 32 mmol/L   Glucose, Bld 89 70 - 99 mg/dL    Comment: Glucose reference range  applies only to samples taken after fasting for at least 8 hours.   BUN <5 (L) 6 - 20 mg/dL   Creatinine, Ser 0.66 0.44 - 1.00 mg/dL   Calcium 9.7 8.9 - 10.3 mg/dL   GFR, Estimated >60 >60 mL/min    Comment: (NOTE) Calculated using the CKD-EPI Creatinine Equation (2021)    Anion gap 11 5 - 15    Comment: Performed at [facility] [address] [place], [place] [address]  Lipase, blood     Status: Abnormal   Collection Time: 05/21/20  6:52 AM  Result Value Ref Range   Lipase 106 (H) 11 - 51 U/L    Comment: Performed at [facility] [address], [place], [place] [address]  Glucose, capillary     Status: None   Collection Time: 05/21/20 11:42 AM  Result Value Ref Range   Glucose-Capillary 91 70 - 99 mg/dL    Comment: Glucose reference range applies only to samples taken after fasting for at least 8 hours.  Glucose, capillary     Status: None    Collection Time: 05/21/20  5:03 PM  Result Value Ref Range   Glucose-Capillary 94 70 - 99 mg/dL    Comment: Glucose reference range applies only to samples taken after fasting for at least 8 hours.  Glucose, capillary     Status: None   Collection Time: 05/22/20  2:37 AM  Result Value Ref Range   Glucose-Capillary 84 70 - 99 mg/dL    Comment: Glucose reference range applies only to samples taken after fasting for at least 8 hours.  Glucose, capillary     Status: None   Collection Time: 05/22/20  7:42 AM  Result Value Ref Range   Glucose-Capillary 91 70 - 99 mg/dL    Comment: Glucose reference range applies only to samples taken after fasting for at least 8 hours.  Glucose, capillary     Status: None   Collection Time: 05/22/20 11:47 AM  Result Value Ref Range   Glucose-Capillary 94 70 - 99 mg/dL    Comment: Glucose reference range applies only to samples taken after fasting for at least 8 hours.  Glucose, capillary     Status: None   Collection Time: 05/22/20  6:15 PM  Result Value Ref Range   Glucose-Capillary 97 70 - 99 mg/dL    Comment: Glucose reference range applies only to samples taken after fasting for at least 8 hours.  Glucose, capillary     Status: Abnormal   Collection Time: 05/22/20 11:52 PM  Result Value Ref Range   Glucose-Capillary 115 (H) 70 - 99 mg/dL    Comment: Glucose reference range applies only to samples taken after fasting for at least 8 hours.   Comment 1 Notify RN    Comment 2 Document in Chart   Glucose, capillary     Status: None   Collection Time: 05/23/20  6:06 AM  Result Value Ref Range   Glucose-Capillary 99 70 - 99 mg/dL    Comment: Glucose reference range applies only to samples taken after fasting for at least 8 hours.   Comment 1 Notify RN    Comment 2 Document in Chart   Glucose, capillary     Status: None   Collection Time: 05/24/20  1:03 AM  Result Value Ref Range   Glucose-Capillary 82 70 - 99 mg/dL    Comment: Glucose reference range  applies only to samples taken after fasting for at least  8 hours.  Glucose, capillary     Status: None   Collection Time: 05/24/20  5:51 AM  Result Value Ref Range   Glucose-Capillary 81 70 - 99 mg/dL    Comment: Glucose reference range applies only to samples taken after fasting for at least 8 hours.  Surgical pathology     Status: None   Collection Time: 05/24/20 10:47 AM  Result Value Ref Range   SURGICAL PATHOLOGY      SURGICAL PATHOLOGY CASE: [id number] PATIENT: Lysbeth Penner Surgical Pathology Report     Clinical History: Epigastric pain (cm)     FINAL MICROSCOPIC DIAGNOSIS:  A. DUODENUM, BIOPSY: - No significant pathologic findings. - Negative for increased intraepithelial lymphocytes and villous architectural changes.  B. STOMACH, BIOPSY: - Mild chronic gastritis. - Warthin-Starry stain is negative for Helicobacter pylori.   GROSS DESCRIPTION:  A: Received in formalin are tan, soft tissue fragments that are submitted in toto. Number: 4 Size: 0.2-0.3 cm Blocks: 1  B: Received in formalin are tan, soft tissue fragments that are submitted in toto. Number: 6 Size: 0.3-0.7 cm Blocks: 1 (GRP 05/24/2020)   Final Diagnosis performed by Gillie Manners, MD.   Electronically signed 05/25/2020 Technical and / or Professional components performed at [facility]. [facility], [place] [address], [place], [place] [address].  Immunohistochemistry Technical component  (if applicable) was performed at [facility]. [address], [place], [place], [place] [address].   IMMUNOHISTOCHEMISTRY DISCLAIMER (if applicable): Some of these immunohistochemical stains may have been developed and the performance characteristics determine by [facility]. Some may not have been cleared or approved by the U.S. Food and Drug Administration. The FDA has determined that such clearance or approval is not necessary. This test is used for clinical  purposes. It should not be regarded as investigational or for research. This laboratory is certified under the [employer] (CLIA-88) as qualified to perform high complexity clinical laboratory testing.  The controls stained appropriately.     RADIOGRAPHIC STUDIES: I have personally reviewed the radiological images as listed and agreed with the findings in the report.  ASSESSMENT:  1.  Iron deficiency anemia: -CBC on 12/21/2019 with hemoglobin 11.4 and ferritin of 5. -Complains of dizzy spells for the last 3 months.  Also complains of feeling tired. -No fevers, night sweats or weight loss. -Covid infection in April 2021.  No prior history of transfusion or intravenous iron therapy.  2.  Crohn's disease: -Diagnosed in 2009. -She was treated with Pentasa and Bentyl in the past.  She takes Bentyl as needed. -Flareups include dull pain in the left upper quadrant, nausea, diarrhea and bleeding.  Last episode 3 years ago.  3.  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  She is currently staying at home.  Non-smoker. -Maternal grandfather had throat cancer.  2 maternal great aunts died of metastatic cancer.  PLAN:  1.  Iron deficiency anemia: Monitor height -She received IV Feraheme on 02/20/2020 and 02/27/2020.  -She was premedicated with Solu-Medrol secondary to multiple allergies.  - Labs from 05/09/2020 show a ferritin of 81 (4), iron saturation is 30% (5%) and a hemoglobin of 12 (12.4). -No additional iron at this time. -RTC 3 months with labs.  2. 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  - MRCP from 05/19/2020 showed hepatic and splenic iron deposition. 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 -Had  iron infusion on 02/20/2020 and 02/27/2020 -Spoke with Dr. Delton Coombes who states this is an expected finding after receiving IV iron.  As  long as her ferritin levels remain at a normal range, the iron deposits are harmless. -Repeat labs in 3 months.   Disposition: RTC in 3 months with labs (CBC, Iron panel, ferritin) and MD assessment.   I provided 15 minutes of non face-to-face telephone visit time during this encounter, and > 50% was spent counseling as documented under my assessment & plan.   All questions were answered. The patient knows to call the clinic with any problems, questions or concerns.  Faythe Casa, NP 05/31/2020 11:46 AM [place] [phone]

## 2020-06-03 ENCOUNTER — Other Ambulatory Visit: Payer: Self-pay | Admitting: Family Medicine

## 2020-06-03 DIAGNOSIS — F419 Anxiety disorder, unspecified: Secondary | ICD-10-CM

## 2020-06-04 ENCOUNTER — Ambulatory Visit: Payer: BC Managed Care – PPO | Admitting: Nurse Practitioner

## 2020-06-11 ENCOUNTER — Other Ambulatory Visit: Payer: Self-pay | Admitting: Family Medicine

## 2020-06-11 DIAGNOSIS — J3081 Allergic rhinitis due to animal (cat) (dog) hair and dander: Secondary | ICD-10-CM | POA: Diagnosis not present

## 2020-06-11 DIAGNOSIS — J301 Allergic rhinitis due to pollen: Secondary | ICD-10-CM | POA: Diagnosis not present

## 2020-06-11 DIAGNOSIS — J3089 Other allergic rhinitis: Secondary | ICD-10-CM | POA: Diagnosis not present

## 2020-06-12 ENCOUNTER — Other Ambulatory Visit: Payer: Self-pay | Admitting: Family Medicine

## 2020-06-18 ENCOUNTER — Encounter: Payer: Self-pay | Admitting: Gastroenterology

## 2020-06-18 ENCOUNTER — Ambulatory Visit: Payer: BC Managed Care – PPO | Admitting: Gastroenterology

## 2020-06-18 ENCOUNTER — Other Ambulatory Visit (INDEPENDENT_AMBULATORY_CARE_PROVIDER_SITE_OTHER): Payer: BC Managed Care – PPO

## 2020-06-18 ENCOUNTER — Other Ambulatory Visit: Payer: Self-pay

## 2020-06-18 VITALS — BP 100/60 | HR 76 | Ht 62.0 in | Wt 153.8 lb

## 2020-06-18 DIAGNOSIS — K5080 Crohn's disease of both small and large intestine without complications: Secondary | ICD-10-CM

## 2020-06-18 DIAGNOSIS — K8500 Idiopathic acute pancreatitis without necrosis or infection: Secondary | ICD-10-CM | POA: Diagnosis not present

## 2020-06-18 DIAGNOSIS — K5904 Chronic idiopathic constipation: Secondary | ICD-10-CM | POA: Diagnosis not present

## 2020-06-18 DIAGNOSIS — J3089 Other allergic rhinitis: Secondary | ICD-10-CM | POA: Diagnosis not present

## 2020-06-18 DIAGNOSIS — R1013 Epigastric pain: Secondary | ICD-10-CM

## 2020-06-18 DIAGNOSIS — J3081 Allergic rhinitis due to animal (cat) (dog) hair and dander: Secondary | ICD-10-CM | POA: Diagnosis not present

## 2020-06-18 DIAGNOSIS — J301 Allergic rhinitis due to pollen: Secondary | ICD-10-CM | POA: Diagnosis not present

## 2020-06-18 LAB — CBC WITH DIFFERENTIAL/PLATELET
Basophils Absolute: 0 10*3/uL (ref 0.0–0.1)
Basophils Relative: 0.7 % (ref 0.0–3.0)
Eosinophils Absolute: 0.1 10*3/uL (ref 0.0–0.7)
Eosinophils Relative: 1.9 % (ref 0.0–5.0)
HCT: 38.1 % (ref 36.0–46.0)
Hemoglobin: 12.5 g/dL (ref 12.0–15.0)
Lymphocytes Relative: 19.6 % (ref 12.0–46.0)
Lymphs Abs: 1.1 10*3/uL (ref 0.7–4.0)
MCHC: 32.9 g/dL (ref 30.0–36.0)
MCV: 88.3 fl (ref 78.0–100.0)
Monocytes Absolute: 0.4 10*3/uL (ref 0.1–1.0)
Monocytes Relative: 7.7 % (ref 3.0–12.0)
Neutro Abs: 4 10*3/uL (ref 1.4–7.7)
Neutrophils Relative %: 70.1 % (ref 43.0–77.0)
Platelets: 264 10*3/uL (ref 150.0–400.0)
RBC: 4.31 Mil/uL (ref 3.87–5.11)
RDW: 13.2 % (ref 11.5–15.5)
WBC: 5.7 10*3/uL (ref 4.0–10.5)

## 2020-06-18 LAB — COMPREHENSIVE METABOLIC PANEL
ALT: 33 U/L (ref 0–35)
AST: 24 U/L (ref 0–37)
Albumin: 4.4 g/dL (ref 3.5–5.2)
Alkaline Phosphatase: 27 U/L — ABNORMAL LOW (ref 39–117)
BUN: 8 mg/dL (ref 6–23)
CO2: 30 mEq/L (ref 19–32)
Calcium: 9.6 mg/dL (ref 8.4–10.5)
Chloride: 105 mEq/L (ref 96–112)
Creatinine, Ser: 0.7 mg/dL (ref 0.40–1.20)
GFR: 112.83 mL/min (ref >60.00)
Glucose, Bld: 96 mg/dL (ref 70–99)
Potassium: 4.2 mEq/L (ref 3.5–5.1)
Sodium: 138 mEq/L (ref 135–145)
Total Bilirubin: 0.6 mg/dL (ref 0.2–1.2)
Total Protein: 6.8 g/dL (ref 6.0–8.3)

## 2020-06-18 LAB — LIPASE: Lipase: 104 U/L — ABNORMAL HIGH (ref 11.0–59.0)

## 2020-06-18 MED ORDER — PLENVU 140 G PO SOLR: 1.0000 | Freq: Once | ORAL | 0 refills | Status: AC

## 2020-06-18 NOTE — Progress Notes (Signed)
    History of Present Illness: This is a 35 year old female returning for follow-up after hospitalization for acute pancreatitis 4 weeks ago.  Mild pancreatitis unclear etiology.  Gallbladder normal.  Patient denies heavy alcohol use.  No definite evidence for autoimmune pancreatitis with normal IgG4 and lack of classic autoimmune pancreatitis on imaging.  EGD on 1/7 was normal with gastric biopsies showing mild chronic gastritis and duodenal biopsies were normal. In early January MRI/MRCP showed subtle inflammatory changes around the pancreas, no other pancreatic abnormalities, hepatic and splenic iron deposition, CT AP was normal without evidence of pancreatitis, RUQ [place] was normal. She has a history of Crohn's ileocolitis that has been mild, on no medications.  Following discharge from the hospital she realized she had been constipated which was also contributing to her abdominal pain and back pain after taking laxatives her constipation resolved and her abdominal pain and back pain had completely resolved until about 3 to 4 days ago when she noticed mild midback pain recurring along with mild epigastric pain.  She states she has avoided alcohol since discharge from hospital.  Current Medications, Allergies, Past Medical History, Past Surgical History, Family History and Social History were reviewed in [employer] record.   Physical Exam: General: Well developed, well nourished, no acute distress Head: Normocephalic and atraumatic Eyes: Sclerae anicteric, EOMI Ears: Normal auditory acuity Mouth: Not examined, mask on during Covid-19 pandemic Lungs: Clear throughout to auscultation Heart: Regular rate and rhythm; no murmurs, rubs or bruits Abdomen: Soft, minimal epigastric tenderness. No masses, hepatosplenomegaly or hernias noted. Normal Bowel sounds Rectal: Not done Musculoskeletal: Symmetrical with no gross deformities  Pulses:  Normal pulses noted Extremities: No  clubbing, cyanosis, edema or deformities noted Neurological: Alert oriented x 4, grossly nonfocal Psychological:  Alert and cooperative. Normal mood and affect   Assessment and Recommendations:  1. Pancreatitis, acute, idiopathic. CMP, CBC, lipase, amylase today.  Begin a clear liquid diet for the next 2 days and advance gradually to a low-fat diet.  Continue to avoid all alcohol use.  She is advised to call if her symptoms do not resolve with the above measures.  REV in 6 weeks.  2. Crohn's ileocolitis.  No current symptoms.  Recommended reassessing disease with colonoscopy and she agrees.  Schedule colonoscopy. The risks (including bleeding, perforation, infection, missed lesions, medication reactions and possible hospitalization or surgery if complications occur), benefits, and alternatives to colonoscopy with possible biopsy and possible polypectomy were discussed with the patient and they consent to proceed.   3.  Constipation, currently inactive.  If symptoms recur begin MiraLAX daily.  Dicyclomine 20 mg p.o. TID as needed abdominal pain or back pain.  4.  Mild chronic gastritis.  Continue pantoprazole 40 mg daily.  5.  Hepatic and splenic iron deposition.  History of iron deficiency anemia 4 to 6 months ago with a ferritin of 4..  Ferritin had increased to 81 in December.

## 2020-06-18 NOTE — Patient Instructions (Signed)
Your provider has requested that you go to the basement level for lab work before leaving today. Press "B" on the elevator. The lab is located at the first door on the left as you exit the elevator.  Please remain on a clear liquid diet for several days until you can advance to a full liquid. When tolerated advance to regular diet.  You have been scheduled for a colonoscopy. Please follow written instructions given to you at your visit today.  Please pick up your prep supplies at the pharmacy within the next 1-3 days. If you use inhalers (even only as needed), please bring them with you on the day of your procedure.   Due to recent changes in healthcare laws, you may see the results of your imaging and laboratory studies on MyChart before your provider has had a chance to review them.  We understand that in some cases there may be results that are confusing or concerning to you. Not all laboratory results come back in the same time frame and the provider may be waiting for multiple results in order to interpret others.  Please give [place] 48 hours in order for your provider to thoroughly review all the results before contacting the office for clarification of your results.   Thank you for choosing me and [facility].  Venita Lick. Pleas Koch., MD., [facility] Graham

## 2020-06-26 DIAGNOSIS — Z124 Encounter for screening for malignant neoplasm of cervix: Secondary | ICD-10-CM | POA: Diagnosis not present

## 2020-06-26 DIAGNOSIS — Z113 Encounter for screening for infections with a predominantly sexual mode of transmission: Secondary | ICD-10-CM | POA: Diagnosis not present

## 2020-06-26 DIAGNOSIS — Z6827 Body mass index (BMI) 27.0-27.9, adult: Secondary | ICD-10-CM | POA: Diagnosis not present

## 2020-06-26 DIAGNOSIS — Z01419 Encounter for gynecological examination (general) (routine) without abnormal findings: Secondary | ICD-10-CM | POA: Diagnosis not present

## 2020-06-27 DIAGNOSIS — J3089 Other allergic rhinitis: Secondary | ICD-10-CM | POA: Diagnosis not present

## 2020-06-27 DIAGNOSIS — J301 Allergic rhinitis due to pollen: Secondary | ICD-10-CM | POA: Diagnosis not present

## 2020-06-27 DIAGNOSIS — J3081 Allergic rhinitis due to animal (cat) (dog) hair and dander: Secondary | ICD-10-CM | POA: Diagnosis not present

## 2020-07-11 DIAGNOSIS — J3089 Other allergic rhinitis: Secondary | ICD-10-CM | POA: Diagnosis not present

## 2020-07-11 DIAGNOSIS — J301 Allergic rhinitis due to pollen: Secondary | ICD-10-CM | POA: Diagnosis not present

## 2020-07-11 DIAGNOSIS — J3081 Allergic rhinitis due to animal (cat) (dog) hair and dander: Secondary | ICD-10-CM | POA: Diagnosis not present

## 2020-07-18 ENCOUNTER — Encounter: Payer: Self-pay | Admitting: Gastroenterology

## 2020-07-18 ENCOUNTER — Other Ambulatory Visit: Payer: Self-pay

## 2020-07-18 ENCOUNTER — Ambulatory Visit (AMBULATORY_SURGERY_CENTER): Payer: BC Managed Care – PPO | Admitting: Gastroenterology

## 2020-07-18 VITALS — BP 97/56 | HR 63 | Temp 98.9°F | Resp 13 | Ht 62.0 in | Wt 153.0 lb

## 2020-07-18 DIAGNOSIS — K640 First degree hemorrhoids: Secondary | ICD-10-CM | POA: Diagnosis not present

## 2020-07-18 DIAGNOSIS — J3089 Other allergic rhinitis: Secondary | ICD-10-CM | POA: Diagnosis not present

## 2020-07-18 DIAGNOSIS — J3081 Allergic rhinitis due to animal (cat) (dog) hair and dander: Secondary | ICD-10-CM | POA: Diagnosis not present

## 2020-07-18 DIAGNOSIS — K5080 Crohn's disease of both small and large intestine without complications: Secondary | ICD-10-CM

## 2020-07-18 DIAGNOSIS — J301 Allergic rhinitis due to pollen: Secondary | ICD-10-CM | POA: Diagnosis not present

## 2020-07-18 DIAGNOSIS — K5090 Crohn's disease, unspecified, without complications: Secondary | ICD-10-CM | POA: Diagnosis not present

## 2020-07-18 MED ORDER — SODIUM CHLORIDE 0.9 % IV SOLN: 500.0000 mL | Freq: Once | INTRAVENOUS | Status: DC

## 2020-07-18 NOTE — Progress Notes (Signed)
Called to room to assist during endoscopic procedure.  Patient ID and intended procedure confirmed with present staff. Received instructions for my participation in the procedure from the performing physician.  

## 2020-07-18 NOTE — Progress Notes (Signed)
To PACU, VSS. Report to [email or web address] 

## 2020-07-18 NOTE — Patient Instructions (Signed)
YOU HAD AN ENDOSCOPIC PROCEDURE TODAY AT THE [facility]:   Refer to the procedure report that was given to you for any specific questions about what was found during the examination.  If the procedure report does not answer your questions, please call your gastroenterologist to clarify.  If you requested that your care partner not be given the details of your procedure findings, then the procedure report has been included in a sealed envelope for you to review at your convenience later.  YOU SHOULD EXPECT: Some feelings of bloating in the abdomen. Passage of more gas than usual.  Walking can help get rid of the air that was put into your GI tract during the procedure and reduce the bloating. If you had a lower endoscopy (such as a colonoscopy or flexible sigmoidoscopy) you may notice spotting of blood in your stool or on the toilet paper. If you underwent a bowel prep for your procedure, you may not have a normal bowel movement for a few days.  Please Note:  You might notice some irritation and congestion in your nose or some drainage.  This is from the oxygen used during your procedure.  There is no need for concern and it should clear up in a day or so.  SYMPTOMS TO REPORT IMMEDIATELY:   Following lower endoscopy (colonoscopy or flexible sigmoidoscopy):  Excessive amounts of blood in the stool  Significant tenderness or worsening of abdominal pains  Swelling of the abdomen that is new, acute  Fever of 100F or higher   Following upper endoscopy (EGD)  Vomiting of blood or coffee ground material  New chest pain or pain under the shoulder blades  Painful or persistently difficult swallowing  New shortness of breath  Fever of 100F or higher  Black, tarry-looking stools  For urgent or emergent issues, a gastroenterologist can be reached at any hour by calling [phone]. Do not use MyChart messaging for urgent concerns.    DIET:  We do recommend a small meal at first, but  then you may proceed to your regular diet.  Drink plenty of fluids but you should avoid alcoholic beverages for 24 hours.  ACTIVITY:  You should plan to take it easy for the rest of today and you should NOT DRIVE or use heavy machinery until tomorrow (because of the sedation medicines used during the test).    FOLLOW UP: Our staff will call the number listed on your records 48-72 hours following your procedure to check on you and address any questions or concerns that you may have regarding the information given to you following your procedure. If we do not reach you, we will leave a message.  We will attempt to reach you two times.  During this call, we will ask if you have developed any symptoms of COVID 19. If you develop any symptoms (ie: fever, flu-like symptoms, shortness of breath, cough etc.) before then, please call [phone].  If you test positive for Covid 19 in the 2 weeks post procedure, please call and report this information to us.    If any biopsies were taken you will be contacted by phone or by letter within the next 1-3 weeks.  Please call us at [phone] if you have not heard about the biopsies in 3 weeks.    SIGNATURES/CONFIDENTIALITY: You and/or your care partner have signed paperwork which will be entered into your electronic medical record.  These signatures attest to the fact that that the information above on   your After Visit Summary has been reviewed and is understood.  Full responsibility of the confidentiality of this discharge information lies with you and/or your care-partner. 

## 2020-07-18 NOTE — Op Note (Signed)
[facility] Patient Name: Sonia [name] Procedure Date: 07/18/2020 2:19 PM MRN: [id number] Endoscopist: Ladene Artist , MD Age: 35 Referring MD:  Date of Birth: [birth year] Gender: Female Account #: [id number] Procedure:                Colonoscopy Indications:              Determine extent and severity of inflammatory bowel                            disease. History of Crohn's ileocolitis. Medicines:                Monitored Anesthesia Care Procedure:                Pre-Anesthesia Assessment:                           - Prior to the procedure, a History and Physical                            was performed, and patient medications and                            allergies were reviewed. The patient's tolerance of                            previous anesthesia was also reviewed. The risks                            and benefits of the procedure and the sedation                            options and risks were discussed with the patient.                            All questions were answered, and informed consent                            was obtained. Prior Anticoagulants: The patient has                            taken no previous anticoagulant or antiplatelet                            agents. ASA Grade Assessment: II - A patient with                            mild systemic disease. After reviewing the risks                            and benefits, the patient was deemed in                            satisfactory condition to undergo the procedure.  After obtaining informed consent, the colonoscope                            was passed under direct vision. Throughout the                            procedure, the patient's blood pressure, pulse, and                            oxygen saturations were monitored continuously. The                            Olympus [id number]-[id number] (#[id number]) Colonoscope was                            introduced through  the anus and advanced to the the                            terminal ileum, with identification of the                            appendiceal orifice and IC valve. The terminal                            ileum, ileocecal valve, appendiceal orifice, and                            rectum were photographed. The quality of the bowel                            preparation was excellent. The colonoscopy was                            performed without difficulty. The patient tolerated                            the procedure well. Scope In: 2:27:39 PM Scope Out: 2:40:07 PM Scope Withdrawal Time: 0 hours 9 minutes 36 seconds  Total Procedure Duration: 0 hours 12 minutes 28 seconds  Findings:                 The perianal and digital rectal examinations were                            normal.                           The terminal ileum appeared normal. Biopsies were                            taken with a cold forceps for histology.                           Internal hemorrhoids were found during  retroflexion. The hemorrhoids were small and Grade                            I (internal hemorrhoids that do not prolapse).                           The exam was otherwise without abnormality on                            direct and retroflexion views. Biopsies were taken                            with a cold forceps for histology. Complications:            No immediate complications. Estimated blood loss:                            None. Estimated Blood Loss:     Estimated blood loss: none. Impression:               - The examined portion of the ileum was normal.                            Biopsied.                           - Internal hemorrhoids.                           - The examination was otherwise normal on direct                            and retroflexion views. Biopsied. Recommendation:           - Repeat colonoscopy after studies are complete for                             surveillance based on pathology results.                           - Patient has a contact number available for                            emergencies. The signs and symptoms of potential                            delayed complications were discussed with the                            patient. Return to normal activities tomorrow.                            Written discharge instructions were provided to the                            patient.                           -  Resume previous diet.                           - Continue present medications.                           - Await pathology results. Ladene Artist, MD 07/18/2020 2:44:59 PM This report has been signed electronically.

## 2020-07-20 ENCOUNTER — Telehealth: Payer: Self-pay | Admitting: *Deleted

## 2020-07-20 ENCOUNTER — Telehealth: Payer: Self-pay

## 2020-07-20 NOTE — Telephone Encounter (Signed)
Attempted to reach pt. With follow-up call following endoscopy procedure 07/18/20.  LM on pt. Voice mail.  Will try to reach pt. Again later today.

## 2020-07-20 NOTE — Telephone Encounter (Signed)
  Follow up Call-  Call back number 07/18/2020  Post procedure Call Back phone  # [phone]  Permission to leave phone message Yes  Some recent data might be hidden     Patient questions:  Message left to call if necessary.   Second call.

## 2020-07-26 ENCOUNTER — Encounter: Payer: Self-pay | Admitting: Gastroenterology

## 2020-07-27 DIAGNOSIS — J3089 Other allergic rhinitis: Secondary | ICD-10-CM | POA: Diagnosis not present

## 2020-07-27 DIAGNOSIS — J301 Allergic rhinitis due to pollen: Secondary | ICD-10-CM | POA: Diagnosis not present

## 2020-07-27 DIAGNOSIS — J3081 Allergic rhinitis due to animal (cat) (dog) hair and dander: Secondary | ICD-10-CM | POA: Diagnosis not present

## 2020-08-19 ENCOUNTER — Other Ambulatory Visit: Payer: Self-pay | Admitting: Gastroenterology

## 2020-08-22 ENCOUNTER — Other Ambulatory Visit (HOSPITAL_COMMUNITY): Payer: Self-pay | Admitting: *Deleted

## 2020-08-22 DIAGNOSIS — D500 Iron deficiency anemia secondary to blood loss (chronic): Secondary | ICD-10-CM

## 2020-08-23 ENCOUNTER — Inpatient Hospital Stay (HOSPITAL_COMMUNITY): Payer: BC Managed Care – PPO | Attending: Hematology

## 2020-08-30 ENCOUNTER — Ambulatory Visit (HOSPITAL_COMMUNITY): Payer: BC Managed Care – PPO | Admitting: Hematology

## 2020-09-01 ENCOUNTER — Other Ambulatory Visit: Payer: Self-pay | Admitting: Family Medicine

## 2020-09-01 DIAGNOSIS — F419 Anxiety disorder, unspecified: Secondary | ICD-10-CM

## 2020-09-05 ENCOUNTER — Other Ambulatory Visit: Payer: Self-pay

## 2020-09-05 ENCOUNTER — Ambulatory Visit
Admission: EM | Admit: 2020-09-05 | Discharge: 2020-09-05 | Disposition: A | Discharge status: Home / Self Care | Payer: BC Managed Care – PPO | Attending: Emergency Medicine | Admitting: Emergency Medicine

## 2020-09-05 ENCOUNTER — Encounter: Payer: Self-pay | Admitting: Emergency Medicine

## 2020-09-05 DIAGNOSIS — J301 Allergic rhinitis due to pollen: Secondary | ICD-10-CM | POA: Diagnosis not present

## 2020-09-05 DIAGNOSIS — H02844 Edema of left upper eyelid: Secondary | ICD-10-CM | POA: Diagnosis not present

## 2020-09-05 DIAGNOSIS — H0289 Other specified disorders of eyelid: Secondary | ICD-10-CM | POA: Diagnosis not present

## 2020-09-05 DIAGNOSIS — H02841 Edema of right upper eyelid: Secondary | ICD-10-CM | POA: Diagnosis not present

## 2020-09-05 DIAGNOSIS — J3089 Other allergic rhinitis: Secondary | ICD-10-CM | POA: Diagnosis not present

## 2020-09-05 DIAGNOSIS — J3081 Allergic rhinitis due to animal (cat) (dog) hair and dander: Secondary | ICD-10-CM | POA: Diagnosis not present

## 2020-09-05 DIAGNOSIS — T7840XA Allergy, unspecified, initial encounter: Secondary | ICD-10-CM

## 2020-09-05 MED ORDER — PREDNISONE 20 MG PO TABS: 20.0000 mg | ORAL_TABLET | Freq: Two times a day (BID) | ORAL | 0 refills | Status: AC

## 2020-09-05 NOTE — ED Triage Notes (Signed)
Swelling , itching and pain to upper eye lids and puffiness under eyes.  pt reports she thinks she had an allergic reaction to her lash glue.

## 2020-09-05 NOTE — ED Provider Notes (Signed)
[place]   [id number] 09/05/20 Arrival Time: 4481  CC: Red eye  SUBJECTIVE:  Sonia [name] is a 35 y.o. female who presents with complaint of redness and swelling along bilateral eyelash lines x 1 day.  Symptoms began after having eyelash extensions.  Has tried OTC hydrocortisone without relief.  Symptoms are made worse to the touch.  Reports similar symptoms in the past that improved with hydrocortisone.  Denies fever, chills, nausea, vomiting, eye pain, painful eye movements, discharge, vision changes, double vision, FB sensation, periorbital erythema.     ROS: As per HPI.  All other pertinent ROS negative.     Past Medical History:  Diagnosis Date  . Allergy   . Anemia   . Anxiety   . Asthma   . Blood transfusion without reported diagnosis    iron infusion  . COVID-19   . Crohn's colitis ([place])   . Depression   . GERD (gastroesophageal reflux disease)    no meds  . Normal labor 03/27/2013  . Postpartum care following cesarean delivery (11/9) 03/27/2013   Past Surgical History:  Procedure Laterality Date  . BIOPSY  05/24/2020   Procedure: BIOPSY;  Surgeon: Jerene Bears, MD;  Location: [facility] ENDOSCOPY;  Service: Gastroenterology;;  . CESAREAN SECTION N/A 03/27/2013   Procedure: CESAREAN SECTION;  Surgeon: Lovenia Kim, MD;  Location: [facility];  Service: Obstetrics;  Laterality: N/A;  . CESAREAN SECTION N/A 09/04/2014   Procedure: REPEAT CESAREAN SECTION;  Surgeon: Crawford Givens, MD;  Location: [facility];  Service: Obstetrics;  Laterality: N/A;  . ESOPHAGOGASTRODUODENOSCOPY (EGD) WITH PROPOFOL N/A 05/24/2020   Procedure: ESOPHAGOGASTRODUODENOSCOPY (EGD) WITH PROPOFOL;  Surgeon: Jerene Bears, MD;  Location: [facility] ENDOSCOPY;  Service: Gastroenterology;  Laterality: N/A;  . HAND SURGERY  2012   with nerve block  . LAPAROSCOPIC ENDOMETRIOSIS FULGURATION    . WISDOM TOOTH EXTRACTION     Allergies  Allergen Reactions  . Other Other (See Comments)    Mold,  mildew, grass, fungus, trees cause swelling of eyes   No current facility-administered medications on file prior to encounter.   Current Outpatient Medications on File Prior to Encounter  Medication Sig Dispense Refill  . albuterol (VENTOLIN HFA) 108 (90 Base) MCG/ACT inhaler Inhale 1-2 puffs into the lungs every 4 (four) hours as needed for wheezing or shortness of breath. 18 g 0  . budesonide-formoterol (SYMBICORT) 80-4.5 MCG/ACT inhaler Inhale 2 puffs into the lungs 2 (two) times a day.    Marland Kitchen buPROPion (WELLBUTRIN SR) 150 MG 12 hr tablet TAKE 1 TABLET BY MOUTH TWICE A DAY 180 tablet 0  . cetirizine (ZYRTEC) 10 MG tablet Take 10 mg by mouth daily.    Marland Kitchen dicyclomine (BENTYL) 20 MG tablet Take 1 tablet (20 mg total) by mouth 3 (three) times daily. 90 tablet 0  . diphenhydrAMINE (BENADRYL) 25 mg capsule Take 25 mg by mouth at bedtime as needed for allergies. For itching    . escitalopram (LEXAPRO) 10 MG tablet TAKE 1 TABLET (10 MG TOTAL) BY MOUTH IN THE MORNING AND AT BEDTIME. 180 tablet 0  . montelukast (SINGULAIR) 10 MG tablet Take 10 mg by mouth at bedtime.    . pantoprazole (PROTONIX) 40 MG tablet TAKE 1 TABLET BY MOUTH EVERY DAY 90 tablet 3  . traZODone (DESYREL) 50 MG tablet TAKE 1 TABLET BY MOUTH EVERYDAY AT BEDTIME 90 tablet 1  . [DISCONTINUED] Norgestimate-Eth Estradiol (SPRINTEC 28 PO) Take by mouth.     Social History  Socioeconomic History  . Marital status: Married    Spouse name: Not on file  . Number of children: 2  . Years of education: Not on file  . Highest education level: Not on file  Occupational History  . Occupation: stay at home mother  Tobacco Use  . Smoking status: Never Smoker  . Smokeless tobacco: Former Systems developer    Types: Secondary school teacher  . Vaping Use: Never used  Substance and Sexual Activity  . Alcohol use: Yes    Comment: occasionally  . Drug use: No  . Sexual activity: Yes    Birth control/protection: None  Other Topics Concern  . Not on file   Social History Narrative  . Not on file   Social Determinants of Health   Financial Resource Strain: Not on file  Food Insecurity: Not on file  Transportation Needs: Not on file  Physical Activity: Not on file  Stress: Not on file  Social Connections: Not on file  Intimate Partner Violence: Not on file   Family History  Problem Relation Age of Onset  . COPD Mother   . Heart disease Mother        smoker  . Hernia Mother   . Diverticulitis Mother   . Kidney disease Mother   . Heart failure Mother   . Stroke Mother 63  . Osteoporosis Mother   . Anxiety disorder Mother        ? on xanax  . Rheum arthritis Mother   . Heart disease Maternal Grandmother        smoker  . Arthritis-Osteo Maternal Grandmother   . Cancer Maternal Grandfather        throat  . Alcohol abuse Maternal Grandfather   . Other Father        pituitary gland tumor  . Depression Sister   . Anxiety disorder Sister   . Crohn's disease Cousin   . Colon cancer Neg Hx   . Rectal cancer Neg Hx   . Stomach cancer Neg Hx   . Esophageal cancer Neg Hx     OBJECTIVE:  Vitals:   09/05/20 1146  BP: 105/71  Pulse: 73  Resp: 17  Temp: 98.1 F (36.7 C)  TempSrc: Oral  SpO2: 97%    General appearance: alert; no distress Eyes: erythema and swelling over eyelash lines bilaterally, fake eyelashes present; no conjunctival erythema. PERRL; EOMI without discomfort;  no obvious drainage Neck: supple Lungs: normal respiratory effort Skin: warm and dry Psychological: alert and cooperative; normal mood and affect  ASSESSMENT & PLAN:  1. Pain and swelling of upper eyelid of both eyes   2. Allergic reaction, initial encounter     Meds ordered this encounter  Medications  . predniSONE (DELTASONE) 20 MG tablet    Sig: Take 1 tablet (20 mg total) by mouth 2 (two) times daily with a meal for 5 days.    Dispense:  10 tablet    Refill:  0    Order Specific Question:   Supervising Provider    Answer:   Raylene Everts [[id number]]    Rest and apply ice Prednisone prescribed.  Take as directed and to completion Have eyelashes removed if symptoms persists Return here or follow up with ophthamolgy if symptoms persists or worsen such as fever, chills, redness, swelling, eye pain, painful eye movements, vision changes, etc...  Reviewed expectations re: course of current medical issues. Questions answered. Outlined signs and symptoms indicating need for more acute intervention. Patient  verbalized understanding. After Visit Summary given.   Lestine Box, PA-C 09/05/20 1220

## 2020-09-05 NOTE — Discharge Instructions (Signed)
Rest and apply ice Prednisone prescribed.  Take as directed and to completion Have eyelashes removed if symptoms persists Return here or follow up with ophthamolgy if symptoms persists or worsen such as fever, chills, redness, swelling, eye pain, painful eye movements, vision changes, etc..Marland Kitchen

## 2020-09-07 DIAGNOSIS — Z8742 Personal history of other diseases of the female genital tract: Secondary | ICD-10-CM | POA: Diagnosis not present

## 2020-09-08 ENCOUNTER — Other Ambulatory Visit: Payer: Self-pay | Admitting: Family Medicine

## 2020-09-12 ENCOUNTER — Telehealth: Payer: Self-pay | Admitting: Gastroenterology

## 2020-09-12 MED ORDER — PANTOPRAZOLE SODIUM 40 MG PO TBEC: 1.0000 | DELAYED_RELEASE_TABLET | Freq: Every day | ORAL | 3 refills | Status: DC

## 2020-09-12 NOTE — Telephone Encounter (Signed)
Inbound call from patient stating she did not pick up Protonix medication when it was sent to pharmacy and they are asking for [place] to resent it.  Please advise.

## 2020-09-12 NOTE — Telephone Encounter (Signed)
Prescription resent to patient's pharmacy.

## 2020-09-17 DIAGNOSIS — H1045 Other chronic allergic conjunctivitis: Secondary | ICD-10-CM | POA: Diagnosis not present

## 2020-09-17 DIAGNOSIS — J3089 Other allergic rhinitis: Secondary | ICD-10-CM | POA: Diagnosis not present

## 2020-09-17 DIAGNOSIS — J3081 Allergic rhinitis due to animal (cat) (dog) hair and dander: Secondary | ICD-10-CM | POA: Diagnosis not present

## 2020-09-17 DIAGNOSIS — J301 Allergic rhinitis due to pollen: Secondary | ICD-10-CM | POA: Diagnosis not present

## 2020-09-19 DIAGNOSIS — J301 Allergic rhinitis due to pollen: Secondary | ICD-10-CM | POA: Diagnosis not present

## 2020-09-19 DIAGNOSIS — J3089 Other allergic rhinitis: Secondary | ICD-10-CM | POA: Diagnosis not present

## 2020-09-19 DIAGNOSIS — J3081 Allergic rhinitis due to animal (cat) (dog) hair and dander: Secondary | ICD-10-CM | POA: Diagnosis not present

## 2020-10-02 ENCOUNTER — Encounter: Payer: Self-pay | Admitting: Family Medicine

## 2020-10-04 ENCOUNTER — Other Ambulatory Visit: Payer: Self-pay | Admitting: Family Medicine

## 2020-10-04 DIAGNOSIS — F419 Anxiety disorder, unspecified: Secondary | ICD-10-CM

## 2020-10-16 DIAGNOSIS — J3081 Allergic rhinitis due to animal (cat) (dog) hair and dander: Secondary | ICD-10-CM | POA: Diagnosis not present

## 2020-10-16 DIAGNOSIS — J3089 Other allergic rhinitis: Secondary | ICD-10-CM | POA: Diagnosis not present

## 2020-10-16 DIAGNOSIS — J301 Allergic rhinitis due to pollen: Secondary | ICD-10-CM | POA: Diagnosis not present

## 2020-10-29 DIAGNOSIS — J301 Allergic rhinitis due to pollen: Secondary | ICD-10-CM | POA: Diagnosis not present

## 2020-10-29 DIAGNOSIS — J3089 Other allergic rhinitis: Secondary | ICD-10-CM | POA: Diagnosis not present

## 2020-11-06 DIAGNOSIS — J3089 Other allergic rhinitis: Secondary | ICD-10-CM | POA: Diagnosis not present

## 2020-11-06 DIAGNOSIS — J301 Allergic rhinitis due to pollen: Secondary | ICD-10-CM | POA: Diagnosis not present

## 2020-11-06 DIAGNOSIS — J3081 Allergic rhinitis due to animal (cat) (dog) hair and dander: Secondary | ICD-10-CM | POA: Diagnosis not present

## 2020-11-16 DIAGNOSIS — J3081 Allergic rhinitis due to animal (cat) (dog) hair and dander: Secondary | ICD-10-CM | POA: Diagnosis not present

## 2020-11-16 DIAGNOSIS — J301 Allergic rhinitis due to pollen: Secondary | ICD-10-CM | POA: Diagnosis not present

## 2020-11-16 DIAGNOSIS — J3089 Other allergic rhinitis: Secondary | ICD-10-CM | POA: Diagnosis not present

## 2020-11-22 DIAGNOSIS — J3081 Allergic rhinitis due to animal (cat) (dog) hair and dander: Secondary | ICD-10-CM | POA: Diagnosis not present

## 2020-11-22 DIAGNOSIS — J3089 Other allergic rhinitis: Secondary | ICD-10-CM | POA: Diagnosis not present

## 2020-11-22 DIAGNOSIS — J301 Allergic rhinitis due to pollen: Secondary | ICD-10-CM | POA: Diagnosis not present

## 2020-12-11 DIAGNOSIS — J3081 Allergic rhinitis due to animal (cat) (dog) hair and dander: Secondary | ICD-10-CM | POA: Diagnosis not present

## 2020-12-11 DIAGNOSIS — J3089 Other allergic rhinitis: Secondary | ICD-10-CM | POA: Diagnosis not present

## 2020-12-11 DIAGNOSIS — J301 Allergic rhinitis due to pollen: Secondary | ICD-10-CM | POA: Diagnosis not present

## 2020-12-21 DIAGNOSIS — J301 Allergic rhinitis due to pollen: Secondary | ICD-10-CM | POA: Diagnosis not present

## 2020-12-21 DIAGNOSIS — J3089 Other allergic rhinitis: Secondary | ICD-10-CM | POA: Diagnosis not present

## 2020-12-21 DIAGNOSIS — J3081 Allergic rhinitis due to animal (cat) (dog) hair and dander: Secondary | ICD-10-CM | POA: Diagnosis not present

## 2020-12-23 ENCOUNTER — Other Ambulatory Visit: Payer: Self-pay | Admitting: Family Medicine

## 2021-01-08 DIAGNOSIS — J3089 Other allergic rhinitis: Secondary | ICD-10-CM | POA: Diagnosis not present

## 2021-01-08 DIAGNOSIS — J301 Allergic rhinitis due to pollen: Secondary | ICD-10-CM | POA: Diagnosis not present

## 2021-01-08 DIAGNOSIS — J3081 Allergic rhinitis due to animal (cat) (dog) hair and dander: Secondary | ICD-10-CM | POA: Diagnosis not present

## 2021-01-11 ENCOUNTER — Other Ambulatory Visit: Payer: Self-pay | Admitting: Family Medicine

## 2021-01-11 DIAGNOSIS — F419 Anxiety disorder, unspecified: Secondary | ICD-10-CM

## 2021-01-14 ENCOUNTER — Other Ambulatory Visit: Payer: Self-pay | Admitting: Family Medicine

## 2021-01-14 DIAGNOSIS — J3081 Allergic rhinitis due to animal (cat) (dog) hair and dander: Secondary | ICD-10-CM | POA: Diagnosis not present

## 2021-01-14 DIAGNOSIS — J301 Allergic rhinitis due to pollen: Secondary | ICD-10-CM | POA: Diagnosis not present

## 2021-01-14 DIAGNOSIS — J3089 Other allergic rhinitis: Secondary | ICD-10-CM | POA: Diagnosis not present

## 2021-01-14 MED ORDER — ESCITALOPRAM OXALATE 10 MG PO TABS: 10.0000 mg | ORAL_TABLET | Freq: Two times a day (BID) | ORAL | 0 refills | Status: DC

## 2021-01-14 MED ORDER — BUPROPION HCL ER (SR) 150 MG PO TB12: 150.0000 mg | ORAL_TABLET | Freq: Two times a day (BID) | ORAL | 0 refills | Status: DC

## 2021-01-22 DIAGNOSIS — J3081 Allergic rhinitis due to animal (cat) (dog) hair and dander: Secondary | ICD-10-CM | POA: Diagnosis not present

## 2021-01-22 DIAGNOSIS — J3089 Other allergic rhinitis: Secondary | ICD-10-CM | POA: Diagnosis not present

## 2021-01-22 DIAGNOSIS — J301 Allergic rhinitis due to pollen: Secondary | ICD-10-CM | POA: Diagnosis not present

## 2021-01-31 DIAGNOSIS — J301 Allergic rhinitis due to pollen: Secondary | ICD-10-CM | POA: Diagnosis not present

## 2021-01-31 DIAGNOSIS — J3081 Allergic rhinitis due to animal (cat) (dog) hair and dander: Secondary | ICD-10-CM | POA: Diagnosis not present

## 2021-01-31 DIAGNOSIS — J3089 Other allergic rhinitis: Secondary | ICD-10-CM | POA: Diagnosis not present

## 2021-02-18 DIAGNOSIS — J3081 Allergic rhinitis due to animal (cat) (dog) hair and dander: Secondary | ICD-10-CM | POA: Diagnosis not present

## 2021-02-18 DIAGNOSIS — J301 Allergic rhinitis due to pollen: Secondary | ICD-10-CM | POA: Diagnosis not present

## 2021-02-18 DIAGNOSIS — J3089 Other allergic rhinitis: Secondary | ICD-10-CM | POA: Diagnosis not present

## 2021-02-20 ENCOUNTER — Other Ambulatory Visit: Payer: Self-pay

## 2021-02-20 ENCOUNTER — Inpatient Hospital Stay (HOSPITAL_COMMUNITY): Payer: BC Managed Care – PPO | Attending: Hematology

## 2021-02-20 DIAGNOSIS — D509 Iron deficiency anemia, unspecified: Secondary | ICD-10-CM | POA: Diagnosis not present

## 2021-02-20 DIAGNOSIS — D500 Iron deficiency anemia secondary to blood loss (chronic): Secondary | ICD-10-CM

## 2021-02-20 LAB — CBC WITH DIFFERENTIAL/PLATELET
Abs Immature Granulocytes: 0.01 10*3/uL (ref 0.00–0.07)
Basophils Absolute: 0.1 10*3/uL (ref 0.0–0.1)
Basophils Relative: 1 %
Eosinophils Absolute: 0.1 10*3/uL (ref 0.0–0.5)
Eosinophils Relative: 3 %
HCT: 41.3 % (ref 36.0–46.0)
Hemoglobin: 13.4 g/dL (ref 12.0–15.0)
Immature Granulocytes: 0 %
Lymphocytes Relative: 23 %
Lymphs Abs: 1.1 10*3/uL (ref 0.7–4.0)
MCH: 29.6 pg (ref 26.0–34.0)
MCHC: 32.4 g/dL (ref 30.0–36.0)
MCV: 91.4 fL (ref 80.0–100.0)
Monocytes Absolute: 0.5 10*3/uL (ref 0.1–1.0)
Monocytes Relative: 10 %
Neutro Abs: 3.2 10*3/uL (ref 1.7–7.7)
Neutrophils Relative %: 63 %
Platelets: 292 10*3/uL (ref 150–400)
RBC: 4.52 MIL/uL (ref 3.87–5.11)
RDW: 12.6 % (ref 11.5–15.5)
WBC: 5 10*3/uL (ref 4.0–10.5)
nRBC: 0 % (ref 0.0–0.2)

## 2021-02-20 LAB — IRON AND TIBC
Iron: 98 ug/dL (ref 28–170)
Saturation Ratios: 28 % (ref 10.4–31.8)
TIBC: 347 ug/dL (ref 250–450)
UIBC: 249 ug/dL

## 2021-02-20 LAB — FERRITIN: Ferritin: 14 ng/mL (ref 11–307)

## 2021-02-26 ENCOUNTER — Ambulatory Visit (HOSPITAL_COMMUNITY): Payer: BC Managed Care – PPO | Admitting: Physician Assistant

## 2021-02-26 ENCOUNTER — Encounter (HOSPITAL_COMMUNITY): Payer: Self-pay

## 2021-02-26 NOTE — Progress Notes (Deleted)
NO SHOW

## 2021-03-01 DIAGNOSIS — J3081 Allergic rhinitis due to animal (cat) (dog) hair and dander: Secondary | ICD-10-CM | POA: Diagnosis not present

## 2021-03-01 DIAGNOSIS — J301 Allergic rhinitis due to pollen: Secondary | ICD-10-CM | POA: Diagnosis not present

## 2021-03-01 DIAGNOSIS — J3089 Other allergic rhinitis: Secondary | ICD-10-CM | POA: Diagnosis not present

## 2021-03-25 DIAGNOSIS — J301 Allergic rhinitis due to pollen: Secondary | ICD-10-CM | POA: Diagnosis not present

## 2021-03-25 DIAGNOSIS — J3081 Allergic rhinitis due to animal (cat) (dog) hair and dander: Secondary | ICD-10-CM | POA: Diagnosis not present

## 2021-03-25 DIAGNOSIS — J3089 Other allergic rhinitis: Secondary | ICD-10-CM | POA: Diagnosis not present

## 2021-04-07 ENCOUNTER — Other Ambulatory Visit: Payer: Self-pay

## 2021-04-07 ENCOUNTER — Encounter (HOSPITAL_BASED_OUTPATIENT_CLINIC_OR_DEPARTMENT_OTHER): Payer: Self-pay

## 2021-04-07 ENCOUNTER — Emergency Department (HOSPITAL_BASED_OUTPATIENT_CLINIC_OR_DEPARTMENT_OTHER): Payer: BC Managed Care – PPO | Admitting: Radiology

## 2021-04-07 ENCOUNTER — Emergency Department (HOSPITAL_BASED_OUTPATIENT_CLINIC_OR_DEPARTMENT_OTHER)
Admission: EM | Admit: 2021-04-07 | Discharge: 2021-04-07 | Disposition: A | Discharge status: Home / Self Care | Payer: BC Managed Care – PPO | Attending: Emergency Medicine | Admitting: Emergency Medicine

## 2021-04-07 DIAGNOSIS — W268XXA Contact with other sharp object(s), not elsewhere classified, initial encounter: Secondary | ICD-10-CM | POA: Diagnosis not present

## 2021-04-07 DIAGNOSIS — S6992XA Unspecified injury of left wrist, hand and finger(s), initial encounter: Secondary | ICD-10-CM | POA: Diagnosis not present

## 2021-04-07 DIAGNOSIS — S60451A Superficial foreign body of left index finger, initial encounter: Secondary | ICD-10-CM | POA: Insufficient documentation

## 2021-04-07 DIAGNOSIS — Z7951 Long term (current) use of inhaled steroids: Secondary | ICD-10-CM | POA: Insufficient documentation

## 2021-04-07 DIAGNOSIS — S60459A Superficial foreign body of unspecified finger, initial encounter: Secondary | ICD-10-CM

## 2021-04-07 DIAGNOSIS — Z8616 Personal history of COVID-19: Secondary | ICD-10-CM | POA: Insufficient documentation

## 2021-04-07 DIAGNOSIS — Z23 Encounter for immunization: Secondary | ICD-10-CM | POA: Diagnosis not present

## 2021-04-07 DIAGNOSIS — J45909 Unspecified asthma, uncomplicated: Secondary | ICD-10-CM | POA: Diagnosis not present

## 2021-04-07 DIAGNOSIS — S60453A Superficial foreign body of left middle finger, initial encounter: Secondary | ICD-10-CM | POA: Diagnosis not present

## 2021-04-07 MED ORDER — DOXYCYCLINE HYCLATE 100 MG PO CAPS: 100.0000 mg | ORAL_CAPSULE | Freq: Two times a day (BID) | ORAL | 0 refills | Status: AC

## 2021-04-07 MED ORDER — TETANUS-DIPHTH-ACELL PERTUSSIS 5-2.5-18.5 LF-MCG/0.5 IM SUSY
0.5000 mL | PREFILLED_SYRINGE | Freq: Once | INTRAMUSCULAR | Status: AC
  Administered 2021-04-07: 0.5 mL via INTRAMUSCULAR
  Filled 2021-04-07: qty 0.5

## 2021-04-07 MED ORDER — LIDOCAINE HCL 2 % IJ SOLN
20.0000 mL | Freq: Once | INTRAMUSCULAR | Status: AC
  Administered 2021-04-07: 400 mg via INTRADERMAL
  Filled 2021-04-07: qty 20

## 2021-04-07 NOTE — ED Triage Notes (Signed)
Pt arrives ambulatory to ED with extraction tool stuck in left middle finger pt reports it has a c shaped hook to it and she is unable to get it out. States she was using it to open something when she got it stuck in her finger. Last tetanus unknown.

## 2021-04-07 NOTE — Discharge Instructions (Addendum)
You were given a prescription for antibiotics. Please take the antibiotic prescription fully.   Follow up with the hand surgeon next week  Please return to the emergency room immediately if you experience any new or worsening symptoms or any symptoms that indicate worsening infection such as fevers, increased redness/swelling/pain, warmth, or drainage from the affected area.

## 2021-04-07 NOTE — ED Provider Notes (Addendum)
[facility] EMERGENCY DEPT Provider Note   CSN: [id number] Arrival date & time: 04/07/21  1437     History Chief Complaint  Patient presents with   Finger Injury    Sonia [name] is a 35 y.o. female.  HPI   35 y/o female with a h/o allergy, anemia, anxiety, asthma, blood transfusion, covid 19, crohn's colitis, depression, who presents to the ED today for eval of finger pain. She was trying to cut some plastic pta when she stabbed herself in the finger with a metal object. She has been unable to remove it. 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   Acute pancreatitis 05/19/2020   Iron deficiency anemia due to chronic blood loss 02/14/2020   Moderate persistent asthma 09/06/2019   Insomnia 09/29/2018   Bulimia nervosa, purging type 09/15/2018   Moderate major depression ([place]) 09/15/2018   Crohn's disease of colon without complication ([place]) 51/70/0174   LUQ abdominal pain 03/01/2018   Crohn's disease ([place]) 08/30/2014   Headache disorder 08/30/2014   Anxiety 06/18/2009   GERD 05/23/2009    Past Surgical History:  Procedure Laterality Date   BIOPSY  05/24/2020   Procedure: BIOPSY;  Surgeon: Jerene Bears, MD;  Location: [facility];  Service: Gastroenterology;;   CESAREAN SECTION N/A 03/27/2013   Procedure: CESAREAN SECTION;  Surgeon: Lovenia Kim, MD;  Location: [facility];  Service: Obstetrics;  Laterality: N/A;   CESAREAN SECTION N/A 09/04/2014    Procedure: REPEAT CESAREAN SECTION;  Surgeon: Crawford Givens, MD;  Location: [facility];  Service: Obstetrics;  Laterality: N/A;   ESOPHAGOGASTRODUODENOSCOPY (EGD) WITH PROPOFOL N/A 05/24/2020   Procedure: ESOPHAGOGASTRODUODENOSCOPY (EGD) WITH PROPOFOL;  Surgeon: Jerene Bears, MD;  Location: [facility] ENDOSCOPY;  Service: Gastroenterology;  Laterality: N/A;   HAND SURGERY  2012   with nerve block   LAPAROSCOPIC ENDOMETRIOSIS FULGURATION     WISDOM TOOTH EXTRACTION       OB History     Gravida  2   Para  2   Term  2   Preterm  0   AB  0   Living  2      SAB  0   IAB  0   Ectopic  0   Multiple  0   Live Births  2           Family History  Problem Relation Age of Onset   COPD Mother    Heart disease Mother        smoker   Hernia Mother    Diverticulitis Mother    Kidney disease Mother    Heart failure Mother    Stroke Mother 44   Osteoporosis Mother    Anxiety disorder Mother        ? on xanax   Rheum arthritis Mother    Heart disease Maternal Grandmother        smoker  Arthritis-Osteo Maternal Grandmother    Cancer Maternal Grandfather        throat   Alcohol abuse Maternal Grandfather    Other Father        pituitary gland tumor   Depression Sister    Anxiety disorder Sister    Crohn's disease Cousin    Colon cancer Neg Hx    Rectal cancer Neg Hx    Stomach cancer Neg Hx    Esophageal cancer Neg Hx     Social History   Tobacco Use   Smoking status: Never   Smokeless tobacco: Former    Types: Nurse, children's Use: Never used  Substance Use Topics   Alcohol use: Yes    Comment: occasionally   Drug use: No    Home Medications Prior to Admission medications   Medication Sig Start Date End Date Taking? Authorizing Provider  doxycycline (VIBRAMYCIN) 100 MG capsule Take 1 capsule (100 mg total) by mouth 2 (two) times daily for 7 days. 04/07/21 04/14/21 Yes Lavenia Stumpo S, PA-C  albuterol (VENTOLIN HFA) 108 (90 Base) MCG/ACT inhaler  Inhale 1-2 puffs into the lungs every 4 (four) hours as needed for wheezing or shortness of breath. 08/26/19   Jaynee Eagles, PA-C  budesonide-formoterol (SYMBICORT) 80-4.5 MCG/ACT inhaler Inhale 2 puffs into the lungs 2 (two) times a day.    [provider]  buPROPion (WELLBUTRIN SR) 150 MG 12 hr tablet Take 1 tablet (150 mg total) by mouth 2 (two) times daily. 01/14/21   Caren Macadam, MD  cetirizine (ZYRTEC) 10 MG tablet Take 10 mg by mouth daily.    [provider]  dicyclomine (BENTYL) 20 MG tablet Take 1 tablet (20 mg total) by mouth 3 (three) times daily. 05/24/20   Swayze, Ava, DO  diphenhydrAMINE (BENADRYL) 25 mg capsule Take 25 mg by mouth at bedtime as needed for allergies. For itching    [provider]  escitalopram (LEXAPRO) 10 MG tablet Take 1 tablet (10 mg total) by mouth in the morning and at bedtime. 01/14/21   Koberlein, Steele Berg, MD  montelukast (SINGULAIR) 10 MG tablet Take 10 mg by mouth at bedtime.    [provider]  pantoprazole (PROTONIX) 40 MG tablet Take 1 tablet (40 mg total) by mouth daily. 09/12/20   Ladene Artist, MD  traZODone (DESYREL) 50 MG tablet TAKE 1 TABLET BY MOUTH EVERYDAY AT BEDTIME 12/24/20   Caren Macadam, MD  Norgestimate-Eth Estradiol (SPRINTEC 28 PO) Take by mouth.  09/14/19  [provider]    Allergies    Other  Review of Systems   Review of Systems  Constitutional:  Negative for fever.  Musculoskeletal:        Finger pain  Skin:  Positive for wound.  Neurological:  Positive for numbness. Negative for weakness.   Physical Exam Updated Vital Signs BP (!) 125/96 (BP Location: Right Arm)   Pulse 70   Temp 98.2 F (36.8 C)   Resp 14   Ht 5' 2"  (1.575 m)   Wt 72.6 kg   LMP 03/31/2021   SpO2 100%   BMI 29.26 kg/m   Physical Exam Vitals and nursing note reviewed.  Constitutional:      General: She is not in acute distress.    Appearance: She is well-developed.  HENT:     Head:  Normocephalic and atraumatic.  Eyes:     Conjunctiva/sclera: Conjunctivae normal.  Cardiovascular:     Rate and  Rhythm: Normal rate.  Pulmonary:     Effort: Pulmonary effort is normal.  Musculoskeletal:        General: Normal range of motion.     Cervical back: Neck supple.     Comments: Extraction tool noted to be lodged in the left index finger. Brisk cap refill distally. Reports slightly decreased sensation to finger.   Skin:    General: Skin is warm and dry.  Neurological:     Mental Status: She is alert.    ED Results / Procedures / Treatments   Labs (all labs ordered are listed, but only abnormal results are displayed) Labs Reviewed - No data to display  EKG None  Radiology DG Finger Middle Left  Result Date: 04/07/2021 CLINICAL DATA:  Foreign body stuck in the left third digit. EXAM: LEFT MIDDLE FINGER 2+V COMPARISON:  None. FINDINGS: There is no evidence of fracture or dislocation. There is a foreign body extending into the soft tissues of the distal third digit, palmar side, best seen on lateral view. The foreign body is located approximately 2 mm away from the adjacent distal phalanx. Several fixation screws are noted in the fourth metacarpal. IMPRESSION: Radiopaque foreign body extending into the soft tissues of the distal third digit. No osseous abnormality. Electronically Signed   By: Audie Pinto M.D.   On: 04/07/2021 15:45    Procedures .Foreign Body Removal  Date/Time: 04/07/2021 8:48 PM Performed by: Rodney Booze, PA-C Authorized by: Rodney Booze, PA-C  Consent: Verbal consent obtained. Risks and benefits: risks, benefits and alternatives were discussed Consent given by: patient Patient understanding: patient states understanding of the procedure being performed Imaging studies: imaging studies available Required items: required blood products, implants, devices, and special equipment available Patient identity confirmed: verbally with  patient Time out: Immediately prior to procedure a "time out" was called to verify the correct patient, procedure, equipment, support staff and site/side marked as required. Body area: skin General location: upper extremity Location details: left long finger Anesthesia: digital block and local infiltration  Anesthesia: Local Anesthetic: lidocaine 2% without epinephrine Anesthetic total: 10 mL  Sedation: Patient sedated: no  Tendon involvement: none Complexity: simple 1 objects recovered. Objects recovered: extraction tool Post-procedure assessment: foreign body removed Patient tolerance: patient tolerated the procedure well with no immediate complications    Medications Ordered in ED Medications  Tdap (BOOSTRIX) injection 0.5 mL (0.5 mLs Intramuscular Given 04/07/21 1639)  lidocaine (XYLOCAINE) 2 % (with pres) injection 400 mg (400 mg Intradermal Given by Other 04/07/21 1641)    ED Course  I have reviewed the triage vital signs and the nursing notes.  Pertinent labs & imaging results that were available during my care of the patient were reviewed by me and considered in my medical decision making (see chart for details).    MDM Rules/Calculators/A&P                          35 y/o f with fb in the left index finger. Nvi distally. Reviewed/interpreted xray - Radiopaque foreign body extending into the soft tissues of the distal third digit. No osseous abnormality. Fb does not communicate with the bone.   Digital block was performed and fb was removed successfully. Pt tolerated well.   Will place on abx and have her f/u with hand. Advised on return precautions. She voices understanding and is in agreement with plan. All questions answered, pt stable for discharge   Final Clinical Impression(s) /  ED Diagnoses Final diagnoses:  Foreign body in skin of finger, initial encounter    Rx / DC Orders ED Discharge Orders          Ordered    doxycycline (VIBRAMYCIN) 100 MG  capsule  2 times daily        04/07/21 [address], Kyla Duffy S, PA-C 04/07/21 1724    Lennice Sites, DO 04/07/21 1734    Anyae Griffith, [place], PA-C 04/07/21 2049    Lennice Sites, DO 04/07/21 2055

## 2021-04-10 DIAGNOSIS — J3089 Other allergic rhinitis: Secondary | ICD-10-CM | POA: Diagnosis not present

## 2021-04-10 DIAGNOSIS — J301 Allergic rhinitis due to pollen: Secondary | ICD-10-CM | POA: Diagnosis not present

## 2021-04-10 DIAGNOSIS — J3081 Allergic rhinitis due to animal (cat) (dog) hair and dander: Secondary | ICD-10-CM | POA: Diagnosis not present

## 2021-04-15 ENCOUNTER — Other Ambulatory Visit: Payer: Self-pay | Admitting: Family Medicine

## 2021-04-22 DIAGNOSIS — J3081 Allergic rhinitis due to animal (cat) (dog) hair and dander: Secondary | ICD-10-CM | POA: Diagnosis not present

## 2021-04-22 DIAGNOSIS — J3089 Other allergic rhinitis: Secondary | ICD-10-CM | POA: Diagnosis not present

## 2021-04-22 DIAGNOSIS — J301 Allergic rhinitis due to pollen: Secondary | ICD-10-CM | POA: Diagnosis not present

## 2021-04-29 ENCOUNTER — Encounter: Payer: Self-pay | Admitting: Family Medicine

## 2021-04-29 ENCOUNTER — Ambulatory Visit: Payer: BC Managed Care – PPO | Admitting: Family Medicine

## 2021-04-29 VITALS — BP 96/62 | HR 61 | Temp 97.7°F | Ht 62.0 in | Wt 165.3 lb

## 2021-04-29 DIAGNOSIS — J3081 Allergic rhinitis due to animal (cat) (dog) hair and dander: Secondary | ICD-10-CM | POA: Diagnosis not present

## 2021-04-29 DIAGNOSIS — J4540 Moderate persistent asthma, uncomplicated: Secondary | ICD-10-CM

## 2021-04-29 DIAGNOSIS — K5010 Crohn's disease of large intestine without complications: Secondary | ICD-10-CM | POA: Diagnosis not present

## 2021-04-29 DIAGNOSIS — F419 Anxiety disorder, unspecified: Secondary | ICD-10-CM | POA: Diagnosis not present

## 2021-04-29 DIAGNOSIS — R5383 Other fatigue: Secondary | ICD-10-CM

## 2021-04-29 DIAGNOSIS — Z13220 Encounter for screening for lipoid disorders: Secondary | ICD-10-CM | POA: Diagnosis not present

## 2021-04-29 DIAGNOSIS — J301 Allergic rhinitis due to pollen: Secondary | ICD-10-CM | POA: Diagnosis not present

## 2021-04-29 DIAGNOSIS — Z8349 Family history of other endocrine, nutritional and metabolic diseases: Secondary | ICD-10-CM | POA: Diagnosis not present

## 2021-04-29 DIAGNOSIS — F339 Major depressive disorder, recurrent, unspecified: Secondary | ICD-10-CM

## 2021-04-29 DIAGNOSIS — J3089 Other allergic rhinitis: Secondary | ICD-10-CM | POA: Diagnosis not present

## 2021-04-29 DIAGNOSIS — R109 Unspecified abdominal pain: Secondary | ICD-10-CM

## 2021-04-29 DIAGNOSIS — R748 Abnormal levels of other serum enzymes: Secondary | ICD-10-CM

## 2021-04-29 LAB — COMPREHENSIVE METABOLIC PANEL
ALT: 62 U/L — ABNORMAL HIGH (ref 0–35)
AST: 51 U/L — ABNORMAL HIGH (ref 0–37)
Albumin: 4.1 g/dL (ref 3.5–5.2)
Alkaline Phosphatase: 36 U/L — ABNORMAL LOW (ref 39–117)
BUN: 12 mg/dL (ref 6–23)
CO2: 30 mEq/L (ref 19–32)
Calcium: 9.5 mg/dL (ref 8.4–10.5)
Chloride: 101 mEq/L (ref 96–112)
Creatinine, Ser: 0.8 mg/dL (ref 0.40–1.20)
GFR: 95.55 mL/min (ref >60.00)
Glucose, Bld: 85 mg/dL (ref 70–99)
Potassium: 4.1 mEq/L (ref 3.5–5.1)
Sodium: 139 mEq/L (ref 135–145)
Total Bilirubin: 0.3 mg/dL (ref 0.2–1.2)
Total Protein: 6.6 g/dL (ref 6.0–8.3)

## 2021-04-29 LAB — VITAMIN B12: Vitamin B-12: 332 pg/mL (ref 211–911)

## 2021-04-29 LAB — CBC WITH DIFFERENTIAL/PLATELET
Basophils Absolute: 0.1 10*3/uL (ref 0.0–0.1)
Basophils Relative: 0.9 % (ref 0.0–3.0)
Eosinophils Absolute: 0.2 10*3/uL (ref 0.0–0.7)
Eosinophils Relative: 2.8 % (ref 0.0–5.0)
HCT: 38.1 % (ref 36.0–46.0)
Hemoglobin: 12.6 g/dL (ref 12.0–15.0)
Lymphocytes Relative: 24.8 % (ref 12.0–46.0)
Lymphs Abs: 1.5 10*3/uL (ref 0.7–4.0)
MCHC: 33.2 g/dL (ref 30.0–36.0)
MCV: 87 fl (ref 78.0–100.0)
Monocytes Absolute: 0.4 10*3/uL (ref 0.1–1.0)
Monocytes Relative: 6.7 % (ref 3.0–12.0)
Neutro Abs: 3.9 10*3/uL (ref 1.4–7.7)
Neutrophils Relative %: 64.8 % (ref 43.0–77.0)
Platelets: 320 10*3/uL (ref 150.0–400.0)
RBC: 4.38 Mil/uL (ref 3.87–5.11)
RDW: 13.2 % (ref 11.5–15.5)
WBC: 6 10*3/uL (ref 4.0–10.5)

## 2021-04-29 LAB — LIPID PANEL
Cholesterol: 136 mg/dL (ref 0–200)
HDL: 43.8 mg/dL (ref >39.00)
LDL Cholesterol: 70 mg/dL (ref 0–99)
NonHDL: 92.32
Total CHOL/HDL Ratio: 3
Triglycerides: 114 mg/dL (ref 0.0–149.0)
VLDL: 22.8 mg/dL (ref 0.0–40.0)

## 2021-04-29 LAB — FOLATE: Folate: 13.2 ng/mL (ref >5.9)

## 2021-04-29 LAB — LIPASE: Lipase: 18 U/L (ref 11.0–59.0)

## 2021-04-29 LAB — T4, FREE: Free T4: 0.71 ng/dL (ref 0.60–1.60)

## 2021-04-29 LAB — VITAMIN D 25 HYDROXY (VIT D DEFICIENCY, FRACTURES): VITD: 17.66 ng/mL — ABNORMAL LOW (ref 30.00–100.00)

## 2021-04-29 LAB — T3, FREE: T3, Free: 2.9 pg/mL (ref 2.3–4.2)

## 2021-04-29 LAB — TSH: TSH: 0.97 u[IU]/mL (ref 0.35–5.50)

## 2021-04-29 LAB — AMYLASE: Amylase: 40 U/L (ref 27–131)

## 2021-04-29 MED ORDER — ESCITALOPRAM OXALATE 20 MG PO TABS: 20.0000 mg | ORAL_TABLET | Freq: Every day | ORAL | 1 refills | Status: DC

## 2021-04-29 MED ORDER — BUPROPION HCL ER (XL) 300 MG PO TB24: 300.0000 mg | ORAL_TABLET | Freq: Every day | ORAL | 1 refills | Status: DC

## 2021-04-29 NOTE — Progress Notes (Signed)
Sonia [name] DOB: [birth year] Encounter date: 04/29/2021  This is a 35 y.o. female who presents with Chief Complaint  Patient presents with   Medication Refill    History of present illness: Last visit with me was 12/16/19.   1 year ago lost grandmother; then lost mom in August. 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 Still having hard time with death of mom, grandmother. Doesn't feel she is coping well. Hard to get up and get going. Feels attached to kids.   Feels like she can't breathe - anxiety just feels bad. Not drinking alcohol.   Motivation is an issue.   Getting allergy shots regularly. Breathing has been doing a lot better. She did just get over flu; so still working through this.   Had some stomach issues; worried about pancreatitis again. Better through weekend and today. Not sure if just crohns.  Bowel movements and regular.  No blood in stools.  Periods have been irregular. Coming every 2 weeks.   Allergies  Allergen Reactions   Other Other (See Comments)    Mold, mildew, grass, fungus, trees cause swelling of eyes   Current Meds  Medication Sig   albuterol (VENTOLIN HFA) 108 (90 Base) MCG/ACT inhaler Inhale 1-2 puffs into the lungs every 4 (four) hours as needed for wheezing or shortness of breath.   budesonide-formoterol (SYMBICORT) 80-4.5 MCG/ACT inhaler Inhale 2 puffs into the lungs 2 (two) times a day.   buPROPion (WELLBUTRIN XL) 300 MG 24 hr tablet Take 1 tablet (300 mg total) by mouth daily.   cetirizine (ZYRTEC) 10 MG tablet Take 10 mg by mouth daily.   dicyclomine (BENTYL) 20 MG tablet Take 1 tablet (20 mg total) by mouth 3 (three) times daily.   diphenhydrAMINE (BENADRYL) 25 mg capsule Take 25 mg by mouth at bedtime as needed for allergies. For itching   escitalopram (LEXAPRO) 20 MG tablet Take 1 tablet (20 mg total) by mouth daily.   montelukast (SINGULAIR) 10 MG tablet Take 10  mg by mouth at bedtime.   pantoprazole (PROTONIX) 40 MG tablet Take 1 tablet (40 mg total) by mouth daily.   traZODone (DESYREL) 50 MG tablet TAKE 1 TABLET BY MOUTH EVERYDAY AT BEDTIME   [DISCONTINUED] buPROPion (WELLBUTRIN SR) 150 MG 12 hr tablet Take 1 tablet (150 mg total) by mouth 2 (two) times daily.   [DISCONTINUED] escitalopram (LEXAPRO) 10 MG tablet Take 1 tablet (10 mg total) by mouth in the morning and at bedtime.    Review of Systems  Constitutional:  Positive for fatigue. Negative for chills and fever.  Respiratory:  Positive for shortness of breath (with anxiety). Negative for cough, chest tightness and wheezing.   Cardiovascular:  Negative for chest pain, palpitations and leg swelling.  Genitourinary:  Positive for menstrual problem (irregular periods; she is due for gyn exam).   Objective:  BP 96/62 (BP Location: Left Arm, Patient Position: Sitting, Cuff Size: Normal)   Pulse 61   Temp 97.7 F (36.5 C) (Oral)   Ht 5' 2"  (1.575 m)   Wt 165 lb 4.8 oz (75 kg)   LMP 04/16/2021 (Exact Date)   SpO2 96%   BMI 30.23 kg/m   Weight: 165 lb 4.8 oz (75 kg)   BP Readings from Last 3 Encounters:  04/29/21 96/62  04/07/21 (!) 125/96  09/05/20 105/71   Wt Readings from Last 3 Encounters:  04/29/21 165 lb 4.8 oz (  75 kg)  04/07/21 160 lb (72.6 kg)  07/18/20 153 lb (69.4 kg)    Physical Exam Constitutional:      General: She is not in acute distress.    Appearance: She is well-developed.  Cardiovascular:     Rate and Rhythm: Normal rate and regular rhythm.     Heart sounds: Normal heart sounds. No murmur heard.   No friction rub.  Pulmonary:     Effort: Pulmonary effort is normal. No respiratory distress.     Breath sounds: Normal breath sounds. No wheezing or rales.  Abdominal:     General: Abdomen is flat. Bowel sounds are normal.     Palpations: Abdomen is soft.     Tenderness: There is abdominal tenderness (mild suprapubic). There is no guarding or rebound.   Musculoskeletal:     Right lower leg: No edema.     Left lower leg: No edema.  Neurological:     Mental Status: She is alert and oriented to person, place, and time.  Psychiatric:        Behavior: Behavior normal.    Assessment/Plan 1. Fatigue, unspecified type Start with blood work.  Uncertain if chest related to depression at this point.  Further evaluation pending results. - CBC with Differential/Platelet; Future - Comprehensive metabolic panel; Future - Vitamin B12; Future - VITAMIN D 25 Hydroxy (Vit-D Deficiency, Fractures); Future - Folate; Future - Folate - VITAMIN D 25 Hydroxy (Vit-D Deficiency, Fractures) - Vitamin B12 - Comprehensive metabolic panel - CBC with Differential/Platelet  2. Moderate persistent asthma without complication Breathing has been stable.  Continue with Symbicort, Singulair.  Continue seeing allergist.  3. Crohn's disease of colon without complication ([place]) Generally bowels have been well controlled.  She has Bentyl for symptom relief.  4. Anxiety She has not been completely compliant with taking Lexapro, which was previously written for 10 mg twice daily.  We are going to increase to 20 mg tablet just once a day.  Hoping that this will keep him out more steady in her system. - escitalopram (LEXAPRO) 20 MG tablet; Take 1 tablet (20 mg total) by mouth daily.  Dispense: 90 tablet; Refill: 1  6. Family history of thyroid disease - TSH; Future - T4, free; Future - T3, free; Future - T3, free - T4, free - TSH  7. Lipid screening - Lipid panel; Future - Lipid panel  8. Depression, recurrent ([place]) She has not been compliant with taking Wellbutrin twice daily 150 mg.  We are going to increase to XL 300 mg daily.  She was given number for lumbar behavioral health and name of Dennison Bulla to follow-up with for counseling.  We discussed importance of adding counseling to medication treatment to help with management of anxiety and depression. -  buPROPion (WELLBUTRIN XL) 300 MG 24 hr tablet; Take 1 tablet (300 mg total) by mouth daily.  Dispense: 90 tablet; Refill: 1  9. Abdominal pain, unspecified abdominal location Abdominal pain is improved from last week.  However, since doing the blood work we will recheck pancreatic enzymes due to history of pancreatitis. - Amylase - Lipase    Return in about 2 months (around 06/30/2021) for Chronic condition visit.     Micheline Rough, MD

## 2021-05-01 MED ORDER — VITAMIN D (ERGOCALCIFEROL) 1.25 MG (50000 UNIT) PO CAPS: 50000.0000 [IU] | ORAL_CAPSULE | ORAL | 0 refills | Status: DC

## 2021-05-01 NOTE — Addendum Note (Signed)
Addended by: Johnella Moloney on: 05/01/2021 10:40 AM   Modules accepted: Orders

## 2021-05-16 ENCOUNTER — Other Ambulatory Visit (INDEPENDENT_AMBULATORY_CARE_PROVIDER_SITE_OTHER): Payer: BC Managed Care – PPO

## 2021-05-16 DIAGNOSIS — J3089 Other allergic rhinitis: Secondary | ICD-10-CM | POA: Diagnosis not present

## 2021-05-16 DIAGNOSIS — R748 Abnormal levels of other serum enzymes: Secondary | ICD-10-CM

## 2021-05-16 DIAGNOSIS — J301 Allergic rhinitis due to pollen: Secondary | ICD-10-CM | POA: Diagnosis not present

## 2021-05-16 DIAGNOSIS — J3081 Allergic rhinitis due to animal (cat) (dog) hair and dander: Secondary | ICD-10-CM | POA: Diagnosis not present

## 2021-05-16 LAB — HEPATIC FUNCTION PANEL
ALT: 21 U/L (ref 0–35)
AST: 19 U/L (ref 0–37)
Albumin: 4 g/dL (ref 3.5–5.2)
Alkaline Phosphatase: 28 U/L — ABNORMAL LOW (ref 39–117)
Bilirubin, Direct: 0.1 mg/dL (ref 0.0–0.3)
Total Bilirubin: 0.3 mg/dL (ref 0.2–1.2)
Total Protein: 6.3 g/dL (ref 6.0–8.3)

## 2021-05-28 ENCOUNTER — Ambulatory Visit (INDEPENDENT_AMBULATORY_CARE_PROVIDER_SITE_OTHER): Payer: BC Managed Care – PPO | Admitting: Psychology

## 2021-05-28 DIAGNOSIS — J3089 Other allergic rhinitis: Secondary | ICD-10-CM | POA: Diagnosis not present

## 2021-05-28 DIAGNOSIS — F4323 Adjustment disorder with mixed anxiety and depressed mood: Secondary | ICD-10-CM | POA: Diagnosis not present

## 2021-05-28 DIAGNOSIS — H1045 Other chronic allergic conjunctivitis: Secondary | ICD-10-CM | POA: Diagnosis not present

## 2021-05-28 DIAGNOSIS — J3081 Allergic rhinitis due to animal (cat) (dog) hair and dander: Secondary | ICD-10-CM | POA: Diagnosis not present

## 2021-05-28 DIAGNOSIS — J301 Allergic rhinitis due to pollen: Secondary | ICD-10-CM | POA: Diagnosis not present
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
## 2021-06-03 ENCOUNTER — Encounter: Payer: Self-pay | Admitting: Family Medicine

## 2021-06-04 NOTE — Telephone Encounter (Signed)
Noted  

## 2021-06-05 DIAGNOSIS — J301 Allergic rhinitis due to pollen: Secondary | ICD-10-CM | POA: Diagnosis not present

## 2021-06-05 DIAGNOSIS — J3081 Allergic rhinitis due to animal (cat) (dog) hair and dander: Secondary | ICD-10-CM | POA: Diagnosis not present

## 2021-06-05 DIAGNOSIS — J3089 Other allergic rhinitis: Secondary | ICD-10-CM | POA: Diagnosis not present

## 2021-06-10 ENCOUNTER — Encounter: Payer: Self-pay | Admitting: Family Medicine

## 2021-06-11 ENCOUNTER — Ambulatory Visit: Payer: BC Managed Care – PPO | Admitting: Psychology

## 2021-06-11 DIAGNOSIS — F4323 Adjustment disorder with mixed anxiety and depressed mood: Secondary | ICD-10-CM | POA: Diagnosis not present
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
## 2021-06-21 DIAGNOSIS — J301 Allergic rhinitis due to pollen: Secondary | ICD-10-CM | POA: Diagnosis not present

## 2021-06-21 DIAGNOSIS — J3089 Other allergic rhinitis: Secondary | ICD-10-CM | POA: Diagnosis not present

## 2021-06-21 DIAGNOSIS — J3081 Allergic rhinitis due to animal (cat) (dog) hair and dander: Secondary | ICD-10-CM | POA: Diagnosis not present

## 2021-06-27 ENCOUNTER — Ambulatory Visit (INDEPENDENT_AMBULATORY_CARE_PROVIDER_SITE_OTHER): Payer: BC Managed Care – PPO | Admitting: Psychology

## 2021-06-27 DIAGNOSIS — F4323 Adjustment disorder with mixed anxiety and depressed mood: Secondary | ICD-10-CM

## 2021-06-27 NOTE — Progress Notes (Signed)
[facility] Counselor/Therapist Progress Note  Patient ID: Sonia [name], MRN: [id number],    Date: 06/27/2021  Time Spent: 45 mins  Treatment Type: Individual Therapy  Reported Symptoms: Pt presents in person for follow up session, granting consent for the session.  Pt shares she did practice deep breathing since our last session; pt shares that she practices every day and uses the technique daily.  Mental Status Exam: Appearance:  Casual     Behavior: Appropriate  Motor: Normal  Speech/Language:  Clear and Coherent  Affect: Appropriate  Mood: normal  Thought process: normal  Thought content:   WNL  Sensory/Perceptual disturbances:   WNL  Orientation: oriented to person, place, and time/date  Attention: Good  Concentration: Good  Memory: WNL  Fund of knowledge:  Good  Insight:   Good  Judgment:  Good  Impulse Control: Good   Risk Assessment: Danger to Self:  No Self-injurious Behavior: No Danger to Others: No Duty to Warn:no Physical Aggression / Violence:No  Access to Firearms a concern: No  Gang Involvement:No   Subjective: Pt shares that lots of things create anxiety for her (I.e., things with kids, death of her mom, things with marriage and in-laws, etc.).  Pt shares she has never been close with sister-in-law; was closer to other in-laws in the past.  Grayland Ormond planned to send the kids to his parents this weekend so they can be together for the weekend.  Pt also shares she has two job offers: 1) as an Engineering geologist and, 2) working for [employer] in the [employer].  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  After talking through the pros and cons of the jobs, pt shares she thinks that she wants to try the [place] job at this time.  Encouraged pt to give herself permission  to make thi choice and we will meet next week to see how her choice is unfolding for her.  Encouraged pt to continue with her self care activities as well.   Interventions: Cognitive Behavioral Therapy  Diagnosis:Adjustment disorder with mixed anxiety and depressed mood  Plan: Treatment Plan Strengths/Abilities:  Intelligent, Intuitive, Willing to participate in therapy Treatment Preferences:  Outpatient Individual Therapy Statement of Needs:  Patient is to use CBT, mindfulness and coping skills to help manage and/or decrease symptoms associated with their diagnosis. Symptoms:  Depressed/Irritable mood, worry, social withdrawal Problems Addressed:  Depressive thoughts, Sadness, Sleep issues, etc. Long Term Goals:  Pt to reduce overall level, frequency, and intensity of the feelings of depression/anxiety as evidenced by decreased irritability, negative self talk, and helpless feelings from 6 to 7 days/week to 0 to 1 days/week, per client report, for at least 3 consecutive months.  Progress: 0% Short Term Goals:  Pt to verbally express understanding of the relationship between feelings of depression/anxiety and their impact on thinking patterns and behaviors.  Pt to verbalize an understanding of the role that distorted thinking plays in creating fears, excessive worry, and ruminations.  Progress: 0% Target Date:  06/11/2022 Frequency:  Bi-weekly Modality:  Cognitive Behavioral Therapy Interventions by Therapist:  Therapist will use CBT, Mindfulness exercises, Coping skills and Referrals, as needed by client. Client has verbally approved this treatment plan.  Ivan Anchors, [facility]

## 2021-07-01 DIAGNOSIS — J3081 Allergic rhinitis due to animal (cat) (dog) hair and dander: Secondary | ICD-10-CM | POA: Diagnosis not present

## 2021-07-01 DIAGNOSIS — J3089 Other allergic rhinitis: Secondary | ICD-10-CM | POA: Diagnosis not present

## 2021-07-01 DIAGNOSIS — J301 Allergic rhinitis due to pollen: Secondary | ICD-10-CM | POA: Diagnosis not present

## 2021-07-04 ENCOUNTER — Ambulatory Visit: Payer: BC Managed Care – PPO | Admitting: Psychology

## 2021-07-04 DIAGNOSIS — R635 Abnormal weight gain: Secondary | ICD-10-CM | POA: Diagnosis not present

## 2021-07-04 DIAGNOSIS — R790 Abnormal level of blood mineral: Secondary | ICD-10-CM | POA: Diagnosis not present

## 2021-07-04 DIAGNOSIS — E7800 Pure hypercholesterolemia, unspecified: Secondary | ICD-10-CM | POA: Diagnosis not present

## 2021-07-04 DIAGNOSIS — R7989 Other specified abnormal findings of blood chemistry: Secondary | ICD-10-CM | POA: Diagnosis not present

## 2021-07-04 DIAGNOSIS — N951 Menopausal and female climacteric states: Secondary | ICD-10-CM | POA: Diagnosis not present

## 2021-07-04 DIAGNOSIS — E039 Hypothyroidism, unspecified: Secondary | ICD-10-CM | POA: Diagnosis not present

## 2021-07-05 ENCOUNTER — Other Ambulatory Visit: Payer: Self-pay | Admitting: Family Medicine

## 2021-07-10 DIAGNOSIS — Z1339 Encounter for screening examination for other mental health and behavioral disorders: Secondary | ICD-10-CM | POA: Diagnosis not present

## 2021-07-10 DIAGNOSIS — Z1331 Encounter for screening for depression: Secondary | ICD-10-CM | POA: Diagnosis not present

## 2021-07-10 DIAGNOSIS — N951 Menopausal and female climacteric states: Secondary | ICD-10-CM | POA: Diagnosis not present

## 2021-07-10 DIAGNOSIS — K5090 Crohn's disease, unspecified, without complications: Secondary | ICD-10-CM | POA: Diagnosis not present

## 2021-07-10 DIAGNOSIS — Z6831 Body mass index (BMI) 31.0-31.9, adult: Secondary | ICD-10-CM | POA: Diagnosis not present

## 2021-07-10 DIAGNOSIS — R635 Abnormal weight gain: Secondary | ICD-10-CM | POA: Diagnosis not present

## 2021-07-11 ENCOUNTER — Other Ambulatory Visit: Payer: Self-pay | Admitting: Family Medicine

## 2021-07-11 ENCOUNTER — Ambulatory Visit: Payer: BC Managed Care – PPO | Admitting: Psychology

## 2021-07-17 DIAGNOSIS — Z6831 Body mass index (BMI) 31.0-31.9, adult: Secondary | ICD-10-CM | POA: Diagnosis not present

## 2021-07-17 DIAGNOSIS — K219 Gastro-esophageal reflux disease without esophagitis: Secondary | ICD-10-CM | POA: Diagnosis not present

## 2021-07-18 ENCOUNTER — Ambulatory Visit: Payer: BC Managed Care – PPO | Admitting: Psychology

## 2021-07-19 ENCOUNTER — Other Ambulatory Visit: Payer: Self-pay | Admitting: Family Medicine

## 2021-07-19 ENCOUNTER — Encounter: Payer: Self-pay | Admitting: Family Medicine

## 2021-07-19 ENCOUNTER — Other Ambulatory Visit: Payer: Self-pay | Admitting: Gastroenterology

## 2021-07-19 DIAGNOSIS — F419 Anxiety disorder, unspecified: Secondary | ICD-10-CM

## 2021-07-19 DIAGNOSIS — F339 Major depressive disorder, recurrent, unspecified: Secondary | ICD-10-CM

## 2021-07-19 MED ORDER — ESCITALOPRAM OXALATE 20 MG PO TABS: 20.0000 mg | ORAL_TABLET | Freq: Every day | ORAL | 1 refills | Status: DC

## 2021-07-19 MED ORDER — BUPROPION HCL ER (XL) 300 MG PO TB24: 300.0000 mg | ORAL_TABLET | Freq: Every day | ORAL | 1 refills | Status: DC

## 2021-07-25 ENCOUNTER — Ambulatory Visit: Payer: BC Managed Care – PPO | Admitting: Psychology

## 2021-08-01 ENCOUNTER — Ambulatory Visit: Payer: BC Managed Care – PPO | Admitting: Psychology

## 2021-08-08 ENCOUNTER — Ambulatory Visit: Payer: BC Managed Care – PPO | Admitting: Psychology

## 2021-08-12 DIAGNOSIS — Z6830 Body mass index (BMI) 30.0-30.9, adult: Secondary | ICD-10-CM | POA: Diagnosis not present

## 2021-08-12 DIAGNOSIS — L700 Acne vulgaris: Secondary | ICD-10-CM | POA: Diagnosis not present

## 2021-08-12 DIAGNOSIS — R7989 Other specified abnormal findings of blood chemistry: Secondary | ICD-10-CM | POA: Diagnosis not present

## 2021-08-15 ENCOUNTER — Ambulatory Visit: Payer: BC Managed Care – PPO | Admitting: Psychology

## 2021-08-20 DIAGNOSIS — J301 Allergic rhinitis due to pollen: Secondary | ICD-10-CM | POA: Diagnosis not present

## 2021-08-20 DIAGNOSIS — J3081 Allergic rhinitis due to animal (cat) (dog) hair and dander: Secondary | ICD-10-CM | POA: Diagnosis not present

## 2021-08-20 DIAGNOSIS — J3089 Other allergic rhinitis: Secondary | ICD-10-CM | POA: Diagnosis not present

## 2021-08-22 ENCOUNTER — Ambulatory Visit: Payer: BC Managed Care – PPO | Admitting: Psychology

## 2021-08-22 DIAGNOSIS — J301 Allergic rhinitis due to pollen: Secondary | ICD-10-CM | POA: Diagnosis not present

## 2021-08-22 DIAGNOSIS — J3089 Other allergic rhinitis: Secondary | ICD-10-CM | POA: Diagnosis not present

## 2021-08-29 ENCOUNTER — Ambulatory Visit: Payer: BC Managed Care – PPO | Admitting: Psychology

## 2021-09-03 DIAGNOSIS — J301 Allergic rhinitis due to pollen: Secondary | ICD-10-CM | POA: Diagnosis not present

## 2021-09-03 DIAGNOSIS — J3089 Other allergic rhinitis: Secondary | ICD-10-CM | POA: Diagnosis not present

## 2021-09-03 DIAGNOSIS — J3081 Allergic rhinitis due to animal (cat) (dog) hair and dander: Secondary | ICD-10-CM | POA: Diagnosis not present

## 2021-09-05 ENCOUNTER — Ambulatory Visit: Payer: BC Managed Care – PPO | Admitting: Psychology

## 2021-09-12 ENCOUNTER — Ambulatory Visit: Payer: BC Managed Care – PPO | Admitting: Psychology

## 2021-09-17 DIAGNOSIS — J3081 Allergic rhinitis due to animal (cat) (dog) hair and dander: Secondary | ICD-10-CM | POA: Diagnosis not present

## 2021-09-17 DIAGNOSIS — J3089 Other allergic rhinitis: Secondary | ICD-10-CM | POA: Diagnosis not present

## 2021-09-17 DIAGNOSIS — J301 Allergic rhinitis due to pollen: Secondary | ICD-10-CM | POA: Diagnosis not present

## 2021-09-18 IMAGING — US US ABDOMEN LIMITED
1 series · 14 of 25 positions shown · non-contrast
Comparison: CT abdomen pelvis dated May 21, 2020.

CLINICAL DATA: Epigastric pain for the past 2 weeks.

EXAM:
ULTRASOUND ABDOMEN LIMITED RIGHT UPPER QUADRANT

[Series 1: us abdomen limited ruq (liver/gb) · 14 of 38 slices shown]
[im 1/38]
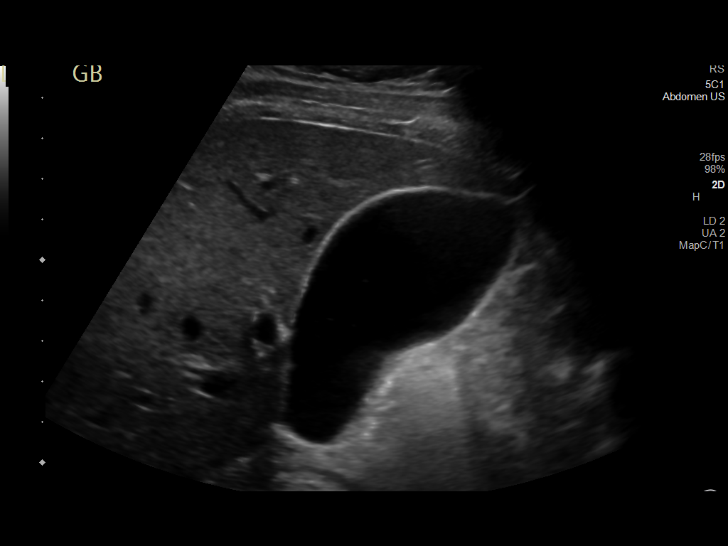
[im 4/38]
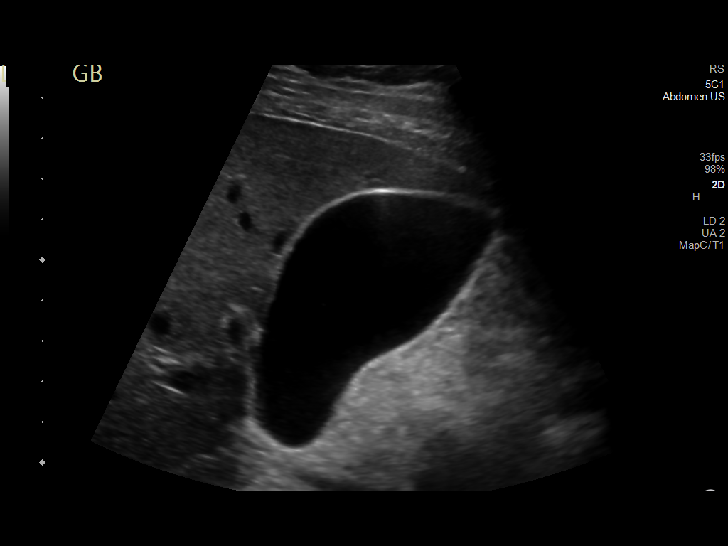
[im 7/38]
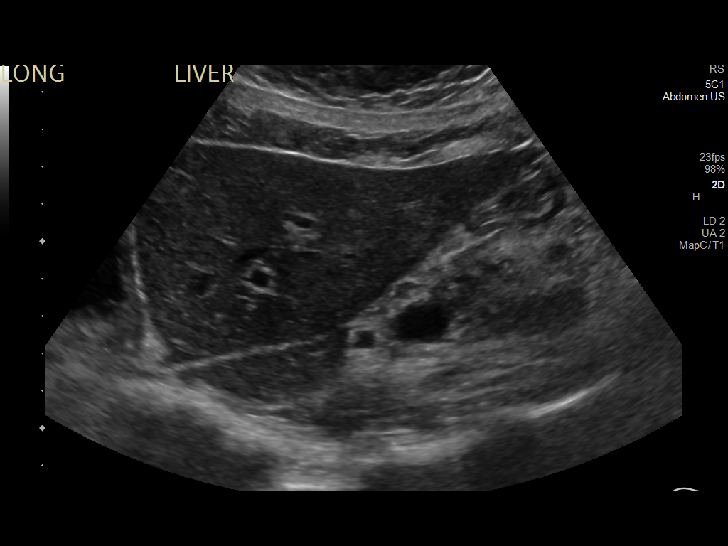
[im 10/38]
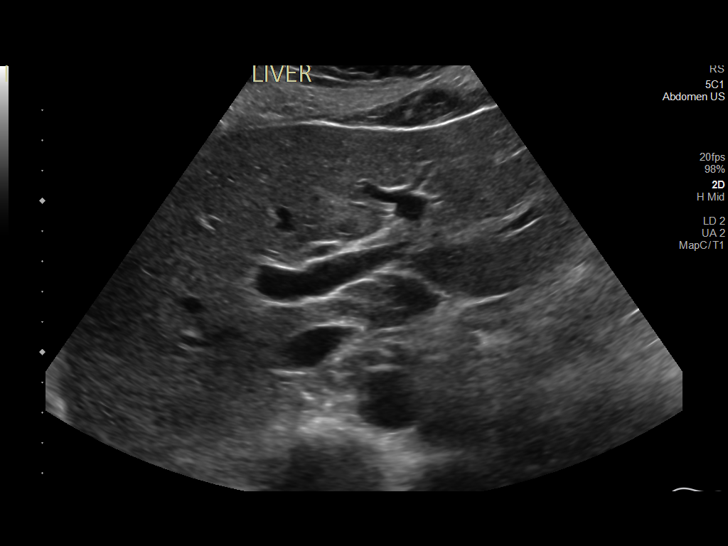
[im 13/38]
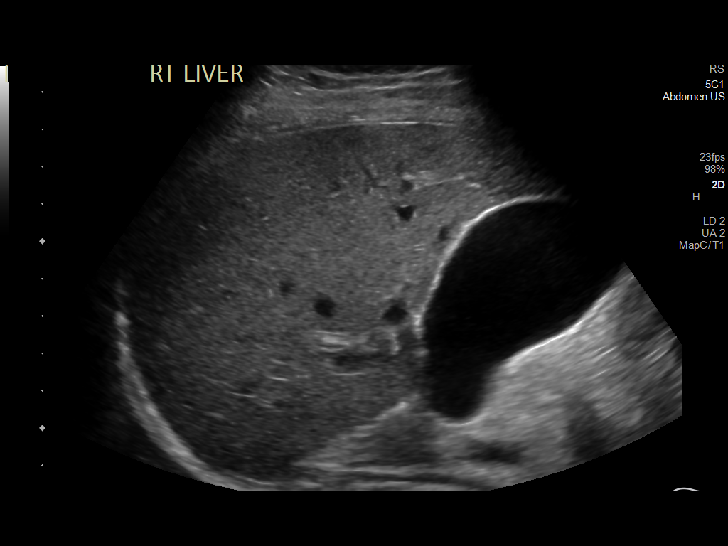
[im 14/38]
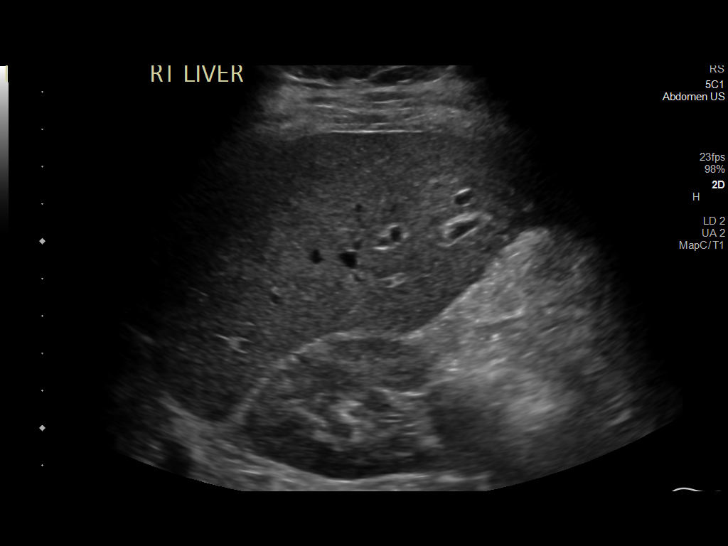
[im 17/38]
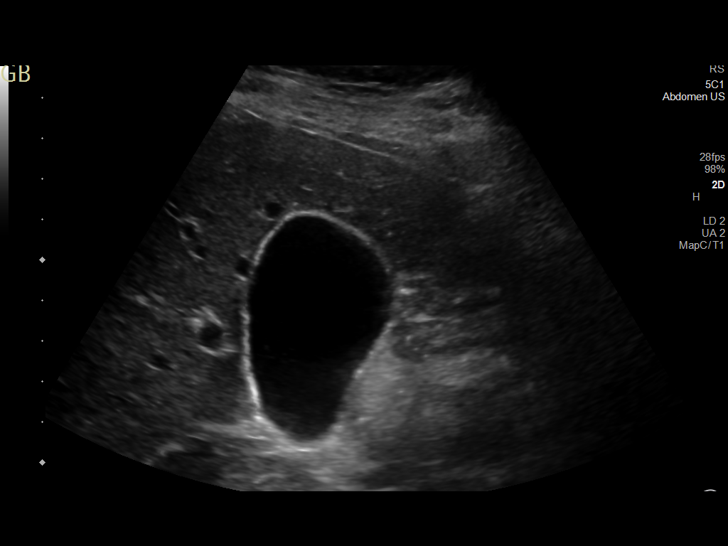
[im 21/38]
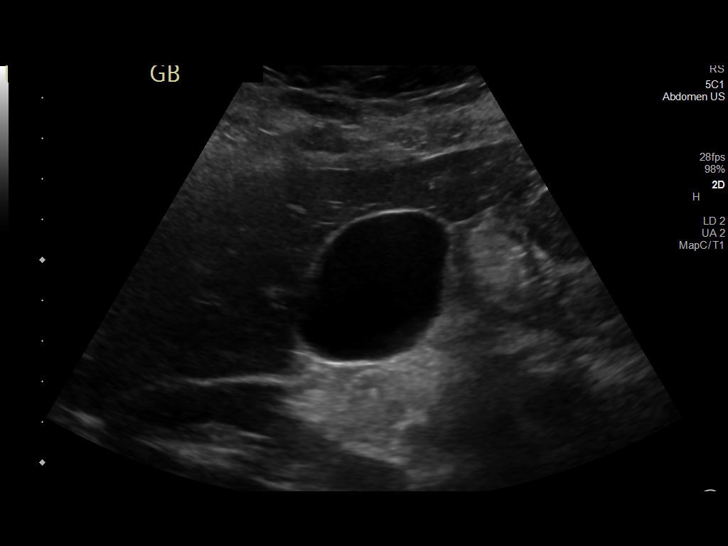
[im 24/38]
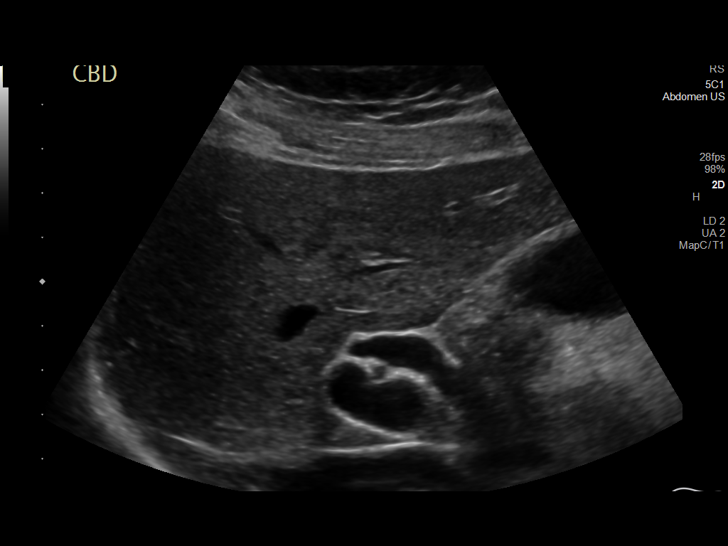
[im 25/38]
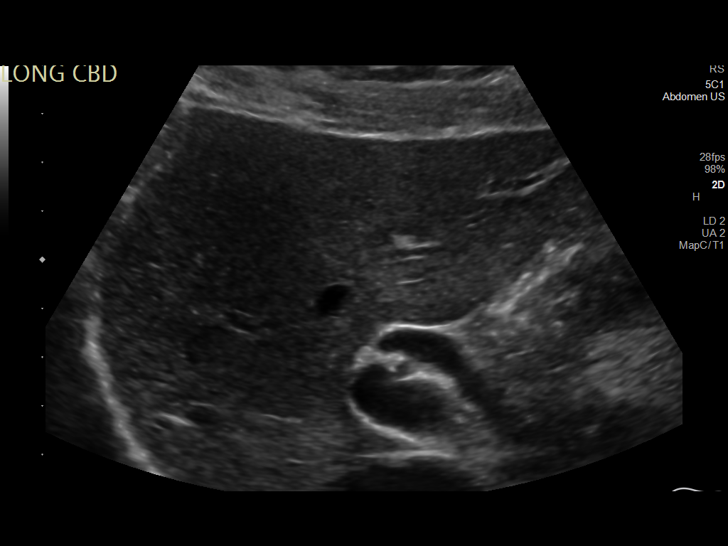
[im 28/38]
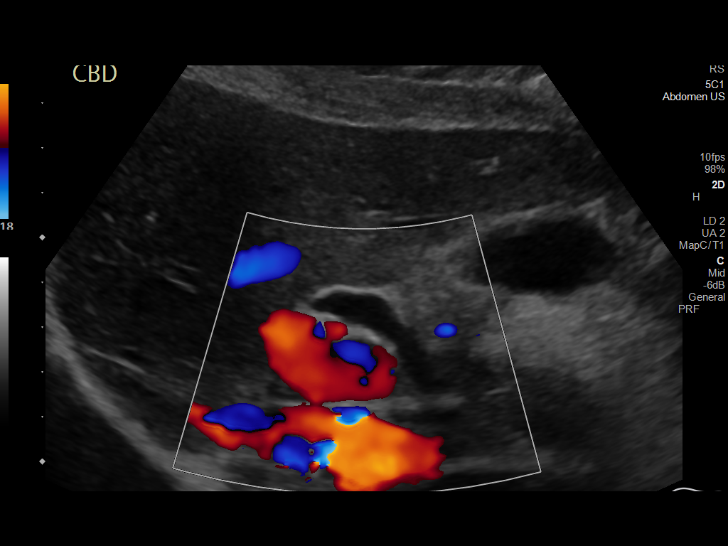
[im 31/38]
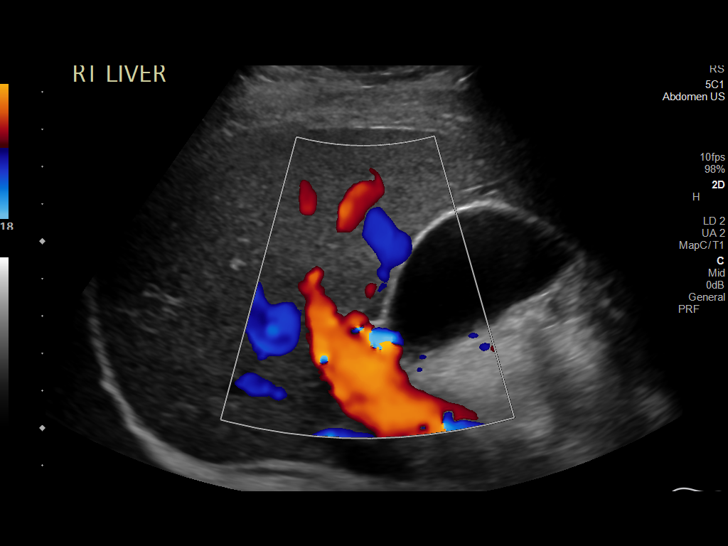
[im 34/38]
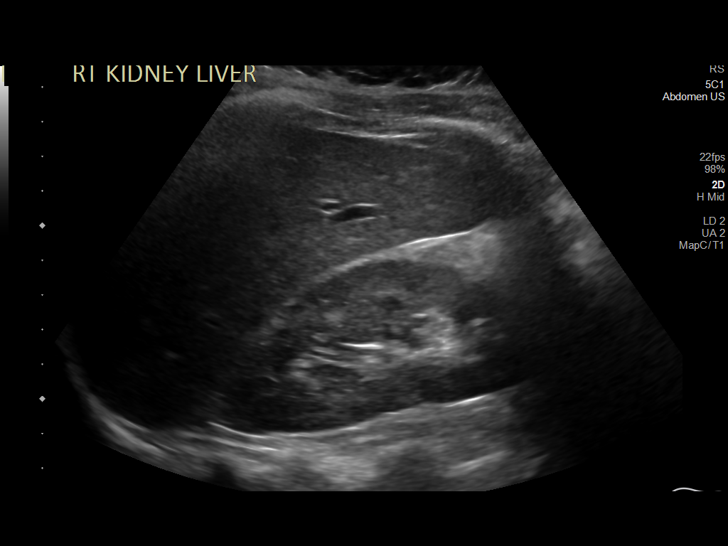
[im 38/38]
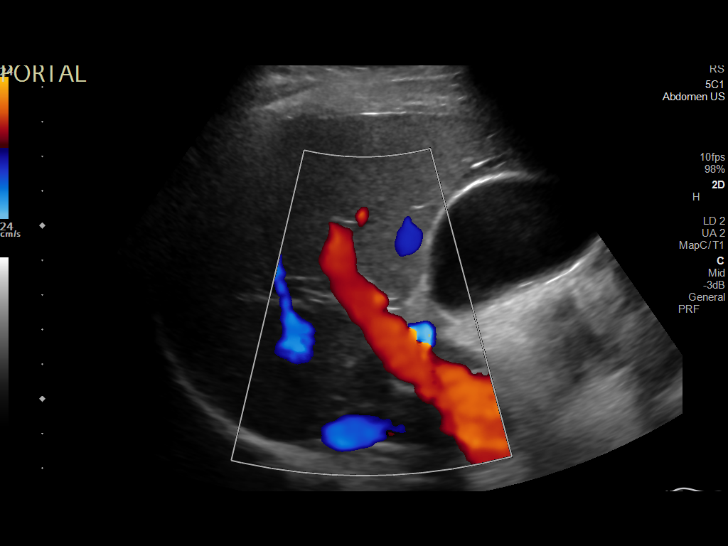

[14 of 25 positions shown; findings below may reference images not displayed]

FINDINGS: Gallbladder:

No gallstones or wall thickening visualized. No sonographic Murphy
sign noted by sonographer.

Common bile duct:

Diameter: 6 mm, normal.

Liver:

No focal lesion identified. Within normal limits in parenchymal
echogenicity. Portal vein is patent on color Doppler imaging with
normal direction of blood flow towards the liver.

Other: None.
IMPRESSION: 1. Normal right upper quadrant ultrasound.

## 2021-09-19 ENCOUNTER — Ambulatory Visit: Payer: BC Managed Care – PPO | Admitting: Psychology

## 2021-09-26 ENCOUNTER — Ambulatory Visit: Payer: BC Managed Care – PPO | Admitting: Psychology

## 2021-09-27 DIAGNOSIS — J301 Allergic rhinitis due to pollen: Secondary | ICD-10-CM | POA: Diagnosis not present

## 2021-09-27 DIAGNOSIS — J3089 Other allergic rhinitis: Secondary | ICD-10-CM | POA: Diagnosis not present

## 2021-10-03 ENCOUNTER — Ambulatory Visit: Payer: BC Managed Care – PPO | Admitting: Psychology

## 2021-10-03 DIAGNOSIS — J3081 Allergic rhinitis due to animal (cat) (dog) hair and dander: Secondary | ICD-10-CM | POA: Diagnosis not present

## 2021-10-03 DIAGNOSIS — J301 Allergic rhinitis due to pollen: Secondary | ICD-10-CM | POA: Diagnosis not present

## 2021-10-03 DIAGNOSIS — J3089 Other allergic rhinitis: Secondary | ICD-10-CM | POA: Diagnosis not present

## 2021-10-10 ENCOUNTER — Ambulatory Visit: Payer: BC Managed Care – PPO | Admitting: Psychology

## 2021-10-14 ENCOUNTER — Other Ambulatory Visit: Payer: Self-pay | Admitting: Gastroenterology

## 2021-10-17 ENCOUNTER — Ambulatory Visit: Payer: BC Managed Care – PPO | Admitting: Psychology

## 2021-10-24 ENCOUNTER — Ambulatory Visit: Payer: BC Managed Care – PPO | Admitting: Psychology

## 2021-10-31 ENCOUNTER — Ambulatory Visit: Payer: BC Managed Care – PPO | Admitting: Psychology

## 2021-11-07 ENCOUNTER — Ambulatory Visit: Payer: BC Managed Care – PPO | Admitting: Psychology

## 2021-11-14 ENCOUNTER — Ambulatory Visit: Payer: BC Managed Care – PPO | Admitting: Psychology

## 2021-11-21 ENCOUNTER — Ambulatory Visit: Payer: BC Managed Care – PPO | Admitting: Psychology

## 2021-11-22 ENCOUNTER — Encounter: Payer: Self-pay | Admitting: Family Medicine

## 2021-11-22 ENCOUNTER — Ambulatory Visit: Payer: BC Managed Care – PPO | Admitting: Family Medicine

## 2021-11-22 VITALS — BP 120/70 | HR 82 | Resp 12 | Ht 62.0 in | Wt 177.0 lb

## 2021-11-22 DIAGNOSIS — R519 Headache, unspecified: Secondary | ICD-10-CM

## 2021-11-22 DIAGNOSIS — R002 Palpitations: Secondary | ICD-10-CM | POA: Diagnosis not present

## 2021-11-22 DIAGNOSIS — F419 Anxiety disorder, unspecified: Secondary | ICD-10-CM

## 2021-11-22 DIAGNOSIS — D500 Iron deficiency anemia secondary to blood loss (chronic): Secondary | ICD-10-CM | POA: Diagnosis not present

## 2021-11-22 DIAGNOSIS — F321 Major depressive disorder, single episode, moderate: Secondary | ICD-10-CM

## 2021-11-22 DIAGNOSIS — R42 Dizziness and giddiness: Secondary | ICD-10-CM | POA: Diagnosis not present

## 2021-11-22 LAB — FERRITIN: Ferritin: 6.7 ng/mL — ABNORMAL LOW (ref 10.0–291.0)

## 2021-11-22 LAB — COMPREHENSIVE METABOLIC PANEL
ALT: 22 U/L (ref 0–35)
AST: 18 U/L (ref 0–37)
Albumin: 4.5 g/dL (ref 3.5–5.2)
Alkaline Phosphatase: 31 U/L — ABNORMAL LOW (ref 39–117)
BUN: 10 mg/dL (ref 6–23)
CO2: 31 mEq/L (ref 19–32)
Calcium: 9.7 mg/dL (ref 8.4–10.5)
Chloride: 100 mEq/L (ref 96–112)
Creatinine, Ser: 0.84 mg/dL (ref 0.40–1.20)
GFR: 89.75 mL/min (ref >60.00)
Glucose, Bld: 91 mg/dL (ref 70–99)
Potassium: 3.9 mEq/L (ref 3.5–5.1)
Sodium: 137 mEq/L (ref 135–145)
Total Bilirubin: 0.5 mg/dL (ref 0.2–1.2)
Total Protein: 7.1 g/dL (ref 6.0–8.3)

## 2021-11-22 LAB — CBC
HCT: 37.1 % (ref 36.0–46.0)
Hemoglobin: 12.1 g/dL (ref 12.0–15.0)
MCHC: 32.6 g/dL (ref 30.0–36.0)
MCV: 86.2 fl (ref 78.0–100.0)
Platelets: 331 10*3/uL (ref 150.0–400.0)
RBC: 4.31 Mil/uL (ref 3.87–5.11)
RDW: 14.4 % (ref 11.5–15.5)
WBC: 6.1 10*3/uL (ref 4.0–10.5)

## 2021-11-22 LAB — IRON: Iron: 72 ug/dL (ref 42–145)

## 2021-11-22 MED ORDER — ESCITALOPRAM OXALATE 20 MG PO TABS: 10.0000 mg | ORAL_TABLET | Freq: Every day | ORAL | 1 refills | Status: DC

## 2021-11-22 NOTE — Patient Instructions (Signed)
A few things to remember from today's visit:  No diagnosis found.  If you need refills please call your pharmacy. Do not use My Chart to request refills or for acute issues that need immediate attention.   Dizziness does not seem due to a neurologic problem, because having palpitations, I am recommending appt with cardiologist. Let's try decreasing lexapro dose from 20 mg to 10 mg, initially alternate between 20-10 mg every other day for 10 days then 10 mg daily. Please arrange follow up appt with Dr. Legrand Como, who will take care of Dr Berenice Bouton patients.   Please be sure medication list is accurate. If a new problem present, please set up appointment sooner than planned today.

## 2021-11-22 NOTE — Progress Notes (Unsigned)
ACUTE VISIT Chief Complaint  Patient presents with   Dizziness    X about a year, comes & goes, but once it happens its constant the entire time.    HPI: Sonia [name] is a 36 y.o. female with hx of depression,anxiety,allergies,asthma,crohn's disease, and headaches here today complaining of intermittent episodes of dizziness,she feels like stumbling. Problem has been going on for several years. Usually associated with throbbing like frontal headache. Negative for new associated symptoms. Dizziness is not like a spinning sensation, it is more like lightheadedness, exacerbated by prolonged standing, also when reaching above head level.  It usually lasts less than 2 minutes.  Dizziness This is a chronic problem. The current episode started more than 1 year ago. The problem occurs intermittently. The problem has been gradually worsening. Associated symptoms include fatigue, headaches and a visual change ("Black spots."). Pertinent negatives include no abdominal pain, anorexia, arthralgias, change in bowel habit, chest pain, chills, congestion, coughing, diaphoresis, fever, joint swelling, myalgias, nausea, neck pain, numbness, rash, sore throat, swollen glands, urinary symptoms, vertigo, vomiting or weakness. The symptoms are aggravated by standing. She has tried nothing for the symptoms.   Head CT done on 12/07/19 because dizziness was normal.  She has also noted intermittent episodes of "slurred" the speech, forgets where she is going, misplacing objects at work, difficulty concentrating,and occasionally she has missed exits while driving. A months ago she had sharp pain "in my brain." She feels like her depression and anxiety are well controlled, she wonders if symptoms are caused by medication she is taking. "Extreme fatigue." She is currently on Lexapro 20 mg daily, Wellbutrin XL 300 mg daily, and trazodone 50 mg at bedtime. She sleeps about 7 to 9 hours. Had CBT  before, for short period of time.     11/22/2021   12:06 PM 04/29/2021   12:07 PM 02/11/2019    8:06 AM 09/15/2018    2:56 PM  Depression screen PHQ 2/9  Decreased Interest 2 3  2   Down, Depressed, Hopeless 1 2 3 3   PHQ - 2 Score 3 5 3 5   Altered sleeping 1 3 3 3   Tired, decreased energy 3 3 3 3   Change in appetite 3 3 0 3  Feeling bad or failure about yourself  1 3 0 3  Trouble concentrating 3 1 2  0  Moving slowly or fidgety/restless 2 1 0 0  Suicidal thoughts 0 0 0 0  PHQ-9 Score 16 19 11 17   Difficult doing work/chores Very difficult   Somewhat difficult   Palpitations usually not associated with dizziness, "flutter" sensation in her chest, she has not identified exacerbating or alleviating factors. Sometimes she feels like her heart is skipping beats while exercising. No associated SOB,CP,or diaphoresis. Negative for syncope or MS changes. She states that her mother has a "deformed heart valve."  Allergic rhinitis, "always" congested.  She follows with immunologist.  Hx of iron deficiency anemia, she would like labs done today. She sometimes has heavy menses and for the past 6 months she has had irregular menses. She is not on iron supplementation. Component     Latest Ref Rng 04/29/2021  WBC     4.0 - 10.5 K/uL 6.0   RBC     3.87 - 5.11 Mil/uL 4.38   Hemoglobin     12.0 - 15.0 g/dL 12.6   HCT     36.0 - 46.0 % 38.1   MCV     78.0 - 100.0 fl  87.0   MCHC     30.0 - 36.0 g/dL 33.2   RDW     11.5 - 15.5 % 13.2   Platelets     150.0 - 400.0 K/uL 320.0   Neutrophils     43.0 - 77.0 % 64.8   Lymphocytes     12.0 - 46.0 % 24.8   Monocytes Relative     3.0 - 12.0 % 6.7   Eosinophil     0.0 - 5.0 % 2.8   Basophil     0.0 - 3.0 % 0.9   NEUT#     1.4 - 7.7 K/uL 3.9   Lymphocyte #     0.7 - 4.0 K/uL 1.5   Monocyte #     0.1 - 1.0 K/uL 0.4   Eosinophils Absolute     0.0 - 0.7 K/uL 0.2   Basophils Absolute     0.0 - 0.1 K/uL 0.1    Review of Systems   Constitutional:  Positive for fatigue. Negative for activity change, appetite change, chills, diaphoresis and fever.  HENT:  Negative for congestion, facial swelling, nosebleeds, sore throat and tinnitus.   Respiratory:  Negative for cough, shortness of breath and wheezing.   Cardiovascular:  Negative for chest pain and leg swelling.  Gastrointestinal:  Negative for abdominal pain, anorexia, change in bowel habit, nausea and vomiting.  Genitourinary:  Negative for decreased urine volume, dysuria and hematuria.  Musculoskeletal:  Negative for arthralgias, joint swelling, myalgias and neck pain.  Skin:  Negative for rash.  Neurological:  Positive for dizziness and headaches. Negative for vertigo, weakness and numbness.  Rest see pertinent positives and negatives per HPI.  Current Outpatient Medications on File Prior to Visit  Medication Sig Dispense Refill   albuterol (VENTOLIN HFA) 108 (90 Base) MCG/ACT inhaler Inhale 1-2 puffs into the lungs every 4 (four) hours as needed for wheezing or shortness of breath. 18 g 0   budesonide-formoterol (SYMBICORT) 80-4.5 MCG/ACT inhaler Inhale 2 puffs into the lungs 2 (two) times a day.     buPROPion (WELLBUTRIN XL) 300 MG 24 hr tablet Take 1 tablet (300 mg total) by mouth daily. 90 tablet 1   cetirizine (ZYRTEC) 10 MG tablet Take 10 mg by mouth daily.     dicyclomine (BENTYL) 20 MG tablet Take 1 tablet (20 mg total) by mouth 3 (three) times daily. 90 tablet 0   diphenhydrAMINE (BENADRYL) 25 mg capsule Take 25 mg by mouth at bedtime as needed for allergies. For itching     montelukast (SINGULAIR) 10 MG tablet Take 10 mg by mouth at bedtime.     pantoprazole (PROTONIX) 40 MG tablet TAKE 1 TABLET BY MOUTH EVERY DAY 30 tablet 0   traZODone (DESYREL) 50 MG tablet TAKE 1 TABLET BY MOUTH EVERYDAY AT BEDTIME 90 tablet 1   [DISCONTINUED] Norgestimate-Eth Estradiol (SPRINTEC 28 PO) Take by mouth.     No current facility-administered medications on file prior to  visit.   Past Medical History:  Diagnosis Date   Allergy    Anemia    Anxiety    Asthma    Blood transfusion without reported diagnosis    iron infusion   COVID-19    Crohn's colitis ([place])    Depression    GERD (gastroesophageal reflux disease)    no meds   Normal labor 03/27/2013   Postpartum care following cesarean delivery (11/9) 03/27/2013   Allergies  Allergen Reactions   Other Other (See Comments)  Mold, mildew, grass, fungus, trees cause swelling of eyes   Social History   Socioeconomic History   Marital status: Married    Spouse name: Not on file   Number of children: 2   Years of education: Not on file   Highest education level: Not on file  Occupational History   Occupation: stay at home mother  Tobacco Use   Smoking status: Never   Smokeless tobacco: Former    Types: Nurse, children's Use: Never used  Substance and Sexual Activity   Alcohol use: Yes    Comment: occasionally   Drug use: No   Sexual activity: Yes    Birth control/protection: None  Other Topics Concern   Not on file  Social History Narrative   Not on file   Social Determinants of Health   Financial Resource Strain: Not on file  Food Insecurity: Not on file  Transportation Needs: Not on file  Physical Activity: Not on file  Stress: Not on file  Social Connections: Not on file   Vitals:   11/22/21 1159  BP: 120/70  Pulse: 82  Resp: 12  SpO2: 97%   Body mass index is 32.37 kg/m.  Physical Exam Vitals and nursing note reviewed.  Constitutional:      General: She is not in acute distress.    Appearance: She is well-developed.  HENT:     Head: Normocephalic and atraumatic.     Right Ear: Tympanic membrane, ear canal and external ear normal.     Left Ear: Tympanic membrane, ear canal and external ear normal.     Nose:     Right Sinus: No maxillary sinus tenderness or frontal sinus tenderness.     Left Sinus: No maxillary sinus tenderness or frontal sinus  tenderness.     Mouth/Throat:     Mouth: Mucous membranes are moist.     Pharynx: Oropharynx is clear.  Eyes:     Conjunctiva/sclera: Conjunctivae normal.  Neck:     Vascular: No carotid bruit.  Cardiovascular:     Rate and Rhythm: Normal rate and regular rhythm.     Heart sounds: No murmur heard. Pulmonary:     Effort: Pulmonary effort is normal. No respiratory distress.     Breath sounds: Normal breath sounds.  Abdominal:     Palpations: Abdomen is soft. There is no hepatomegaly or mass.     Tenderness: There is no abdominal tenderness.  Lymphadenopathy:     Cervical: No cervical adenopathy.  Skin:    General: Skin is warm.     Findings: No erythema or rash.  Neurological:     General: No focal deficit present.     Mental Status: She is alert and oriented to person, place, and time.     Cranial Nerves: No cranial nerve deficit.     Motor: No tremor or pronator drift.     Gait: Gait normal.     Deep Tendon Reflexes:     Reflex Scores:      Bicep reflexes are 2+ on the right side and 2+ on the left side.      Patellar reflexes are 2+ on the right side and 2+ on the left side. Psychiatric:        Mood and Affect: Mood is anxious.     Comments: Well groomed, good eye contact.   ASSESSMENT AND PLAN:  Ms.Darnesha was seen today for dizziness.  Diagnoses and all orders for this visit: Orders Placed  This Encounter  Procedures   CBC   Iron   Ferritin   Comprehensive metabolic panel   Ambulatory referral to Cardiology   Lab Results  Component Value Date   WBC 6.1 11/22/2021   HGB 12.1 11/22/2021   HCT 37.1 11/22/2021   MCV 86.2 11/22/2021   PLT 331.0 11/22/2021   Lab Results  Component Value Date   CREATININE 0.84 11/22/2021   BUN 10 11/22/2021   NA 137 11/22/2021   K 3.9 11/22/2021   CL 100 11/22/2021   CO2 31 11/22/2021   Lab Results  Component Value Date   ALT 22 11/22/2021   AST 18 11/22/2021   ALKPHOS 31 (L) 11/22/2021   BILITOT 0.5 11/22/2021    Palpitations Chronic. Hx does not suggest a serious arrhythmia. No abnormalities appreciated on auscultation today and she has had normal EKG's in the past, so EKG was not done today. She is skipping beats when exercising, will benefit from heart monitor. so cardio evaluation will be arranged.  Dizziness We discussed possible etiologies. Some of her chronic medical problems and even medications can be contributing factors. Appt with cardiologist will be arranged. Fall precautions discussed. Instructed about warning signs.  Headache, unspecified headache type I do not think imaging is needed at this time. ? Tension headache. Allergies can be a contributing factor.  Iron deficiency anemia due to chronic blood loss Last CBC in 04/2021 normal. Further recommendations according to CBC and iron studies results.  Anxiety She would like to try to stop some of her medications, she agrees with decreasing dose of Lexapro. Recommend alternating Lexapro dose between 20 and 10 mg every other day for 10 days then continue 10 mg (1/2 tab) daily. No changes in Trazodone dose. A list of psychotherapist with contact information given, CBT recommended.  -     escitalopram (LEXAPRO) 20 MG tablet; Take 0.5 tablets (10 mg total) by mouth daily.  Moderate major depression ([place]) She is reporting it as well controlled but this problem could caused some of her symptoms. Continue Trazodone and Wellbutrin XL same dose. Lexapro dose decreased from 20 mg to 10 mg. CBT recommended. Instructed about warning signs. F/U in 2 months, before if needed.  I spent a total of 43 minutes in both face to face and non face to face activities for this visit on the date of this encounter. During this time history was obtained and documented, examination was performed, prior labs/imaging reviewed, and assessment/plan discussed.  Return in about 2 months (around 01/23/2022) for Dr Legrand Como.  Ismar Yabut G. Martinique, MD  [facility]. [place] office.

## 2021-11-23 ENCOUNTER — Encounter: Payer: Self-pay | Admitting: Family Medicine

## 2021-11-26 DIAGNOSIS — R002 Palpitations: Secondary | ICD-10-CM | POA: Insufficient documentation

## 2021-11-26 NOTE — Progress Notes (Unsigned)
Cardiology Office Note  Date:  11/27/2021   ID:  Sonia [name], DOB 01-Sep-1985, MRN [id number]  PCP:  Caren Macadam, MD (Inactive)   Chief Complaint  Patient presents with   New Patient (Initial Visit)    Referred by PCP for palpitations. Meds reviewed verbally with patient.     HPI:  [name] is a 36 year old woman with past medical history of Anemia Pancreatitis Crohn's disease Anxiety/depression/adjustment disorder Referred by Dr. Betty Martinique for palpitations, lightheadedness  On discussion today, she reports long history of lightheadedness, head pain and a feeling of head rush when she stands up Denies significant palpitations Often throughout the day and on many days of the week has symptoms when standing up Feels that she remains hydrated Symptoms date back several years Symptoms typically present after being seated for a period of time then standing such as getting out of a car then going shopping, she has to stand in place until symptoms eventually resolved  Takes wellbutrin in the Am Been weaning lexapro Feels anxiety well controlled  Mother with open heart surgery: valve replacement , CAD, CVA  Orthostatics performed in the office today were negative no significant change in heart rate which remained in the 60s, blood pressure which remained in the 120 range with standing   EKG personally reviewed by myself on todays visit Normal sinus rhythm rate 60 bpm no significant ST-T wave changes  PMH:   has a past medical history of Allergy, Anemia, Anxiety, Asthma, Blood transfusion without reported diagnosis, COVID-19, Crohn's colitis ([place]), Depression, GERD (gastroesophageal reflux disease), Normal labor (03/27/2013), and Postpartum care following cesarean delivery (11/9) (03/27/2013).  PSH:    Past Surgical History:  Procedure Laterality Date   BIOPSY  05/24/2020   Procedure: BIOPSY;  Surgeon: Jerene Bears, MD;  [facility];   Service: Gastroenterology;;   CESAREAN SECTION N/A 03/27/2013   Procedure: CESAREAN SECTION;  Surgeon: Lovenia Kim, MD;  Location: [facility];  Service: Obstetrics;  Laterality: N/A;   CESAREAN SECTION N/A 09/04/2014   Procedure: REPEAT CESAREAN SECTION;  Surgeon: Crawford Givens, MD;  Location: [facility];  Service: Obstetrics;  Laterality: N/A;   ESOPHAGOGASTRODUODENOSCOPY (EGD) WITH PROPOFOL N/A 05/24/2020   Procedure: ESOPHAGOGASTRODUODENOSCOPY (EGD) WITH PROPOFOL;  Surgeon: Jerene Bears, MD;  Location: [facility] ENDOSCOPY;  Service: Gastroenterology;  Laterality: N/A;   HAND SURGERY  2012   with nerve block   LAPAROSCOPIC ENDOMETRIOSIS FULGURATION     WISDOM TOOTH EXTRACTION      Current Outpatient Medications  Medication Sig Dispense Refill   albuterol (VENTOLIN HFA) 108 (90 Base) MCG/ACT inhaler Inhale 1-2 puffs into the lungs every 4 (four) hours as needed for wheezing or shortness of breath. 18 g 0   budesonide-formoterol (SYMBICORT) 80-4.5 MCG/ACT inhaler Inhale 2 puffs into the lungs 2 (two) times a day.     buPROPion (WELLBUTRIN XL) 300 MG 24 hr tablet Take 1 tablet (300 mg total) by mouth daily. 90 tablet 1   cetirizine (ZYRTEC) 10 MG tablet Take 10 mg by mouth daily.     dicyclomine (BENTYL) 20 MG tablet Take 1 tablet (20 mg total) by mouth 3 (three) times daily. 90 tablet 0   diphenhydrAMINE (BENADRYL) 25 mg capsule Take 25 mg by mouth at bedtime as needed for allergies. For itching     escitalopram (LEXAPRO) 20 MG tablet Take 0.5 tablets (10 mg total) by mouth daily. 90 tablet 1   montelukast (SINGULAIR) 10 MG tablet Take 10  mg by mouth at bedtime.     pantoprazole (PROTONIX) 40 MG tablet TAKE 1 TABLET BY MOUTH EVERY DAY 30 tablet 0   traZODone (DESYREL) 50 MG tablet TAKE 1 TABLET BY MOUTH EVERYDAY AT BEDTIME 90 tablet 1   No current facility-administered medications for this visit.    Allergies:   Other   Social History:  The patient  reports that she has never smoked. She has quit  using smokeless tobacco.  Her smokeless tobacco use included chew. She reports current alcohol use. She reports that she does not use drugs.   Family History:   family history includes Alcohol abuse in her maternal grandfather; Anxiety disorder in her mother and sister; Arthritis-Osteo in her maternal grandmother; COPD in her mother; Cancer in her maternal grandfather; Crohn's disease in her cousin; Depression in her sister; Diverticulitis in her mother; Heart disease in her maternal grandmother and mother; Heart failure in her mother; Hernia in her mother; Kidney disease in her mother; Osteoporosis in her mother; Other in her father; Rheum arthritis in her mother; Stroke (age of onset: 63) in her mother.    Review of Systems: Review of Systems  Constitutional: Negative.   HENT: Negative.    Respiratory: Negative.    Cardiovascular: Negative.   Gastrointestinal: Negative.   Musculoskeletal: Negative.   Neurological:  Positive for dizziness.  Psychiatric/Behavioral: Negative.    All other systems reviewed and are negative.   PHYSICAL EXAM: VS:  BP 120/68 (BP Location: Right Arm, Patient Position: Sitting, Cuff Size: Normal)   Pulse 60   Ht 5' 2"  (1.575 m)   Wt 175 lb (79.4 kg)   SpO2 99%   BMI 32.01 kg/m  , BMI Body mass index is 32.01 kg/m. Constitutional:  oriented to person, place, and time. No distress.  HENT:  Head: Grossly normal Eyes:  no discharge. No scleral icterus.  Neck: No JVD, no carotid bruits  Cardiovascular: Regular rate and rhythm, no murmurs appreciated Pulmonary/Chest: Clear to auscultation bilaterally, no wheezes or rails Abdominal: Soft.  no distension.  no tenderness.  Musculoskeletal: Normal range of motion Neurological:  normal muscle tone. Coordination normal. No atrophy Skin: Skin warm and dry Psychiatric: normal affect, pleasant  Recent Labs: 04/29/2021: TSH 0.97 11/22/2021: ALT 22; BUN 10; Creatinine, Ser 0.84; Hemoglobin 12.1; Platelets 331.0;  Potassium 3.9; Sodium 137    Lipid Panel Lab Results  Component Value Date   CHOL 136 04/29/2021   HDL 43.80 04/29/2021   LDLCALC 70 04/29/2021   TRIG 114.0 04/29/2021      Wt Readings from Last 3 Encounters:  11/27/21 175 lb (79.4 kg)  11/22/21 177 lb (80.3 kg)  04/29/21 165 lb 4.8 oz (75 kg)       ASSESSMENT AND PLAN:  Problem List Items Addressed This Visit     Palpitations   Relevant Orders   EKG 12-Lead   Other Visit Diagnoses     Flushing    -  Primary      Dizziness, head discomfort, flushing  Has symptoms when she stands up, seems to happen on a regular basis Classically this would be consistent with orthostasis given reproducible symptoms on standing Has not been able to figure out a solution for her symptom, reports staying hydrated Orthostatics negative on today's visit Recommend she closely monitor blood pressure at home when she has symptoms of dizziness, check pressures on standing and call our office if pressures run low -Strategies for drops in pressure include compressions/leggings, staying hydrated, not  avoiding salt, avoiding weight loss --Discussed her medications, unable to exclude side effect from Wellbutrin, as most likely culprit Given normal clinical exam and EKG, echocardiogram likely of little benefit She has no symptoms on exertion Denies palpitations concerning for cardiac arrhythmia Symptoms only seem to happen on standing from a supine or sitting position Precautions recommended to rise slowly, always stay hydrated, avoid excessive heat or standing for prolonged periods of time in 1 location    Total encounter time more than 60 minutes  Greater than 50% was spent in counseling and coordination of care with the patient  Patient seen in consultation for Dr. Betty Martinique and will be referred back to her office for ongoing care of the issues detailed above  Signed, Esmond Plants, M.D., Ph.D. [place],  [place]

## 2021-11-27 ENCOUNTER — Encounter: Payer: Self-pay | Admitting: Emergency Medicine

## 2021-11-27 ENCOUNTER — Ambulatory Visit: Payer: BC Managed Care – PPO | Admitting: Cardiovascular Disease

## 2021-11-27 ENCOUNTER — Encounter: Payer: Self-pay | Admitting: Cardiovascular Disease

## 2021-11-27 VITALS — BP 120/68 | HR 60 | Ht 62.0 in | Wt 175.0 lb

## 2021-11-27 DIAGNOSIS — R002 Palpitations: Secondary | ICD-10-CM | POA: Diagnosis not present

## 2021-11-27 DIAGNOSIS — R232 Flushing: Secondary | ICD-10-CM

## 2021-11-27 NOTE — Patient Instructions (Signed)
Medication Instructions:  No changes  If you need a refill on your cardiac medications before your next appointment, please call your pharmacy.   Lab work: No new labs needed  Testing/Procedures: No new testing needed  Follow-Up: At [facility], you and your health needs are our priority.  As part of our continuing mission to provide you with exceptional heart care, we have created designated Provider Care Teams.  These Care Teams include your primary Cardiologist (physician) and Advanced Practice Providers (APPs -  Physician Assistants and Nurse Practitioners) who all work together to provide you with the care you need, when you need it.  You will need a follow up appointment as needed  Providers on your designated Care Team:   Christopher Berge, NP Ryan Dunn, PA-C Cadence Furth, PA-C  COVID-19 Vaccine Information can be found at: [email or web address] For questions related to vaccine distribution or appointments, please email [email or web address] or call [phone]    

## 2021-11-28 ENCOUNTER — Ambulatory Visit: Payer: BC Managed Care – PPO | Admitting: Psychology

## 2021-12-05 ENCOUNTER — Ambulatory Visit: Payer: BC Managed Care – PPO | Admitting: Psychology

## 2021-12-06 ENCOUNTER — Telehealth: Payer: BC Managed Care – PPO | Admitting: Physician Assistant

## 2021-12-06 DIAGNOSIS — J019 Acute sinusitis, unspecified: Secondary | ICD-10-CM

## 2021-12-06 DIAGNOSIS — B9689 Other specified bacterial agents as the cause of diseases classified elsewhere: Secondary | ICD-10-CM | POA: Diagnosis not present

## 2021-12-06 MED ORDER — AMOXICILLIN-POT CLAVULANATE 875-125 MG PO TABS: 1.0000 | ORAL_TABLET | Freq: Two times a day (BID) | ORAL | 0 refills | Status: DC

## 2021-12-06 NOTE — Progress Notes (Signed)
Virtual Visit Consent   Sonia [name], you are scheduled for a virtual visit with a [facility] provider today. Just as with appointments in the office, your consent must be obtained to participate. Your consent will be active for this visit and any virtual visit you may have with one of our providers in the next 365 days. If you have a MyChart account, a copy of this consent can be sent to you electronically.  As this is a virtual visit, video technology does not allow for your provider to perform a traditional examination. This may limit your provider's ability to fully assess your condition. If your provider identifies any concerns that need to be evaluated in person or the need to arrange testing (such as labs, EKG, etc.), we will make arrangements to do so. Although advances in technology are sophisticated, we cannot ensure that it will always work on either your end or our end. If the connection with a video visit is poor, the visit may have to be switched to a telephone visit. With either a video or telephone visit, we are not always able to ensure that we have a secure connection.  By engaging in this virtual visit, you consent to the provision of healthcare and authorize for your insurance to be billed (if applicable) for the services provided during this visit. Depending on your insurance coverage, you may receive a charge related to this service.  I need to obtain your verbal consent now. Are you willing to proceed with your visit today? Vickie Wynetta Emery Depaola has provided verbal consent on 12/06/2021 for a virtual visit (video or telephone). Mar Daring, PA-C  Date: 12/06/2021 2:03 PM  Virtual Visit via Video Note   I, Mar Daring, connected with  Leisha Trinkle Foisy  ([id number], 1985-10-11) on 12/06/21 at  2:00 PM EDT by a video-enabled telemedicine application and verified that I am speaking with the correct person using two  identifiers.  Location: Patient: Virtual Visit Location Patient: Home Provider: Virtual Visit Location Provider: Home Office   I discussed the limitations of evaluation and management by telemedicine and the availability of in person appointments. The patient expressed understanding and agreed to proceed.    History of Present Illness: Sonia [name] is a 36 y.o. who identifies as a female who was assigned female at birth, and is being seen today for possible sinus infection.  HPI: Sinusitis This is a new problem. The current episode started in the past 7 days (has chronic sinusitis with environmental allergies and had acute exacerbation last weekend; gradually worsening since). The problem has been gradually worsening since onset. There has been no fever. Associated symptoms include chills, congestion, coughing (nighttime), headaches, sinus pressure and a sore throat. Pertinent negatives include no ear pain, hoarse voice or swollen glands. (Eyes watering, ear popping with blowing nose) Treatments tried: ibuprfoen, mucinex, dayquil. The treatment provided no relief.     Problems:  Patient Active Problem List   Diagnosis Date Noted   Palpitations 11/26/2021   Abdominal pain, epigastric    Hypoglycemia without diagnosis of diabetes mellitus 05/20/2020   Hemochromatosis 05/20/2020   Acute pancreatitis 05/19/2020   Iron deficiency anemia due to chronic blood loss 02/14/2020   Moderate persistent asthma 09/06/2019   Insomnia 09/29/2018   Bulimia nervosa, purging type 09/15/2018   Moderate major depression ([place]) 09/15/2018   Crohn's disease of colon without complication ([place]) 09/73/5329   LUQ abdominal pain 03/01/2018   Crohn's disease (  [place]) 08/30/2014   Headache disorder 08/30/2014   Anxiety 06/18/2009   GERD 05/23/2009    Allergies:  Allergies  Allergen Reactions   Other Other (See Comments)    Mold, mildew, grass, fungus, trees cause swelling of eyes    Medications:  Current Outpatient Medications:    amoxicillin-clavulanate (AUGMENTIN) 875-125 MG tablet, Take 1 tablet by mouth 2 (two) times daily., Disp: 20 tablet, Rfl: 0   albuterol (VENTOLIN HFA) 108 (90 Base) MCG/ACT inhaler, Inhale 1-2 puffs into the lungs every 4 (four) hours as needed for wheezing or shortness of breath., Disp: 18 g, Rfl: 0   budesonide-formoterol (SYMBICORT) 80-4.5 MCG/ACT inhaler, Inhale 2 puffs into the lungs 2 (two) times a day., Disp: , Rfl:    buPROPion (WELLBUTRIN XL) 300 MG 24 hr tablet, Take 1 tablet (300 mg total) by mouth daily., Disp: 90 tablet, Rfl: 1   cetirizine (ZYRTEC) 10 MG tablet, Take 10 mg by mouth daily., Disp: , Rfl:    dicyclomine (BENTYL) 20 MG tablet, Take 1 tablet (20 mg total) by mouth 3 (three) times daily., Disp: 90 tablet, Rfl: 0   diphenhydrAMINE (BENADRYL) 25 mg capsule, Take 25 mg by mouth at bedtime as needed for allergies. For itching, Disp: , Rfl:    escitalopram (LEXAPRO) 20 MG tablet, Take 0.5 tablets (10 mg total) by mouth daily., Disp: 90 tablet, Rfl: 1   montelukast (SINGULAIR) 10 MG tablet, Take 10 mg by mouth at bedtime., Disp: , Rfl:    pantoprazole (PROTONIX) 40 MG tablet, TAKE 1 TABLET BY MOUTH EVERY DAY, Disp: 30 tablet, Rfl: 0   traZODone (DESYREL) 50 MG tablet, TAKE 1 TABLET BY MOUTH EVERYDAY AT BEDTIME, Disp: 90 tablet, Rfl: 1  Observations/Objective: Patient is well-developed, well-nourished in no acute distress.  Resting comfortably at home.  Head is normocephalic, atraumatic.  No labored breathing.  Speech is clear and coherent with logical content.  Patient is alert and oriented at baseline.    Assessment and Plan: 1. Acute bacterial sinusitis - amoxicillin-clavulanate (AUGMENTIN) 875-125 MG tablet; Take 1 tablet by mouth 2 (two) times daily.  Dispense: 20 tablet; Refill: 0  - Worsening symptoms that have not responded to OTC medications.  - Will give Augmentin - Continue allergy medications.  - Steam  and humidifier can help - Stay well hydrated and get plenty of rest.  - Seek in person evaluation if no symptom improvement or if symptoms worsen   Follow Up Instructions: I discussed the assessment and treatment plan with the patient. The patient was provided an opportunity to ask questions and all were answered. The patient agreed with the plan and demonstrated an understanding of the instructions.  A copy of instructions were sent to the patient via MyChart unless otherwise noted below.    The patient was advised to call back or seek an in-person evaluation if the symptoms worsen or if the condition fails to improve as anticipated.  Time:  I spent 10 minutes with the patient via telehealth technology discussing the above problems/concerns.    Mar Daring, PA-C

## 2021-12-06 NOTE — Patient Instructions (Signed)
Sonia [name], thank you for joining Mar Daring, PA-C for today's virtual visit.  While this provider is not your primary care provider (PCP), if your PCP is located in our provider database this encounter information will be shared with them immediately following your visit.  Consent: (Patient) Sonia [name] provided verbal consent for this virtual visit at the beginning of the encounter.  Current Medications:  Current Outpatient Medications:    amoxicillin-clavulanate (AUGMENTIN) 875-125 MG tablet, Take 1 tablet by mouth 2 (two) times daily., Disp: 20 tablet, Rfl: 0   albuterol (VENTOLIN HFA) 108 (90 Base) MCG/ACT inhaler, Inhale 1-2 puffs into the lungs every 4 (four) hours as needed for wheezing or shortness of breath., Disp: 18 g, Rfl: 0   budesonide-formoterol (SYMBICORT) 80-4.5 MCG/ACT inhaler, Inhale 2 puffs into the lungs 2 (two) times a day., Disp: , Rfl:    buPROPion (WELLBUTRIN XL) 300 MG 24 hr tablet, Take 1 tablet (300 mg total) by mouth daily., Disp: 90 tablet, Rfl: 1   cetirizine (ZYRTEC) 10 MG tablet, Take 10 mg by mouth daily., Disp: , Rfl:    dicyclomine (BENTYL) 20 MG tablet, Take 1 tablet (20 mg total) by mouth 3 (three) times daily., Disp: 90 tablet, Rfl: 0   diphenhydrAMINE (BENADRYL) 25 mg capsule, Take 25 mg by mouth at bedtime as needed for allergies. For itching, Disp: , Rfl:    escitalopram (LEXAPRO) 20 MG tablet, Take 0.5 tablets (10 mg total) by mouth daily., Disp: 90 tablet, Rfl: 1   montelukast (SINGULAIR) 10 MG tablet, Take 10 mg by mouth at bedtime., Disp: , Rfl:    pantoprazole (PROTONIX) 40 MG tablet, TAKE 1 TABLET BY MOUTH EVERY DAY, Disp: 30 tablet, Rfl: 0   traZODone (DESYREL) 50 MG tablet, TAKE 1 TABLET BY MOUTH EVERYDAY AT BEDTIME, Disp: 90 tablet, Rfl: 1   Medications ordered in this encounter:  Meds ordered this encounter  Medications   amoxicillin-clavulanate (AUGMENTIN) 875-125 MG tablet    Sig: Take 1  tablet by mouth 2 (two) times daily.    Dispense:  20 tablet    Refill:  0    Order Specific Question:   Supervising Provider    Answer:   Sabra Heck, BRIAN [[id number]]     *If you need refills on other medications prior to your next appointment, please contact your pharmacy*  Follow-Up: Call back or seek an in-person evaluation if the symptoms worsen or if the condition fails to improve as anticipated.  Other Instructions Sinus Infection, Adult A sinus infection, also called sinusitis, is inflammation of your sinuses. Sinuses are hollow spaces in the bones around your face. Your sinuses are located: Around your eyes. In the middle of your forehead. Behind your nose. In your cheekbones. Mucus normally drains out of your sinuses. When your nasal tissues become inflamed or swollen, mucus can become trapped or blocked. This allows bacteria, viruses, and fungi to grow, which leads to infection. Most infections of the sinuses are caused by a virus. A sinus infection can develop quickly. It can last for up to 4 weeks (acute) or for more than 12 weeks (chronic). A sinus infection often develops after a cold. What are the causes? This condition is caused by anything that creates swelling in the sinuses or stops mucus from draining. This includes: Allergies. Asthma. Infection from bacteria or viruses. Deformities or blockages in your nose or sinuses. Abnormal growths in the nose (nasal polyps). Pollutants, such as chemicals or irritants in the  air. Infection from fungi. This is rare. What increases the risk? You are more likely to develop this condition if you: Have a weak body defense system (immune system). Do a lot of swimming or diving. Overuse nasal sprays. Smoke. What are the signs or symptoms? The main symptoms of this condition are pain and a feeling of pressure around the affected sinuses. Other symptoms include: Stuffy nose or congestion that makes it difficult to breathe through your  nose. Thick yellow or greenish drainage from your nose. Tenderness, swelling, and warmth over the affected sinuses. A cough that may get worse at night. Decreased sense of smell and taste. Extra mucus that collects in the throat or the back of the nose (postnasal drip) causing a sore throat or bad breath. Tiredness (fatigue). Fever. How is this diagnosed? This condition is diagnosed based on: Your symptoms. Your medical history. A physical exam. Tests to find out if your condition is acute or chronic. This may include: Checking your nose for nasal polyps. Viewing your sinuses using a device that has a light (endoscope). Testing for allergies or bacteria. Imaging tests, such as an MRI or CT scan. In rare cases, a bone biopsy may be done to rule out more serious types of fungal sinus disease. How is this treated? Treatment for a sinus infection depends on the cause and whether your condition is chronic or acute. If caused by a virus, your symptoms should go away on their own within 10 days. You may be given medicines to relieve symptoms. They include: Medicines that shrink swollen nasal passages (decongestants). A spray that eases inflammation of the nostrils (topical intranasal corticosteroids). Rinses that help get rid of thick mucus in your nose (nasal saline washes). Medicines that treat allergies (antihistamines). Over-the-counter pain relievers. If caused by bacteria, your health care provider may recommend waiting to see if your symptoms improve. Most bacterial infections will get better without antibiotic medicine. You may be given antibiotics if you have: A severe infection. A weak immune system. If caused by narrow nasal passages or nasal polyps, surgery may be needed. Follow these instructions at home: Medicines Take, use, or apply over-the-counter and prescription medicines only as told by your health care provider. These may include nasal sprays. If you were prescribed an  antibiotic medicine, take it as told by your health care provider. Do not stop taking the antibiotic even if you start to feel better. Hydrate and humidify  Drink enough fluid to keep your urine pale yellow. Staying hydrated will help to thin your mucus. Use a cool mist humidifier to keep the humidity level in your home above 50%. Inhale steam for 10-15 minutes, 3-4 times a day, or as told by your health care provider. You can do this in the bathroom while a hot shower is running. Limit your exposure to cool or dry air. Rest Rest as much as possible. Sleep with your head raised (elevated). Make sure you get enough sleep each night. General instructions  Apply a warm, moist washcloth to your face 3-4 times a day or as told by your health care provider. This will help with discomfort. Use nasal saline washes as often as told by your health care provider. Wash your hands often with soap and water to reduce your exposure to germs. If soap and water are not available, use hand sanitizer. Do not smoke. Avoid being around people who are smoking (secondhand smoke). Keep all follow-up visits. This is important. Contact a health care provider if: You  have a fever. Your symptoms get worse. Your symptoms do not improve within 10 days. Get help right away if: You have a severe headache. You have persistent vomiting. You have severe pain or swelling around your face or eyes. You have vision problems. You develop confusion. Your neck is stiff. You have trouble breathing. These symptoms may be an emergency. Get help right away. Call 911. Do not wait to see if the symptoms will go away. Do not drive yourself to the hospital. Summary A sinus infection is soreness and inflammation of your sinuses. Sinuses are hollow spaces in the bones around your face. This condition is caused by nasal tissues that become inflamed or swollen. The swelling traps or blocks the flow of mucus. This allows bacteria,  viruses, and fungi to grow, which leads to infection. If you were prescribed an antibiotic medicine, take it as told by your health care provider. Do not stop taking the antibiotic even if you start to feel better. Keep all follow-up visits. This is important. This information is not intended to replace advice given to you by your health care provider. Make sure you discuss any questions you have with your health care provider. Document Revised: 04/09/2021 Document Reviewed: 04/09/2021 Elsevier Patient Education  [place].    If you have been instructed to have an in-person evaluation today at a local Urgent Care facility, please use the link below. It will take you to a list of all of our available [facility], including address, phone number and hours of operation. Please do not delay care.  [facility]  If you or a family member do not have a primary care provider, use the link below to schedule a visit and establish care. When you choose a [facility] primary care physician or advanced practice provider, you gain a long-term partner in health. Find a Primary Care Provider  Learn more about [facility] in-office and virtual care options: [facility]

## 2021-12-12 ENCOUNTER — Ambulatory Visit: Payer: BC Managed Care – PPO | Admitting: Psychology

## 2021-12-19 ENCOUNTER — Ambulatory Visit: Payer: BC Managed Care – PPO | Admitting: Psychology

## 2021-12-20 DIAGNOSIS — L308 Other specified dermatitis: Secondary | ICD-10-CM | POA: Diagnosis not present

## 2021-12-20 DIAGNOSIS — L2089 Other atopic dermatitis: Secondary | ICD-10-CM | POA: Diagnosis not present

## 2021-12-26 ENCOUNTER — Ambulatory Visit: Payer: BC Managed Care – PPO | Admitting: Psychology

## 2022-01-09 DIAGNOSIS — N915 Oligomenorrhea, unspecified: Secondary | ICD-10-CM | POA: Diagnosis not present

## 2022-01-09 DIAGNOSIS — Z6831 Body mass index (BMI) 31.0-31.9, adult: Secondary | ICD-10-CM | POA: Diagnosis not present

## 2022-01-09 DIAGNOSIS — Z01419 Encounter for gynecological examination (general) (routine) without abnormal findings: Secondary | ICD-10-CM | POA: Diagnosis not present

## 2022-01-09 DIAGNOSIS — Z3202 Encounter for pregnancy test, result negative: Secondary | ICD-10-CM | POA: Diagnosis not present

## 2022-01-09 DIAGNOSIS — Z0142 Encounter for cervical smear to confirm findings of recent normal smear following initial abnormal smear: Secondary | ICD-10-CM | POA: Diagnosis not present

## 2022-01-10 DIAGNOSIS — J3089 Other allergic rhinitis: Secondary | ICD-10-CM | POA: Diagnosis not present

## 2022-01-10 DIAGNOSIS — J3081 Allergic rhinitis due to animal (cat) (dog) hair and dander: Secondary | ICD-10-CM | POA: Diagnosis not present

## 2022-01-10 DIAGNOSIS — J301 Allergic rhinitis due to pollen: Secondary | ICD-10-CM | POA: Diagnosis not present

## 2022-01-14 ENCOUNTER — Other Ambulatory Visit: Payer: Self-pay | Admitting: *Deleted

## 2022-01-14 DIAGNOSIS — F419 Anxiety disorder, unspecified: Secondary | ICD-10-CM

## 2022-01-14 DIAGNOSIS — F339 Major depressive disorder, recurrent, unspecified: Secondary | ICD-10-CM

## 2022-01-14 MED ORDER — BUPROPION HCL ER (XL) 300 MG PO TB24: 300.0000 mg | ORAL_TABLET | Freq: Every day | ORAL | 0 refills | Status: DC

## 2022-01-14 MED ORDER — TRAZODONE HCL 50 MG PO TABS: ORAL_TABLET | ORAL | 0 refills | Status: DC

## 2022-01-14 MED ORDER — ESCITALOPRAM OXALATE 20 MG PO TABS: 10.0000 mg | ORAL_TABLET | Freq: Every day | ORAL | 0 refills | Status: DC

## 2022-01-14 NOTE — Telephone Encounter (Signed)
Ok to fill but she needs an appt with me in Sept according to Dr. Doug Sou last note

## 2022-01-15 ENCOUNTER — Telehealth: Payer: Self-pay | Admitting: Gastroenterology

## 2022-01-15 ENCOUNTER — Other Ambulatory Visit: Payer: Self-pay | Admitting: Gastroenterology

## 2022-01-15 ENCOUNTER — Other Ambulatory Visit: Payer: Self-pay | Admitting: Family Medicine

## 2022-01-15 NOTE — Telephone Encounter (Signed)
Patient called, states she requested refill on pantoprazole 40 mg through my chart, and it was denied. Requesting a call back

## 2022-01-15 NOTE — Telephone Encounter (Signed)
Called patient with no answer and voicemail box is full. Could not leave a message to inform patient she is due for follow up visit.

## 2022-01-17 NOTE — Telephone Encounter (Signed)
Called patient with no answer and voicemail box is full so could not leave a message.

## 2022-01-24 ENCOUNTER — Encounter: Payer: Self-pay | Admitting: Family Medicine

## 2022-01-24 ENCOUNTER — Ambulatory Visit: Payer: BC Managed Care – PPO | Admitting: Family Medicine

## 2022-01-24 ENCOUNTER — Other Ambulatory Visit: Payer: Self-pay | Admitting: Family Medicine

## 2022-01-24 VITALS — BP 94/60 | HR 70 | Temp 98.4°F | Ht 62.0 in | Wt 173.8 lb

## 2022-01-24 DIAGNOSIS — F339 Major depressive disorder, recurrent, unspecified: Secondary | ICD-10-CM | POA: Diagnosis not present

## 2022-01-24 DIAGNOSIS — F419 Anxiety disorder, unspecified: Secondary | ICD-10-CM

## 2022-01-24 DIAGNOSIS — F5081 Binge eating disorder: Secondary | ICD-10-CM | POA: Diagnosis not present

## 2022-01-24 DIAGNOSIS — F321 Major depressive disorder, single episode, moderate: Secondary | ICD-10-CM

## 2022-01-24 MED ORDER — BUPROPION HCL ER (XL) 150 MG PO TB24: 300.0000 mg | ORAL_TABLET | Freq: Every day | ORAL | 1 refills | Status: DC

## 2022-01-24 MED ORDER — ESCITALOPRAM OXALATE 5 MG PO TABS: 5.0000 mg | ORAL_TABLET | Freq: Every day | ORAL | 0 refills | Status: DC

## 2022-01-24 MED ORDER — LISDEXAMFETAMINE DIMESYLATE 20 MG PO CAPS: 20.0000 mg | ORAL_CAPSULE | Freq: Every day | ORAL | 0 refills | Status: DC

## 2022-01-24 NOTE — Progress Notes (Unsigned)
   Established Patient Office Visit  Subjective   Patient ID: Sonia [name], female    DOB: 1985-07-07  Age: 36 y.o. MRN: [id number]  Chief Complaint  Patient presents with   Establish Care    Patient is here for transition of care visit. Patient reports she has a history of eating disorders in the past, states that she started taking the lexapro, states she gained weight on the medication and she wanted to come off the medication. She reports that the wellbutrin works better but it is not helping with the binging portion. States that she has gained weight consistently over the last 2 years. She denies any recent purging.  States that she wants to come off lexapro due to the weight gain. States tat     {History (Optional):23778}  ROS    Objective:     BP 94/60 (BP Location: Left Arm, Patient Position: Sitting, Cuff Size: Normal)   Pulse 70   Temp 98.4 F (36.9 C) (Oral)   Ht 5' 2"  (1.575 m)   Wt 173 lb 12.8 oz (78.8 kg)   SpO2 100%   BMI 31.79 kg/m  {Vitals History (Optional):23777}  Physical Exam   No results found for any visits on 01/24/22.  {Labs (Optional):23779}  The ASCVD Risk score (Arnett DK, et al., 2019) failed to calculate for the following reasons:   The 2019 ASCVD risk score is only valid for ages 16 to 108    Assessment & Plan:   Problem List Items Addressed This Visit       Other   Anxiety - Primary   Moderate major depression ([place])   Binge eating disorder (Chronic)   Other Visit Diagnoses     Depression, recurrent ([place])           Return in about 1 month (around 02/23/2022) for video visit follow up ADHD.    Farrel Conners, MD

## 2022-01-27 ENCOUNTER — Encounter: Payer: Self-pay | Admitting: Family Medicine

## 2022-01-28 NOTE — Assessment & Plan Note (Signed)
Controlled substance agreement reviewed and signed, will start vyvanse 20 mg daily and will follow up with her in 1 month to reassess her symptoms, may need to titrate up to 40 mg daily depending on her response to the medication.

## 2022-01-28 NOTE — Assessment & Plan Note (Signed)
Patient reports her anxiety is well controlled, she already reduced her dose to 10 mg at home for the last few months, she would like to decreased down to 5 mg daily and continue the wellbutrin since this is working very well for her. Will reduce to 5 mg daily of lexapro and continue the wellbutrin 300 mg daily.

## 2022-02-16 NOTE — Progress Notes (Deleted)
02/16/2022 Sonia [name] [id number] 02/09/1986   Chief Complaint:  History of Present Illness: Sonia [name] is a 36 year old female with a past medical history of anxiety, depression, pancreatitis, GERD and Crohn's ileocolitis. She is followed by Dr. Fuller Plan.  On Pantoprazole 24m QD     Latest Ref Rng & Units 11/22/2021    1:02 PM 04/29/2021    1:12 PM 02/20/2021    9:50 AM  CBC  WBC 4.0 - 10.5 K/uL 6.1  6.0  5.0   Hemoglobin 12.0 - 15.0 g/dL 12.1  12.6  13.4   Hematocrit 36.0 - 46.0 % 37.1  38.1  41.3   Platelets 150.0 - 400.0 K/uL 331.0  320.0  292        Latest Ref Rng & Units 11/22/2021    1:02 PM 05/16/2021    8:38 AM 04/29/2021    1:12 PM  CMP  Glucose 70 - 99 mg/dL 91   85   BUN 6 - 23 mg/dL 10   12   Creatinine 0.40 - 1.20 mg/dL 0.84   0.80   Sodium 135 - 145 mEq/L 137   139   Potassium 3.5 - 5.1 mEq/L 3.9   4.1   Chloride 96 - 112 mEq/L 100   101   CO2 19 - 32 mEq/L 31   30   Calcium 8.4 - 10.5 mg/dL 9.7   9.5   Total Protein 6.0 - 8.3 g/dL 7.1  6.3  6.6   Total Bilirubin 0.2 - 1.2 mg/dL 0.5  0.3  0.3   Alkaline Phos 39 - 117 U/L 31  28  36   AST 0 - 37 U/L 18  19  51   ALT 0 - 35 U/L 22  21  62     Abdominal MRI/MRCP 05/19/2020:   Multiplanar multisequence MR imaging of the abdomen was performed following the administration of intravenous contrast. Heavily T2-weighted images of the biliary and pancreatic ducts were obtained, and three-dimensional MRCP images were rendered by post processing.   CONTRAST:  728mGADAVIST GADOBUTROL 1 MMOL/ML IV SOLN   COMPARISON:  Abdominal MRI 03/11/2018.   FINDINGS: Lower chest: Unremarkable.   Hepatobiliary: Diffuse loss of signal intensity throughout the hepatic parenchyma on in phase dual echo images, as well as diffuse low signal intensity on T2 weighted images, indicative of hepatic iron deposition. This is slightly heterogeneous, with more focal iron deposition in segment 6 of the liver.  No discrete cystic or solid hepatic lesions are otherwise noted. No intra or extrahepatic biliary ductal dilatation. Gallbladder is normal in appearance.   Pancreas: No pancreatic mass. No pancreatic ductal dilatation. Small amount of peripancreatic T2 signal intensity indicates inflammation and trace amount of peripancreatic fluid. No well-defined peripancreatic fluid collection to suggest pseudocyst. Pancreatic parenchyma enhances normally without definitive regions of pancreatic necrosis.   Spleen: Mild diffuse loss of signal intensity throughout the splenic parenchyma on in phase dual echo images, and mild T2 hypointensity, indicative of mild splenic iron deposition.   Adrenals/Urinary Tract: Bilateral kidneys and adrenal glands are normal in appearance. No hydroureteronephrosis in the visualized portions of the abdomen.   Stomach/Bowel: Visualized portions are unremarkable.   Vascular/Lymphatic: No aneurysm identified in the visualized abdominal vasculature. No lymphadenopathy noted in the abdomen.   Other: No significant volume of ascites noted in the visualized portions of the peritoneal cavity.   Musculoskeletal: No aggressive appearing osseous lesions are noted in the visualized portions of  the skeleton.   IMPRESSION: 1. Subtle inflammatory changes around the pancreas compatible with reported clinical history of pancreatitis. No evidence of pancreatic necrosis, and no pancreatic pseudocyst or other acute complicating features noted at this time. 2. Interval development of both hepatic and splenic iron deposition, as above. 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 Correlation with clinical history of blood transfusions or other cause of secondary hemochromatosis is recommended.   Colonoscopy 07/18/2020: - The examined portion of the ileum was normal. Biopsied. - Internal hemorrhoids. - The examination was otherwise normal on direct and retroflexion  views. Biopsied.  EGD 05/24/2020: - Normal esophagus. - Normal stomach. Biopsied. - Normal examined duodenum. Biopsied. A. DUODENUM, BIOPSY:  - No significant pathologic findings.  - Negative for increased intraepithelial lymphocytes and villous  architectural changes.   B. STOMACH, BIOPSY:  - Mild chronic gastritis.  - Warthin-Starry stain is negative for Helicobacter pylori.  Past Medical History:  Diagnosis Date   Allergy    Anemia    Anxiety    Asthma    Blood transfusion without reported diagnosis    iron infusion   COVID-19    Crohn's colitis ([place])    Depression    GERD (gastroesophageal reflux disease)    no meds   Normal labor 03/27/2013   Postpartum care following cesarean delivery (11/9) 03/27/2013   Past Surgical History:  Procedure Laterality Date   BIOPSY  05/24/2020   Procedure: BIOPSY;  Surgeon: Jerene Bears, MD;  Location: [place];  Service: Gastroenterology;;   CESAREAN SECTION N/A 03/27/2013   Procedure: CESAREAN SECTION;  Surgeon: Lovenia Kim, MD;  Location: [facility];  Service: Obstetrics;  Laterality: N/A;   CESAREAN SECTION N/A 09/04/2014   Procedure: REPEAT CESAREAN SECTION;  Surgeon: Crawford Givens, MD;  Location: [facility];  Service: Obstetrics;  Laterality: N/A;   ESOPHAGOGASTRODUODENOSCOPY (EGD) WITH PROPOFOL N/A 05/24/2020   Procedure: ESOPHAGOGASTRODUODENOSCOPY (EGD) WITH PROPOFOL;  Surgeon: Jerene Bears, MD;  Location: [facility];  Service: Gastroenterology;  Laterality: N/A;   HAND SURGERY  2012   with nerve block   LAPAROSCOPIC ENDOMETRIOSIS FULGURATION     WISDOM TOOTH EXTRACTION         Current Medications, Allergies, Past Medical History, Past Surgical History, Family History and Social History were reviewed in [employer] record.   Review of Systems:   Constitutional: Negative for fever, sweats, chills or weight loss.  Respiratory: Negative for shortness of breath.   Cardiovascular: Negative for chest  pain, palpitations and leg swelling.  Gastrointestinal: See HPI.  Musculoskeletal: Negative for back pain or muscle aches.  Neurological: Negative for dizziness, headaches or paresthesias.    Physical Exam: There were no vitals taken for this visit. General: Well developed, w   ***female in no acute distress. Head: Normocephalic and atraumatic. Eyes: No scleral icterus. Conjunctiva pink . Ears: Normal auditory acuity. Mouth: Dentition intact. No ulcers or lesions.  Lungs: Clear throughout to auscultation. Heart: Regular rate and rhythm, no murmur. Abdomen: Soft, nontender and nondistended. No masses or hepatomegaly. Normal bowel sounds x 4 quadrants.  Rectal: *** Musculoskeletal: Symmetrical with no gross deformities. Extremities: No edema. Neurological: Alert oriented x 4. No focal deficits.  Psychological: Alert and cooperative. Normal mood and affect  Assessment and Recommendations: ***

## 2022-02-17 ENCOUNTER — Ambulatory Visit: Payer: BC Managed Care – PPO | Admitting: Nurse Practitioner

## 2022-02-23 ENCOUNTER — Other Ambulatory Visit: Payer: Self-pay | Admitting: Family Medicine

## 2022-02-23 DIAGNOSIS — F339 Major depressive disorder, recurrent, unspecified: Secondary | ICD-10-CM

## 2022-02-24 ENCOUNTER — Encounter: Payer: Self-pay | Admitting: Family Medicine

## 2022-02-24 ENCOUNTER — Telehealth: Payer: BC Managed Care – PPO | Admitting: Family Medicine

## 2022-02-24 VITALS — Wt 168.0 lb

## 2022-02-24 DIAGNOSIS — F321 Major depressive disorder, single episode, moderate: Secondary | ICD-10-CM | POA: Diagnosis not present

## 2022-02-24 DIAGNOSIS — F339 Major depressive disorder, recurrent, unspecified: Secondary | ICD-10-CM

## 2022-02-24 DIAGNOSIS — F5081 Binge eating disorder: Secondary | ICD-10-CM

## 2022-02-24 MED ORDER — BUPROPION HCL ER (XL) 150 MG PO TB24: 150.0000 mg | ORAL_TABLET | Freq: Every day | ORAL | 1 refills | Status: DC

## 2022-02-24 MED ORDER — LISDEXAMFETAMINE DIMESYLATE 30 MG PO CAPS: 30.0000 mg | ORAL_CAPSULE | Freq: Every day | ORAL | 0 refills | Status: DC

## 2022-02-24 NOTE — Assessment & Plan Note (Signed)
Symptoms are under good control, she has reduced her wellbutrin to 1 tablet daily, will change it in the med list to reflect this.

## 2022-02-24 NOTE — Assessment & Plan Note (Signed)
Pt reports improvement and better control of her eating with the vyvanse, no side effects are currently present. Will increase to the 30 mg dose and she will call the office for refills and let us know if she wishes to stay on the 30 mg dose. I will see her in 3 months for medication refills via video visit.

## 2022-02-24 NOTE — Progress Notes (Signed)
Established Patient Office Visit  Subjective   Patient ID: Sonia [name], female    DOB: April 27, 1986  Age: 36 y.o. MRN: [id number]  Chief Complaint  Patient presents with   Follow-up   I connected with  Cristy Hilts Lill on 02/24/22 by a video enabled telemedicine application and verified that I am speaking with the correct person using two identifiers.   I discussed the limitations of evaluation and management by telemedicine. The patient expressed understanding and agreed to proceed.   Patient location: her home address  Provider location: [facility] office  Patient reports that she started the vyvanse and she is definitely seeing an improvement in her binge eating disorder. States that initially when she started the medication she had a little trouble sleeping, however this side effect has resolved. States that she reduced her wellbutrin to 1 tablet daily and is doing well on this dose. She denies any depressive symptoms, states that she feels like her eating is under much better control. She would like to try increasing the dose to 30 mg daily. I will call in a new rx for her.    Current Outpatient Medications  Medication Instructions   albuterol (VENTOLIN HFA) 108 (90 Base) MCG/ACT inhaler 1-2 puffs, Inhalation, Every 4 hours PRN   budesonide-formoterol (SYMBICORT) 80-4.5 MCG/ACT inhaler 2 puffs, Inhalation, 2 times daily   buPROPion (WELLBUTRIN XL) 150 mg, Oral, Daily   cetirizine (ZYRTEC) 10 mg, Oral, Daily   dicyclomine (BENTYL) 20 mg, Oral, 3 times daily   diphenhydrAMINE (BENADRYL) 25 mg, At bedtime PRN   escitalopram (LEXAPRO) 5 mg, Oral, Daily   lisdexamfetamine (VYVANSE) 30 mg, Oral, Daily   montelukast (SINGULAIR) 10 mg, Oral, Daily at bedtime   pantoprazole (PROTONIX) 40 MG tablet TAKE 1 TABLET BY MOUTH EVERY DAY   traZODone (DESYREL) 50 MG tablet TAKE 1 TABLET BY MOUTH EVERYDAY AT BEDTIME       Review of Systems  All other  systems reviewed and are negative.     Objective:     Wt 168 lb (76.2 kg)   BMI 30.73 kg/m    Physical Exam Constitutional:      Appearance: Normal appearance. She is normal weight.  Eyes:     Conjunctiva/sclera: Conjunctivae normal.  Pulmonary:     Effort: Pulmonary effort is normal.  Neurological:     Mental Status: She is alert and oriented to person, place, and time. Mental status is at baseline.  Psychiatric:        Mood and Affect: Mood normal.        Behavior: Behavior normal.        Judgment: Judgment normal.      No results found for any visits on 02/24/22.    The ASCVD Risk score (Arnett DK, et al., 2019) failed to calculate for the following reasons:   The 2019 ASCVD risk score is only valid for ages 46 to 67    Assessment & Plan:   Problem List Items Addressed This Visit       Other   Moderate major depression ([place])    Symptoms are under good control, she has reduced her wellbutrin to 1 tablet daily, will change it in the med list to reflect this.      Relevant Medications   buPROPion (WELLBUTRIN XL) 150 MG 24 hr tablet   Binge eating disorder - Primary (Chronic)    Pt reports improvement and better control of her eating with the vyvanse, no  side effects are currently present. Will increase to the 30 mg dose and she will call the office for refills and let us know if she wishes to stay on the 30 mg dose. I will see her in 3 months for medication refills via video visit.       Relevant Medications   lisdexamfetamine (VYVANSE) 30 MG capsule   Other Visit Diagnoses     Depression, recurrent ([place])       Relevant Medications   buPROPion (WELLBUTRIN XL) 150 MG 24 hr tablet      I spent 14 minutes  Return in about 3 months (around 05/27/2022) for video visit for medication refills.Farrel Conners, MD

## 2022-03-26 ENCOUNTER — Other Ambulatory Visit: Payer: Self-pay | Admitting: Family Medicine

## 2022-03-26 DIAGNOSIS — F5081 Binge eating disorder: Secondary | ICD-10-CM

## 2022-03-26 MED ORDER — LISDEXAMFETAMINE DIMESYLATE 30 MG PO CAPS: 30.0000 mg | ORAL_CAPSULE | Freq: Every day | ORAL | 0 refills | Status: DC

## 2022-04-22 ENCOUNTER — Other Ambulatory Visit: Payer: Self-pay | Admitting: Family Medicine

## 2022-04-22 DIAGNOSIS — F419 Anxiety disorder, unspecified: Secondary | ICD-10-CM

## 2022-04-22 DIAGNOSIS — F5081 Binge eating disorder: Secondary | ICD-10-CM

## 2022-04-22 MED ORDER — LISDEXAMFETAMINE DIMESYLATE 30 MG PO CAPS: 30.0000 mg | ORAL_CAPSULE | Freq: Every day | ORAL | 0 refills | Status: DC

## 2022-04-23 ENCOUNTER — Encounter: Payer: Self-pay | Admitting: Family Medicine

## 2022-04-23 ENCOUNTER — Other Ambulatory Visit (HOSPITAL_BASED_OUTPATIENT_CLINIC_OR_DEPARTMENT_OTHER): Payer: Self-pay

## 2022-04-23 DIAGNOSIS — F5081 Binge eating disorder: Secondary | ICD-10-CM

## 2022-04-23 MED ORDER — LISDEXAMFETAMINE DIMESYLATE 30 MG PO CAPS
30.0000 mg | ORAL_CAPSULE | Freq: Every day | ORAL | 0 refills | Status: DC
  Filled ????-??-??: fill #0

## 2022-04-24 ENCOUNTER — Telehealth (INDEPENDENT_AMBULATORY_CARE_PROVIDER_SITE_OTHER): Payer: BC Managed Care – PPO | Admitting: Family Medicine

## 2022-04-24 ENCOUNTER — Other Ambulatory Visit (HOSPITAL_BASED_OUTPATIENT_CLINIC_OR_DEPARTMENT_OTHER): Payer: Self-pay

## 2022-04-24 ENCOUNTER — Encounter (HOSPITAL_COMMUNITY): Payer: Self-pay | Admitting: Hematology

## 2022-04-24 ENCOUNTER — Encounter: Payer: Self-pay | Admitting: Family Medicine

## 2022-04-24 VITALS — Wt 167.0 lb

## 2022-04-24 DIAGNOSIS — F5081 Binge eating disorder: Secondary | ICD-10-CM | POA: Diagnosis not present

## 2022-04-24 DIAGNOSIS — F419 Anxiety disorder, unspecified: Secondary | ICD-10-CM | POA: Diagnosis not present

## 2022-04-24 MED ORDER — LISDEXAMFETAMINE DIMESYLATE 40 MG PO CAPS
40.0000 mg | ORAL_CAPSULE | ORAL | 0 refills | Status: DC
  Filled 2022-04-24: qty 30, 30d supply, fill #0

## 2022-04-24 MED ORDER — ESCITALOPRAM OXALATE 10 MG PO TABS: 10.0000 mg | ORAL_TABLET | Freq: Every day | ORAL | 1 refills | Status: DC

## 2022-04-24 NOTE — Assessment & Plan Note (Signed)
Will increase dose to 40 mg daily of the vyvanse, new rx sent to the pharmacy today. Will perform a follow up in 3 months to reassess her symptoms.

## 2022-04-24 NOTE — Assessment & Plan Note (Signed)
Will increase back up to 10 mg daily on the lexparo, new rx sent to the pharmacy.

## 2022-04-24 NOTE — Progress Notes (Signed)
Established Patient Office Visit  Subjective   Patient ID: Sonia [name], female    DOB: 09/18/1985  Age: 36 y.o. MRN: [id number]  Chief Complaint  Patient presents with   Medication Refill   I connected with  Sonia [name] on 04/24/22 by a video enabled telemedicine application and verified that I am speaking with the correct person using two identifiers.   I discussed the limitations of evaluation and management by telemedicine. The patient expressed understanding and agreed to proceed.   Patient location: her place of business [place] location: [employer] office  Pt being seen today for follow up and medication refills. She is reporting an increase in depression symptoms since the last visit. States that she would like to go back up to the 10 mg lexapro daily.   Binge eating disorder-- pt reports that the 30 mg dose was better than the 20 mg, however she is having difficulty controlling the symptoms. Especially since she increased the lexapro to 10 mg. We discussed increasing to the 40 mg dose and she is agreeable to this change.   Current Outpatient Medications  Medication Instructions   albuterol (VENTOLIN HFA) 108 (90 Base) MCG/ACT inhaler 1-2 puffs, Inhalation, Every 4 hours PRN   budesonide-formoterol (SYMBICORT) 80-4.5 MCG/ACT inhaler 2 puffs, Inhalation, 2 times daily   buPROPion (WELLBUTRIN XL) 150 mg, Oral, Daily   cetirizine (ZYRTEC) 10 mg, Oral, Daily   dicyclomine (BENTYL) 20 mg, Oral, 3 times daily   diphenhydrAMINE (BENADRYL) 25 mg, At bedtime PRN   escitalopram (LEXAPRO) 10 mg, Oral, Daily   lisdexamfetamine (VYVANSE) 40 mg, Oral, BH-each morning   montelukast (SINGULAIR) 10 mg, Oral, Daily at bedtime   pantoprazole (PROTONIX) 40 MG tablet TAKE 1 TABLET BY MOUTH EVERY DAY   traZODone (DESYREL) 50 MG tablet TAKE 1 TABLET BY MOUTH EVERYDAY AT BEDTIME    Patient Active Problem List   Diagnosis Date Noted   Binge eating  disorder 01/24/2022   Palpitations 11/26/2021   Abdominal pain, epigastric    Hypoglycemia without diagnosis of diabetes mellitus 05/20/2020   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     Objective:     Wt 167 lb (75.8 kg)   LMP 03/31/2022   BMI 30.54 kg/m    Physical Exam Vitals reviewed.  Constitutional:      Appearance: Normal appearance. She is normal weight.  Pulmonary:     Effort: Pulmonary effort is normal.  Neurological:     General: No focal deficit present.     Mental Status: She is alert and oriented to person, place, and time. Mental status is at baseline.  Psychiatric:        Mood and Affect: Mood normal.        Behavior: Behavior normal.        Thought Content: Thought content normal.        Judgment: Judgment normal.      No results found for any visits on 04/24/22.  The ASCVD Risk score (Arnett DK, et al., 2019) failed to calculate for the following reasons:   The 2019 ASCVD risk score is only valid for ages 27 to 4    Assessment & Plan:   Problem List Items Addressed This Visit       Unprioritized   Anxiety    Will increase back up to 10 mg daily on the lexparo, new rx sent to the pharmacy.      Relevant Medications   escitalopram (LEXAPRO) 10 MG tablet   Binge eating disorder - Primary (Chronic)    Will increase dose to 40 mg daily of the vyvanse, new rx sent to the pharmacy today. Will perform a follow up in 3 months to reassess  her symptoms.      Relevant Medications   lisdexamfetamine (VYVANSE) 40 MG capsule   I spent 20 minutes with patient today discussing dosages, going over her symptoms and giving recommendations.  Return in about 3 months (around 07/24/2022) for video visit for medication refills.    Farrel Conners, MD

## 2022-05-23 ENCOUNTER — Other Ambulatory Visit: Payer: Self-pay | Admitting: Family Medicine

## 2022-05-23 DIAGNOSIS — F5081 Binge eating disorder: Secondary | ICD-10-CM

## 2022-05-26 ENCOUNTER — Encounter (HOSPITAL_COMMUNITY): Payer: Self-pay | Admitting: Hematology

## 2022-05-26 ENCOUNTER — Other Ambulatory Visit (HOSPITAL_BASED_OUTPATIENT_CLINIC_OR_DEPARTMENT_OTHER): Payer: Self-pay

## 2022-05-26 MED ORDER — LISDEXAMFETAMINE DIMESYLATE 40 MG PO CAPS
40.0000 mg | ORAL_CAPSULE | ORAL | 0 refills | Status: DC
  Filled 2022-05-26: qty 30, 30d supply, fill #0

## 2022-06-24 ENCOUNTER — Encounter (HOSPITAL_COMMUNITY): Payer: Self-pay | Admitting: Hematology

## 2022-06-24 ENCOUNTER — Ambulatory Visit: Payer: 59 | Admitting: Family Medicine

## 2022-06-24 ENCOUNTER — Other Ambulatory Visit (HOSPITAL_BASED_OUTPATIENT_CLINIC_OR_DEPARTMENT_OTHER): Payer: Self-pay

## 2022-06-24 VITALS — BP 122/80 | HR 73 | Temp 98.3°F | Ht 62.0 in | Wt 165.8 lb

## 2022-06-24 DIAGNOSIS — E559 Vitamin D deficiency, unspecified: Secondary | ICD-10-CM

## 2022-06-24 DIAGNOSIS — F5081 Binge eating disorder: Secondary | ICD-10-CM | POA: Diagnosis not present

## 2022-06-24 DIAGNOSIS — F339 Major depressive disorder, recurrent, unspecified: Secondary | ICD-10-CM

## 2022-06-24 DIAGNOSIS — F419 Anxiety disorder, unspecified: Secondary | ICD-10-CM

## 2022-06-24 DIAGNOSIS — G4700 Insomnia, unspecified: Secondary | ICD-10-CM

## 2022-06-24 LAB — VITAMIN D 25 HYDROXY (VIT D DEFICIENCY, FRACTURES): VITD: 24.11 ng/mL — ABNORMAL LOW (ref 30.00–100.00)

## 2022-06-24 MED ORDER — BUPROPION HCL ER (XL) 150 MG PO TB24
150.0000 mg | ORAL_TABLET | Freq: Every day | ORAL | 1 refills | Status: DC
  Filled 2022-06-24: qty 30, 30d supply, fill #0
  Filled 2022-07-21: qty 30, 30d supply, fill #1
  Filled 2022-08-26: qty 30, 30d supply, fill #2
  Filled 2022-09-26: qty 30, 30d supply, fill #3
  Filled 2022-11-17: qty 30, 30d supply, fill #4
  Filled 2023-02-10: qty 30, 30d supply, fill #5

## 2022-06-24 MED ORDER — AMPHETAMINE-DEXTROAMPHETAMINE 10 MG PO TABS
10.0000 mg | ORAL_TABLET | Freq: Every day | ORAL | 0 refills | Status: DC
  Filled 2022-06-24: qty 30, 30d supply, fill #0

## 2022-06-24 MED ORDER — LISDEXAMFETAMINE DIMESYLATE 30 MG PO CAPS
30.0000 mg | ORAL_CAPSULE | Freq: Every day | ORAL | 0 refills | Status: DC
  Filled 2022-09-05: qty 30, 30d supply, fill #0

## 2022-06-24 MED ORDER — TRAZODONE HCL 50 MG PO TABS
50.0000 mg | ORAL_TABLET | Freq: Every day | ORAL | 3 refills | Status: DC
  Filled 2022-06-24: qty 30, 30d supply, fill #0
  Filled 2022-07-21: qty 30, 30d supply, fill #1
  Filled 2022-08-26: qty 30, 30d supply, fill #2
  Filled 2022-10-19: qty 30, 30d supply, fill #3

## 2022-06-24 MED ORDER — ESCITALOPRAM OXALATE 10 MG PO TABS
10.0000 mg | ORAL_TABLET | Freq: Every day | ORAL | 1 refills | Status: DC
  Filled 2022-06-24: qty 30, 30d supply, fill #0
  Filled 2022-07-21: qty 30, 30d supply, fill #1
  Filled 2022-08-26: qty 30, 30d supply, fill #2
  Filled 2022-09-26: qty 30, 30d supply, fill #3

## 2022-06-24 MED ORDER — LISDEXAMFETAMINE DIMESYLATE 30 MG PO CAPS
30.0000 mg | ORAL_CAPSULE | Freq: Every day | ORAL | 0 refills | Status: DC
  Filled 2022-06-24: qty 30, 30d supply, fill #0

## 2022-06-24 MED ORDER — LISDEXAMFETAMINE DIMESYLATE 30 MG PO CAPS
30.0000 mg | ORAL_CAPSULE | Freq: Every day | ORAL | 0 refills | Status: DC
  Filled 2022-07-29: qty 30, 30d supply, fill #0

## 2022-06-24 NOTE — Progress Notes (Signed)
Established Patient Office Visit  Subjective   Patient ID: Sonia [name], female    DOB: 1985/10/19  Age: 37 y.o. MRN: [id number]  Chief Complaint  Patient presents with   Medical Management of Chronic Issues    Pt is here for follow up medication refills. Patient states she did not like the  40 mg dose, wants to go back down to 30 mg. States that the medications seems to be wearing off around 4 pm in the afternoon. We discussed changing her medication back to 30 mg in the morning and taking a 10 mg in the afternoon. Pt is agreeable to this plan.   Patient is changing pharmacies and needs her medication refills sent there. I have sent refills on these for her today.    Current Outpatient Medications  Medication Instructions   albuterol (VENTOLIN HFA) 108 (90 Base) MCG/ACT inhaler 1-2 puffs, Inhalation, Every 4 hours PRN   amphetamine-dextroamphetamine (ADDERALL) 10 MG tablet 10 mg, Oral, Daily after lunch   budesonide-formoterol (SYMBICORT) 80-4.5 MCG/ACT inhaler 2 puffs, Inhalation, 2 times daily   buPROPion (WELLBUTRIN XL) 150 mg, Oral, Daily   cetirizine (ZYRTEC) 10 mg, Oral, Daily   dicyclomine (BENTYL) 20 mg, Oral, 3 times daily   diphenhydrAMINE (BENADRYL) 25 mg, At bedtime PRN   escitalopram (LEXAPRO) 10 mg, Oral, Daily   lisdexamfetamine (VYVANSE) 30 mg, Oral, Daily   [START ON 07/18/2022] lisdexamfetamine (VYVANSE) 30 mg, Oral, Daily   [START ON 08/18/2022] lisdexamfetamine (VYVANSE) 30 mg, Oral, Daily   montelukast (SINGULAIR) 10 mg, Oral, Daily at bedtime   pantoprazole (PROTONIX) 40 MG tablet TAKE 1 TABLET BY MOUTH EVERY DAY   traZODone (DESYREL) 50 mg, Oral, Daily at bedtime    Patient Active Problem List   Diagnosis Date Noted   Binge eating disorder 01/24/2022   Palpitations 11/26/2021   Abdominal pain, epigastric    Hypoglycemia without diagnosis of diabetes mellitus 05/20/2020   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     Objective:     BP 122/80 (BP Location: Left Arm, Patient Position: Sitting, Cuff Size: Normal)   Pulse 73   Temp 98.3 F (36.8 C) (Oral)   Ht 5\' 2"  (1.575 m)   Wt 165 lb 12.8 oz (75.2 kg)   LMP 06/19/2022   SpO2 100%   BMI 30.33 kg/m    Physical Exam Vitals reviewed.  Constitutional:      Appearance: Normal appearance. She is well-groomed and normal weight.  Cardiovascular:     Rate and Rhythm: Normal rate and regular rhythm.     Pulses: Normal pulses.     Heart sounds: S1 normal and S2 normal.  Pulmonary:     Effort: Pulmonary effort is normal.     Breath sounds: Normal air entry.  Neurological:     Mental Status: She is alert and oriented to person, place, and time. Mental status is at baseline.     Gait: Gait is intact.  Psychiatric:  Mood and Affect: Mood and affect normal.        Speech: Speech normal.        Behavior: Behavior normal.        Judgment: Judgment normal.       The ASCVD Risk score (Arnett DK, et al., 2019) failed to calculate for the following reasons:   The 2019 ASCVD risk score is only valid for ages 35 to 24    Assessment & Plan:   Problem List Items Addressed This Visit       Unprioritized   Anxiety   Relevant Medications   traZODone (DESYREL) 50 MG tablet   escitalopram (LEXAPRO) 10 MG tablet   buPROPion (WELLBUTRIN XL) 150 MG 24 hr tablet   Insomnia   Relevant Medications   traZODone (DESYREL) 50 MG tablet   Binge eating disorder - Primary  (Chronic)    3 month supply of vyvanse 30 mg daily has been written. Will try a short acting 10 mg adderall in the afternoon to see if this will help.       Relevant Medications   lisdexamfetamine (VYVANSE) 30 MG capsule   lisdexamfetamine (VYVANSE) 30 MG capsule (Start on 07/18/2022)   lisdexamfetamine (VYVANSE) 30 MG capsule (Start on 08/18/2022)   amphetamine-dextroamphetamine (ADDERALL) 10 MG tablet   Other Visit Diagnoses     Vitamin D deficiency       Relevant Orders   Patient has not had this checked in some time to ensure resolution, will check new vitamin D level  Vitamin D, 25-hydroxy (Completed)   Depression, recurrent ([facility])       Relevant Medications   traZODone (DESYREL) 50 MG tablet   escitalopram (LEXAPRO) 10 MG tablet   buPROPion (WELLBUTRIN XL) 150 MG 24 hr tablet       Return in about 3 months (around 09/22/2022) for video visit for medication refills.    Farrel Conners, MD

## 2022-06-24 NOTE — Assessment & Plan Note (Signed)
3 month supply of vyvanse 30 mg daily has been written. Will try a short acting 10 mg adderall in the afternoon to see if this will help.

## 2022-06-27 ENCOUNTER — Other Ambulatory Visit (HOSPITAL_BASED_OUTPATIENT_CLINIC_OR_DEPARTMENT_OTHER): Payer: Self-pay

## 2022-06-27 MED ORDER — FLUCONAZOLE 150 MG PO TABS
ORAL_TABLET | ORAL | 0 refills | Status: DC
  Filled 2022-06-27: qty 3, 3d supply, fill #0

## 2022-07-01 ENCOUNTER — Encounter: Payer: Self-pay | Admitting: Family Medicine

## 2022-07-01 ENCOUNTER — Other Ambulatory Visit (HOSPITAL_BASED_OUTPATIENT_CLINIC_OR_DEPARTMENT_OTHER): Payer: Self-pay

## 2022-07-01 DIAGNOSIS — F5081 Binge eating disorder: Secondary | ICD-10-CM

## 2022-07-01 MED ORDER — SULFAMETHOXAZOLE-TRIMETHOPRIM 800-160 MG PO TABS
1.0000 | ORAL_TABLET | Freq: Two times a day (BID) | ORAL | 0 refills | Status: AC
  Filled 2022-07-01: qty 6, 3d supply, fill #0

## 2022-07-01 MED ORDER — LISDEXAMFETAMINE DIMESYLATE 30 MG PO CAPS: 30.0000 mg | ORAL_CAPSULE | Freq: Every day | ORAL | 0 refills | Status: DC

## 2022-07-21 ENCOUNTER — Other Ambulatory Visit: Payer: Self-pay | Admitting: Family Medicine

## 2022-07-21 DIAGNOSIS — F5081 Binge eating disorder: Secondary | ICD-10-CM

## 2022-07-22 ENCOUNTER — Other Ambulatory Visit (HOSPITAL_BASED_OUTPATIENT_CLINIC_OR_DEPARTMENT_OTHER): Payer: Self-pay

## 2022-07-22 ENCOUNTER — Other Ambulatory Visit: Payer: Self-pay

## 2022-07-22 MED ORDER — AMPHETAMINE-DEXTROAMPHETAMINE 10 MG PO TABS
10.0000 mg | ORAL_TABLET | Freq: Every day | ORAL | 0 refills | Status: DC
  Filled 2022-07-22: qty 25, 25d supply, fill #0
  Filled 2022-07-22: qty 5, 5d supply, fill #0

## 2022-07-29 ENCOUNTER — Other Ambulatory Visit: Payer: Self-pay

## 2022-07-29 ENCOUNTER — Other Ambulatory Visit (HOSPITAL_BASED_OUTPATIENT_CLINIC_OR_DEPARTMENT_OTHER): Payer: Self-pay

## 2022-07-30 ENCOUNTER — Other Ambulatory Visit: Payer: Self-pay

## 2022-08-04 ENCOUNTER — Telehealth: Payer: Self-pay | Admitting: Family Medicine

## 2022-08-04 ENCOUNTER — Other Ambulatory Visit (HOSPITAL_BASED_OUTPATIENT_CLINIC_OR_DEPARTMENT_OTHER): Payer: Self-pay

## 2022-08-04 DIAGNOSIS — F5081 Binge eating disorder: Secondary | ICD-10-CM

## 2022-08-04 NOTE — Telephone Encounter (Signed)
Spoke with Magda Paganini at the [facility] and she stated the patient has an active Rx on file.  She stated the Rx has been on back order for months, they are currently out of the medication at this time and she is not sure about the sigs as this would only come from the prescriber.  Message sent to PCP.

## 2022-08-04 NOTE — Telephone Encounter (Signed)
She should already have an rx for a 30 day supply at the pharmacy-- is there a reason it's only for 7 days? Could you call the pharmacy to find out?

## 2022-08-05 ENCOUNTER — Other Ambulatory Visit (HOSPITAL_BASED_OUTPATIENT_CLINIC_OR_DEPARTMENT_OTHER): Payer: Self-pay

## 2022-08-05 ENCOUNTER — Encounter: Payer: Self-pay | Admitting: Family Medicine

## 2022-08-05 ENCOUNTER — Other Ambulatory Visit: Payer: Self-pay | Admitting: Family Medicine

## 2022-08-05 DIAGNOSIS — F5081 Binge eating disorder: Secondary | ICD-10-CM

## 2022-08-05 MED ORDER — LISDEXAMFETAMINE DIMESYLATE 20 MG PO CAPS
20.0000 mg | ORAL_CAPSULE | Freq: Every day | ORAL | 0 refills | Status: DC
  Filled 2022-08-05: qty 30, 30d supply, fill #0

## 2022-08-05 NOTE — Telephone Encounter (Signed)
Ok please call pt and let her know that the medication is on back order-- would she like me to call in something else?

## 2022-08-05 NOTE — Telephone Encounter (Signed)
Left a detailed message with the information below at the patient's cell number. ?

## 2022-08-05 NOTE — Telephone Encounter (Signed)
Pt is calling to would like to go back to vyanase 40 mg or 20 mg

## 2022-08-05 NOTE — Telephone Encounter (Signed)
Ok so I called the pharmacy- they can get her the 20 mg capsules of vyvanse so I will call this in for her for this month, then she can try for the 30 mg capsules when she is due next month.

## 2022-08-05 NOTE — Telephone Encounter (Signed)
Mychart message sent with information as below.

## 2022-08-11 ENCOUNTER — Other Ambulatory Visit: Payer: Self-pay

## 2022-08-21 ENCOUNTER — Ambulatory Visit (INDEPENDENT_AMBULATORY_CARE_PROVIDER_SITE_OTHER): Payer: 59 | Admitting: Orthopaedic Surgery

## 2022-08-21 ENCOUNTER — Encounter: Payer: Self-pay | Admitting: Orthopaedic Surgery

## 2022-08-21 VITALS — Ht 62.0 in | Wt 165.0 lb

## 2022-08-21 DIAGNOSIS — M7541 Impingement syndrome of right shoulder: Secondary | ICD-10-CM | POA: Diagnosis not present

## 2022-08-21 DIAGNOSIS — M5020 Other cervical disc displacement, unspecified cervical region: Secondary | ICD-10-CM

## 2022-08-21 DIAGNOSIS — M542 Cervicalgia: Secondary | ICD-10-CM

## 2022-08-21 MED ORDER — LIDOCAINE HCL 1 % IJ SOLN
0.5000 mL | INTRAMUSCULAR | Status: AC | PRN
  Administered 2022-08-21: .5 mL

## 2022-08-21 MED ORDER — BUPIVACAINE HCL 0.25 % IJ SOLN
4.0000 mL | INTRAMUSCULAR | Status: AC | PRN
  Administered 2022-08-21: 4 mL via INTRA_ARTICULAR

## 2022-08-21 MED ORDER — METHYLPREDNISOLONE ACETATE 40 MG/ML IJ SUSP
40.0000 mg | INTRAMUSCULAR | Status: AC | PRN
  Administered 2022-08-21: 40 mg via INTRA_ARTICULAR

## 2022-08-21 NOTE — Progress Notes (Signed)
Office Visit Note   Patient: Sonia [name]           Date of Birth: 1985-10-17           MRN: [id number] Visit Date: 08/21/2022              Requested by: Farrel Conners, [place],  [place] [address] PCP: Farrel Conners, MD   Assessment & Plan: Visit Diagnoses:  1. Cervical pain (neck)   2. Protrusion of cervical intervertebral disc   3. Impingement syndrome of right shoulder     Plan: Patient went through right muscle relaxants acupuncture, dry needling.  Pain is severe.  Her cousins a physical therapist also worked with her.  She needs a cervical MRI scan to evaluate her for cervical HNP right side with radiculopathy.  Subacromial injection performed.  Work slip given no work x 1 week.  Office follow-up after MRI scan.  Follow-Up Instructions: No follow-ups on file.   Orders:  Orders Placed This Encounter  Procedures   MR Cervical Spine w/o contrast   No orders of the defined types were placed in this encounter.     Procedures: Large Joint Inj: R subacromial bursa on 08/21/2022 11:59 AM Indications: pain Details: 22 G 1.5 in needle  Arthrogram: No  Medications: 4 mL bupivacaine 0.25 %; 40 mg methylPREDNISolone acetate 40 MG/ML; 0.5 mL lidocaine 1 % Outcome: tolerated well, no immediate complications Procedure, treatment alternatives, risks and benefits explained, specific risks discussed. Consent was given by the patient. Immediately prior to procedure a time out was called to verify the correct patient, procedure, equipment, support staff and site/side marked as required. Patient was prepped and draped in the usual sterile fashion.       Clinical Data: No additional findings.   Subjective: Chief Complaint  Patient presents with   Neck - Pain   Right Arm - Pain    HPI 36 year old female onset of neck pain 2 to 3 months ago which she states for the last 5 to 6 days has been severe rated at 9 out of 10.  Originally  she had some tightness in this and numbness in her hands but this is progressed and now she is having numbness down to her right fingers.  She has been through acupuncture also dry needling treated with a steroid Dosepak which gave her was trace improvement for stairs so but otherwise not effective.  She has had muscle relaxants anti-inflammatories.  Patient is a Radio broadcast assistant works in [place] and states she is not able to work due to the severe pain.  She describes the pain as sharp stabbing electrical.  She has 10% neck flexion extension with sharp pain and spasms in her neck.  No left upper extremity symptoms no long track signs no gait disturbance.  After she had a massage she noticed some increased discomfort in the right arm right shoulder and some pain with abduction and internal rotation.  Review of Systems patient that the past history of some eating disorder is now stable.  History of pancreatitis.  History of Crohn's disease.   Objective: Vital Signs: Ht 5\' 2"  (1.575 m)   Wt 165 lb (74.8 kg)   BMI 30.18 kg/m   Physical Exam Constitutional:      Appearance: She is well-developed.     Comments: Is in obvious pain malingering neck rigid.  HENT:     Head: Normocephalic.     Right Ear: External ear  normal.     Left Ear: External ear normal. There is no impacted cerumen.  Eyes:     Pupils: Pupils are equal, round, and reactive to light.  Neck:     Thyroid: No thyromegaly.     Trachea: No tracheal deviation.  Cardiovascular:     Rate and Rhythm: Normal rate.  Pulmonary:     Effort: Pulmonary effort is normal.  Abdominal:     Palpations: Abdomen is soft.  Musculoskeletal:     Cervical back: No rigidity.  Skin:    General: Skin is warm and dry.  Neurological:     Mental Status: She is alert and oriented to person, place, and time.  Psychiatric:        Behavior: Behavior normal.     Ortho Exam right brachial plexus tenderness.  Upper extremity reflexes are 1+ and symmetrical  positive impingement right shoulder.  Negative drop arm test.  External/internal rotation of shoulder is normal.  She has good biceps triceps strength.  Decreased sensation index thumb C6 distribution.  No lower extremity clonus normal heel-toe gait normal lower extremity strength.  She has only 10% neck flexion with severe pain extends it extension gives her neck spasms.  Rotation is limited to 25% right and left.  Positive Spurling on the right negative on the left.  Negative Lhermitte with severe neck pain.  Specialty Comments:  No specialty comments available.  Imaging: No results found.   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/2018   Crohn's disease of colon without complication [id number]   LUQ abdominal pain 03/01/2018   Crohn's disease 08/30/2014   Headache disorder 08/30/2014   Anxiety 06/18/2009   GERD 05/23/2009   Past Medical History:  Diagnosis Date   Allergy    Anemia    Anxiety    Asthma    Blood transfusion without reported diagnosis    iron infusion   COVID-19    Crohn's colitis    Depression    GERD (gastroesophageal reflux disease)    no meds   Normal labor 03/27/2013   Postpartum care following cesarean delivery (11/9) 03/27/2013    Family History  Problem Relation Age of Onset   COPD Mother    Heart disease Mother        smoker   Hernia Mother    Diverticulitis Mother     Kidney disease Mother    Heart failure Mother    Stroke Mother 78   Osteoporosis Mother    Anxiety disorder Mother        ? on xanax   Rheum arthritis Mother    Heart disease Maternal Grandmother        smoker   Arthritis-Osteo Maternal Grandmother    Cancer Maternal Grandfather        throat   Alcohol abuse Maternal Grandfather    Other Father        pituitary gland tumor   Depression Sister  Anxiety disorder Sister    Crohn's disease Cousin    Colon cancer Neg Hx    Rectal cancer Neg Hx    Stomach cancer Neg Hx    Esophageal cancer Neg Hx     Past Surgical History:  Procedure Laterality Date   BIOPSY  05/24/2020   Procedure: BIOPSY;  Surgeon: Jerene Bears, MD;  Location: [facility] ENDOSCOPY;  Service: Gastroenterology;;   CESAREAN SECTION N/A 03/27/2013   Procedure: CESAREAN SECTION;  Surgeon: Lovenia Kim, MD;  Location: [facility];  Service: Obstetrics;  Laterality: N/A;   CESAREAN SECTION N/A 09/04/2014   Procedure: REPEAT CESAREAN SECTION;  Surgeon: Crawford Givens, MD;  Location: [facility];  Service: Obstetrics;  Laterality: N/A;   ESOPHAGOGASTRODUODENOSCOPY (EGD) WITH PROPOFOL N/A 05/24/2020   Procedure: ESOPHAGOGASTRODUODENOSCOPY (EGD) WITH PROPOFOL;  Surgeon: Jerene Bears, MD;  Location: [facility] ENDOSCOPY;  Service: Gastroenterology;  Laterality: N/A;   HAND SURGERY  2012   with nerve block   LAPAROSCOPIC ENDOMETRIOSIS FULGURATION     WISDOM TOOTH EXTRACTION     Social History   Occupational History   Occupation: stay at home mother  Tobacco Use   Smoking status: Never   Smokeless tobacco: Former    Types: Nurse, children's Use: Never used  Substance and Sexual Activity   Alcohol use: Yes    Comment: occasionally   Drug use: No   Sexual activity: Yes    Birth control/protection: None

## 2022-08-25 ENCOUNTER — Encounter (HOSPITAL_COMMUNITY): Payer: Self-pay | Admitting: Hematology

## 2022-08-26 ENCOUNTER — Other Ambulatory Visit (HOSPITAL_BASED_OUTPATIENT_CLINIC_OR_DEPARTMENT_OTHER): Payer: Self-pay

## 2022-08-26 ENCOUNTER — Other Ambulatory Visit: Payer: Self-pay

## 2022-08-26 ENCOUNTER — Other Ambulatory Visit: Payer: Self-pay | Admitting: Family Medicine

## 2022-08-26 DIAGNOSIS — F5081 Binge eating disorder: Secondary | ICD-10-CM

## 2022-08-26 MED ORDER — AMPHETAMINE-DEXTROAMPHETAMINE 10 MG PO TABS
10.0000 mg | ORAL_TABLET | Freq: Every day | ORAL | 0 refills | Status: DC
Start: 2022-08-26 — End: 2022-09-26
  Filled 2022-08-26: qty 30, 30d supply, fill #0

## 2022-08-27 ENCOUNTER — Encounter: Payer: Self-pay | Admitting: Orthopaedic Surgery

## 2022-08-27 ENCOUNTER — Ambulatory Visit (INDEPENDENT_AMBULATORY_CARE_PROVIDER_SITE_OTHER): Payer: BC Managed Care – PPO | Admitting: Orthopaedic Surgery

## 2022-08-27 VITALS — Ht 62.0 in | Wt 165.0 lb

## 2022-08-27 DIAGNOSIS — M4802 Spinal stenosis, cervical region: Secondary | ICD-10-CM

## 2022-08-27 DIAGNOSIS — M5020 Other cervical disc displacement, unspecified cervical region: Secondary | ICD-10-CM

## 2022-08-27 MED ORDER — HYDROCODONE-ACETAMINOPHEN 5-325 MG PO TABS: 1.0000 | ORAL_TABLET | Freq: Four times a day (QID) | ORAL | 0 refills | Status: DC | PRN

## 2022-08-27 NOTE — Progress Notes (Unsigned)
Office Visit Note   Patient: Sonia [name]           Date of Birth: [birth year]           MRN: [id number] Visit Date: 08/27/2022              Requested by: Karie Georges, MD [address] [place],  [place] [address] PCP: Karie Georges, MD   Assessment & Plan: Visit Diagnoses: No diagnosis found.  Plan: ***  Follow-Up Instructions: No follow-ups on file.   Orders:  No orders of the defined types were placed in this encounter.  Meds ordered this encounter  Medications   HYDROcodone-acetaminophen (NORCO/VICODIN) 5-325 MG tablet    Sig: Take 1-2 tablets by mouth every 6 (six) hours as needed for moderate pain.    Dispense:  30 tablet    Refill:  0      Procedures: No procedures performed   Clinical Data: No additional findings.   Subjective: Chief Complaint  Patient presents with   Neck - Follow-up, Pain    MRI review    HPI  Review of Systems   Objective: Vital Signs: Ht 5\' 2"  (1.575 m)   Wt 165 lb (74.8 kg)   BMI 30.18 kg/m   Physical Exam  Ortho Exam  Specialty Comments:  No specialty comments available.  Imaging: No results found.   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/2018   Crohn's disease of colon without complication 03/01/2018   LUQ abdominal pain 03/01/2018   Crohn's disease 08/30/2014   Headache disorder 08/30/2014    Anxiety 06/18/2009   GERD 05/23/2009   Past Medical History:  Diagnosis Date   Allergy    Anemia    Anxiety    Asthma    Blood transfusion without reported diagnosis    iron infusion   COVID-19    Crohn's colitis    Depression    GERD (gastroesophageal reflux disease)    no meds   Normal labor 03/27/2013   Postpartum care following cesarean delivery (11/9) 03/27/2013    Family History  Problem Relation Age of Onset   COPD Mother    Heart disease Mother        smoker   Hernia Mother    Diverticulitis Mother    Kidney disease Mother    Heart failure Mother    Stroke Mother 59   Osteoporosis Mother    Anxiety disorder Mother        ? on xanax   Rheum arthritis Mother    Heart disease Maternal Grandmother        smoker   Arthritis-Osteo Maternal Grandmother    Cancer Maternal Grandfather        throat   Alcohol abuse Maternal Grandfather    Other Father        pituitary gland tumor   Depression Sister    Anxiety disorder Sister    Crohn's disease  Cousin    Colon cancer Neg Hx    Rectal cancer Neg Hx    Stomach cancer Neg Hx    Esophageal cancer Neg Hx     Past Surgical History:  Procedure Laterality Date   BIOPSY  05/24/2020   Procedure: BIOPSY;  Surgeon: Beverley Fiedler, MD;  Location: [facility] ENDOSCOPY;  Service: Gastroenterology;;   CESAREAN SECTION N/A 03/27/2013   Procedure: CESAREAN SECTION;  Surgeon: Lenoard Aden, MD;  Location: [facility];  Service: Obstetrics;  Laterality: N/A;   CESAREAN SECTION N/A 09/04/2014   Procedure: REPEAT CESAREAN SECTION;  Surgeon: Jaymes Graff, MD;  Location: [facility] ORS;  Service: Obstetrics;  Laterality: N/A;   ESOPHAGOGASTRODUODENOSCOPY (EGD) WITH PROPOFOL N/A 05/24/2020   Procedure: ESOPHAGOGASTRODUODENOSCOPY (EGD) WITH PROPOFOL;  Surgeon: Beverley Fiedler, MD;  Location: [facility] ENDOSCOPY;  Service: Gastroenterology;  Laterality: N/A;   HAND SURGERY  2012   with nerve block   LAPAROSCOPIC ENDOMETRIOSIS FULGURATION     WISDOM TOOTH EXTRACTION      Social History   Occupational History   Occupation: stay at home mother  Tobacco Use   Smoking status: Never   Smokeless tobacco: Former    Types: Associate Professor Use: Never used  Substance and Sexual Activity   Alcohol use: Yes    Comment: occasionally   Drug use: No   Sexual activity: Yes    Birth control/protection: None

## 2022-08-28 DIAGNOSIS — M4802 Spinal stenosis, cervical region: Secondary | ICD-10-CM | POA: Insufficient documentation

## 2022-09-01 ENCOUNTER — Encounter (HOSPITAL_COMMUNITY): Payer: Self-pay | Admitting: Hematology

## 2022-09-03 ENCOUNTER — Other Ambulatory Visit (HOSPITAL_BASED_OUTPATIENT_CLINIC_OR_DEPARTMENT_OTHER): Payer: Self-pay

## 2022-09-03 MED ORDER — METHYLPREDNISOLONE 4 MG PO TBPK
ORAL_TABLET | ORAL | 0 refills | Status: DC
  Filled 2022-09-03: qty 21, 6d supply, fill #0

## 2022-09-04 ENCOUNTER — Other Ambulatory Visit (HOSPITAL_BASED_OUTPATIENT_CLINIC_OR_DEPARTMENT_OTHER): Payer: Self-pay

## 2022-09-04 MED ORDER — GABAPENTIN 300 MG PO CAPS
300.0000 mg | ORAL_CAPSULE | Freq: Two times a day (BID) | ORAL | 1 refills | Status: DC
  Filled 2022-09-04: qty 45, 23d supply, fill #0

## 2022-09-05 ENCOUNTER — Other Ambulatory Visit (HOSPITAL_BASED_OUTPATIENT_CLINIC_OR_DEPARTMENT_OTHER): Payer: Self-pay

## 2022-09-05 ENCOUNTER — Other Ambulatory Visit: Payer: Self-pay | Admitting: Family Medicine

## 2022-09-05 ENCOUNTER — Encounter (HOSPITAL_BASED_OUTPATIENT_CLINIC_OR_DEPARTMENT_OTHER): Payer: Self-pay | Admitting: Pharmacist

## 2022-09-05 DIAGNOSIS — F5081 Binge eating disorder: Secondary | ICD-10-CM

## 2022-09-05 MED ORDER — LISDEXAMFETAMINE DIMESYLATE 20 MG PO CAPS
20.0000 mg | ORAL_CAPSULE | Freq: Every day | ORAL | 0 refills | Status: DC
Start: 2022-09-05 — End: 2022-09-17
  Filled 2022-09-05: qty 30, 30d supply, fill #0

## 2022-09-10 ENCOUNTER — Telehealth: Payer: Self-pay

## 2022-09-10 NOTE — Telephone Encounter (Signed)
noted 

## 2022-09-10 NOTE — Telephone Encounter (Signed)
FYI - patient went to see neurosurgeon and decided to have that doctor do the cervical spine surgery.

## 2022-09-15 ENCOUNTER — Other Ambulatory Visit: Payer: Self-pay | Admitting: Family Medicine

## 2022-09-15 DIAGNOSIS — F5081 Binge eating disorder: Secondary | ICD-10-CM

## 2022-09-17 ENCOUNTER — Other Ambulatory Visit (HOSPITAL_BASED_OUTPATIENT_CLINIC_OR_DEPARTMENT_OTHER): Payer: Self-pay

## 2022-09-17 MED ORDER — LISDEXAMFETAMINE DIMESYLATE 30 MG PO CAPS
30.0000 mg | ORAL_CAPSULE | Freq: Every day | ORAL | 0 refills | Status: DC
Start: 2022-09-17 — End: 2022-10-19
  Filled 2022-09-17: qty 30, 30d supply, fill #0

## 2022-09-18 ENCOUNTER — Other Ambulatory Visit (HOSPITAL_BASED_OUTPATIENT_CLINIC_OR_DEPARTMENT_OTHER): Payer: Self-pay

## 2022-09-19 ENCOUNTER — Ambulatory Visit: Payer: 59 | Admitting: Physician Assistant

## 2022-09-19 ENCOUNTER — Other Ambulatory Visit (HOSPITAL_BASED_OUTPATIENT_CLINIC_OR_DEPARTMENT_OTHER): Payer: Self-pay

## 2022-09-19 MED ORDER — HYDROCODONE-ACETAMINOPHEN 5-325 MG PO TABS
1.0000 | ORAL_TABLET | Freq: Four times a day (QID) | ORAL | 0 refills | Status: DC | PRN
  Filled 2022-09-19: qty 30, 7d supply, fill #0

## 2022-09-19 MED ORDER — TIZANIDINE HCL 2 MG PO TABS
2.0000 mg | ORAL_TABLET | Freq: Four times a day (QID) | ORAL | 0 refills | Status: DC | PRN
  Filled 2022-09-19: qty 45, 15d supply, fill #0

## 2022-09-26 ENCOUNTER — Other Ambulatory Visit: Payer: Self-pay

## 2022-09-26 ENCOUNTER — Encounter: Payer: Self-pay | Admitting: Emergency Medicine

## 2022-09-26 ENCOUNTER — Other Ambulatory Visit (HOSPITAL_BASED_OUTPATIENT_CLINIC_OR_DEPARTMENT_OTHER): Payer: Self-pay

## 2022-09-26 ENCOUNTER — Other Ambulatory Visit: Payer: Self-pay | Admitting: Family Medicine

## 2022-09-26 ENCOUNTER — Ambulatory Visit
Admission: EM | Admit: 2022-09-26 | Discharge: 2022-09-26 | Disposition: A | Discharge status: Home / Self Care | Payer: 59 | Attending: Physician Assistant | Admitting: Physician Assistant

## 2022-09-26 DIAGNOSIS — R300 Dysuria: Secondary | ICD-10-CM | POA: Diagnosis not present

## 2022-09-26 DIAGNOSIS — F5081 Binge eating disorder: Secondary | ICD-10-CM

## 2022-09-26 LAB — POCT URINALYSIS DIP (MANUAL ENTRY)
Bilirubin, UA: NEGATIVE
Blood, UA: NEGATIVE
Glucose, UA: NEGATIVE mg/dL
Ketones, POC UA: NEGATIVE mg/dL
Nitrite, UA: NEGATIVE
Protein Ur, POC: NEGATIVE mg/dL
Spec Grav, UA: 1.015 (ref 1.010–1.025)
Urobilinogen, UA: 0.2 E.U./dL
pH, UA: 7 (ref 5.0–8.0)

## 2022-09-26 MED ORDER — AMPHETAMINE-DEXTROAMPHETAMINE 10 MG PO TABS
10.0000 mg | ORAL_TABLET | Freq: Every day | ORAL | 0 refills | Status: DC
Start: 2022-09-26 — End: 2022-10-27
  Filled 2022-09-26: qty 30, 30d supply, fill #0

## 2022-09-26 MED ORDER — NITROFURANTOIN MONOHYD MACRO 100 MG PO CAPS: 100.0000 mg | ORAL_CAPSULE | Freq: Two times a day (BID) | ORAL | 0 refills | Status: DC

## 2022-09-26 NOTE — ED Triage Notes (Signed)
Pt reports urinary frequency, dysuria for last several days. Pt reports recently had neck surgery and was taking a lot of pain medication and reports did a colon detox and reports symptoms started shortly after and unsure if related.

## 2022-09-26 NOTE — ED Provider Notes (Signed)
[facility]    CSN: [id number] Arrival date & time: 09/26/22  1213      History   Chief Complaint Chief Complaint  Patient presents with   Urinary Frequency    HPI Sonia [name] is a 37 y.o. female who presents with dysuria and frequency x  1 days. Her last UTI was 06/2022. Denies abnormal discharge, fever or flank pain. Pt states she had cervical disc surgery in the past week and had been taking a lot of pain meds and got very constipated, so 5 days ago did a colon cleanse and had diarrhea accident while out with a friend and believes this triggered it. She does no know if she was cathe'd when she had the surgery. She has had UTI's and feels this is early symptoms for her.     Past Medical History:  Diagnosis Date   Allergy    Anemia    Anxiety    Asthma    Blood transfusion without reported diagnosis    iron infusion   COVID-19    Crohn's colitis (HCC)    Depression    GERD (gastroesophageal reflux disease)    no meds   Normal labor 03/27/2013   Postpartum care following cesarean delivery (11/9) 03/27/2013    Patient Active Problem List   Diagnosis Date Noted   Spinal stenosis of cervical region 08/28/2022   Cervical pain (neck) 08/21/2022   Protrusion of cervical intervertebral disc 08/21/2022   Impingement syndrome of right shoulder 08/21/2022   Binge eating disorder 01/24/2022   Palpitations 11/26/2021   Abdominal pain, epigastric    Hypoglycemia without diagnosis of diabetes mellitus 05/20/2020   Hemochromatosis 05/20/2020   Acute pancreatitis 05/19/2020   Iron deficiency anemia due to chronic blood loss 02/14/2020   Moderate persistent asthma 09/06/2019   Insomnia 09/29/2018   Bulimia nervosa, purging type 09/15/2018   Moderate major depression (HCC) 09/15/2018   Crohn's disease of colon without complication (HCC) 03/01/2018   LUQ abdominal pain 03/01/2018   Crohn's disease (HCC) 08/30/2014   Headache disorder 08/30/2014    Anxiety 06/18/2009   GERD 05/23/2009    Past Surgical History:  Procedure Laterality Date   BIOPSY  05/24/2020   Procedure: BIOPSY;  Surgeon: Beverley Fiedler, MD;  Location: [facility] ENDOSCOPY;  Service: Gastroenterology;;   CERVICAL SPINE SURGERY     CESAREAN SECTION N/A 03/27/2013   Procedure: CESAREAN SECTION;  Surgeon: Lenoard Aden, MD;  Location: [facility];  Service: Obstetrics;  Laterality: N/A;   CESAREAN SECTION N/A 09/04/2014   Procedure: REPEAT CESAREAN SECTION;  Surgeon: Jaymes Graff, MD;  Location: [facility];  Service: Obstetrics;  Laterality: N/A;   ESOPHAGOGASTRODUODENOSCOPY (EGD) WITH PROPOFOL N/A 05/24/2020   Procedure: ESOPHAGOGASTRODUODENOSCOPY (EGD) WITH PROPOFOL;  Surgeon: Beverley Fiedler, MD;  Location: [facility] ENDOSCOPY;  Service: Gastroenterology;  Laterality: N/A;   HAND SURGERY  2012   with nerve block   LAPAROSCOPIC ENDOMETRIOSIS FULGURATION     WISDOM TOOTH EXTRACTION      OB History     Gravida  2   Para  2   Term  2   Preterm  0   AB  0   Living  2      SAB  0   IAB  0   Ectopic  0   Multiple  0   Live Births  2            Home Medications    Prior to Admission medications  Medication Sig Start Date End Date Taking? Authorizing Provider  nitrofurantoin, macrocrystal-monohydrate, (MACROBID) 100 MG capsule Take 1 capsule (100 mg total) by mouth 2 (two) times daily. 09/26/22  Yes Rodriguez-Southworth, Nettie Elm, PA-C  albuterol (VENTOLIN HFA) 108 (90 Base) MCG/ACT inhaler Inhale 1-2 puffs into the lungs every 4 (four) hours as needed for wheezing or shortness of breath. Patient taking differently: Inhale 1-2 puffs into the lungs every 4 (four) hours as needed for wheezing or shortness of breath. As needed 08/26/19   Wallis Bamberg, PA-C  amphetamine-dextroamphetamine (ADDERALL) 10 MG tablet Take 1 tablet (10 mg total) by mouth daily after lunch. 09/26/22   Karie Georges, MD  budesonide-formoterol [facility]) 80-4.5 MCG/ACT inhaler Inhale 2 puffs into  the lungs 2 (two) times a day. As needed    [provider]  buPROPion (WELLBUTRIN XL) 150 MG 24 hr tablet Take 1 tablet (150 mg total) by mouth daily. 06/24/22   Karie Georges, MD  cetirizine (ZYRTEC) 10 MG tablet Take 10 mg by mouth daily. Patient not taking: Reported on 08/21/2022    [provider]  dicyclomine (BENTYL) 20 MG tablet Take 1 tablet (20 mg total) by mouth 3 (three) times daily. Patient taking differently: Take 20 mg by mouth as needed. 05/24/20   Swayze, Ava, DO  escitalopram (LEXAPRO) 10 MG tablet Take 1 tablet (10 mg total) by mouth daily. 06/24/22   Karie Georges, MD  fluconazole (DIFLUCAN) 150 MG tablet Take one tablet by mouth daily on days 1, 4, 7 as needed Patient not taking: Reported on 08/21/2022 06/27/22     gabapentin (NEURONTIN) 300 MG capsule Take 1 capsule (300 mg total) by mouth 2 (two) times daily. 09/04/22     ibuprofen (ADVIL) 800 MG tablet Take 800 mg by mouth 2 (two) times daily.    [provider]  lisdexamfetamine (VYVANSE) 30 MG capsule Take 1 capsule (30 mg total) by mouth daily. 09/17/22   Karie Georges, MD  traZODone (DESYREL) 50 MG tablet Take 1 tablet (50 mg total) by mouth at bedtime. 06/24/22   Karie Georges, MD  Norgestimate-Eth Estradiol (SPRINTEC 28 PO) Take by mouth.  09/14/19  [provider]    Family History Family History  Problem Relation Age of Onset   COPD Mother    Heart disease Mother        smoker   Hernia Mother    Diverticulitis Mother    Kidney disease Mother    Heart failure Mother    Stroke Mother 58   Osteoporosis Mother    Anxiety disorder Mother        ? on xanax   Rheum arthritis Mother    Heart disease Maternal Grandmother        smoker   Arthritis-Osteo Maternal Grandmother    Cancer Maternal Grandfather        throat   Alcohol abuse Maternal Grandfather    Other Father        pituitary gland tumor   Depression Sister    Anxiety disorder Sister    Crohn's disease  Cousin    Colon cancer Neg Hx    Rectal cancer Neg Hx    Stomach cancer Neg Hx    Esophageal cancer Neg Hx     Social History Social History   Tobacco Use   Smoking status: Never   Smokeless tobacco: Former    Types: Associate Professor Use: Never used  Substance Use Topics  Alcohol use: Yes    Comment: occasionally   Drug use: No     Allergies   Other   Review of Systems Review of Systems Is recovering from cervical disc surgery. Otherwise as noted in HPI  Physical Exam Triage Vital Signs ED Triage Vitals  Enc Vitals Group     BP 09/26/22 1317 126/87     Pulse Rate 09/26/22 1317 70     Resp 09/26/22 1317 17     Temp 09/26/22 1317 98.4 F (36.9 C)     Temp Source 09/26/22 1317 Oral     SpO2 09/26/22 1317 97 %     Weight --      Height --      Head Circumference --      Peak Flow --      Pain Score 09/26/22 1321 2     Pain Loc --      Pain Edu? --      Excl. in GC? --    No data found.  Updated Vital Signs BP 126/87 (BP Location: Right Arm)   Pulse 70   Temp 98.4 F (36.9 C) (Oral)   Resp 17   LMP 09/08/2022 (Approximate)   SpO2 97%   Visual Acuity Right Eye Distance:   Left Eye Distance:   Bilateral Distance:    Right Eye Near:   Left Eye Near:    Bilateral Near:      Physical Exam Vitals and nursing note reviewed.  Constitutional:      General: She is not in acute distress.    Appearance: She is not toxic-appearing.  HENT:     Head: Normocephalic.     Right Ear: External ear normal.     Left Ear: External ear normal.  Eyes:     General: No scleral icterus.    Conjunctiva/sclera: Conjunctivae normal.  Pulmonary:     Effort: Pulmonary effort is normal.  Abdominal:     General: Bowel sounds are normal.     Palpations: Abdomen is soft. There is no mass.     Tenderness: There is no guarding or rebound.     Comments: - CVA tenderness   Musculoskeletal:        General: Normal range of motion.     Cervical back: Neck supple.      Comments: BACK- no muscular tenderness on area of complaint with palpation  Skin:    General: Skin is warm and dry.     Findings: No rash. Has well healing anterior neck incision which is covered with steri strips, but there is no redness or warmth noted  Neurological:     Mental Status: She is alert and oriented to person, place, and time.     Gait: Gait normal.  Psychiatric:        Mood and Affect: Mood normal.        Behavior: Behavior normal.        Thought Content: Thought content normal.        Judgment: Judgment normal.    UC Treatments / Results  Labs (all labs ordered are listed, but only abnormal results are displayed) Labs Reviewed  POCT URINALYSIS DIP (MANUAL ENTRY) - Abnormal; Notable for the following components:      Result Value   Leukocytes, UA Trace (*)    All other components within normal limits  URINE CULTURE    EKG   Radiology No results found.  Procedures Procedures (including critical care time)  Medications Ordered  in UC Medications - No data to display  Initial Impression / Assessment and Plan / UC Course  I have reviewed the triage vital signs and the nursing notes.  Pertinent labs  results that were available during my care of the patient were reviewed by me and considered in my medical decision making (see chart for details).  UTI  I ordered a urine culture and in the mean time placed her on Macrobid as noted. We will call her if we need to make any changes on her meds once the culture comes back.    Final Clinical Impressions(s) / UC Diagnoses   Final diagnoses:  Dysuria   Discharge Instructions   None    ED Prescriptions     Medication Sig Dispense Auth. Provider   nitrofurantoin, macrocrystal-monohydrate, (MACROBID) 100 MG capsule Take 1 capsule (100 mg total) by mouth 2 (two) times daily. 10 capsule Rodriguez-Southworth, Nettie Elm, PA-C      I have reviewed the PDMP during this encounter.   Garey Ham, PA-C 09/26/22 1416

## 2022-09-27 LAB — URINE CULTURE
Culture: NO GROWTH
Special Requests: NORMAL

## 2022-10-02 ENCOUNTER — Other Ambulatory Visit (HOSPITAL_BASED_OUTPATIENT_CLINIC_OR_DEPARTMENT_OTHER): Payer: Self-pay

## 2022-10-02 MED ORDER — METHYLPREDNISOLONE 4 MG PO TBPK
ORAL_TABLET | ORAL | 0 refills | Status: DC
  Filled 2022-10-02: qty 21, 6d supply, fill #0

## 2022-10-03 ENCOUNTER — Ambulatory Visit: Admit: 2022-10-03 | Payer: 59 | Admitting: Orthopaedic Surgery

## 2022-10-03 SURGERY — ANTERIOR CERVICAL DECOMPRESSION/DISCECTOMY FUSION 2 LEVELS
Anesthesia: General

## 2022-10-19 ENCOUNTER — Other Ambulatory Visit: Payer: Self-pay | Admitting: Family Medicine

## 2022-10-19 DIAGNOSIS — F5081 Binge eating disorder: Secondary | ICD-10-CM

## 2022-10-20 ENCOUNTER — Other Ambulatory Visit (HOSPITAL_BASED_OUTPATIENT_CLINIC_OR_DEPARTMENT_OTHER): Payer: Self-pay

## 2022-10-20 ENCOUNTER — Other Ambulatory Visit: Payer: Self-pay

## 2022-10-20 MED ORDER — LISDEXAMFETAMINE DIMESYLATE 30 MG PO CAPS
30.0000 mg | ORAL_CAPSULE | Freq: Every day | ORAL | 0 refills | Status: DC
Start: 2022-10-20 — End: 2022-11-17
  Filled 2022-10-20: qty 30, 30d supply, fill #0

## 2022-10-23 ENCOUNTER — Other Ambulatory Visit (HOSPITAL_BASED_OUTPATIENT_CLINIC_OR_DEPARTMENT_OTHER): Payer: Self-pay

## 2022-10-23 MED ORDER — METHYLPREDNISOLONE 4 MG PO TBPK
ORAL_TABLET | ORAL | 0 refills | Status: DC
  Filled 2022-10-23: qty 21, 6d supply, fill #0

## 2022-10-27 ENCOUNTER — Other Ambulatory Visit: Payer: Self-pay | Admitting: Family Medicine

## 2022-10-27 ENCOUNTER — Other Ambulatory Visit (HOSPITAL_BASED_OUTPATIENT_CLINIC_OR_DEPARTMENT_OTHER): Payer: Self-pay

## 2022-10-27 DIAGNOSIS — F5081 Binge eating disorder: Secondary | ICD-10-CM

## 2022-10-27 MED ORDER — AMPHETAMINE-DEXTROAMPHETAMINE 10 MG PO TABS
10.0000 mg | ORAL_TABLET | Freq: Every day | ORAL | 0 refills | Status: DC
Start: 2022-10-27 — End: 2022-11-24
  Filled 2022-10-27: qty 30, 30d supply, fill #0

## 2022-11-17 ENCOUNTER — Other Ambulatory Visit (HOSPITAL_BASED_OUTPATIENT_CLINIC_OR_DEPARTMENT_OTHER): Payer: Self-pay

## 2022-11-17 ENCOUNTER — Other Ambulatory Visit: Payer: Self-pay

## 2022-11-17 ENCOUNTER — Other Ambulatory Visit: Payer: Self-pay | Admitting: Family Medicine

## 2022-11-17 DIAGNOSIS — F5081 Binge eating disorder: Secondary | ICD-10-CM

## 2022-11-17 MED ORDER — LISDEXAMFETAMINE DIMESYLATE 30 MG PO CAPS
30.0000 mg | ORAL_CAPSULE | Freq: Every day | ORAL | 0 refills | Status: DC
Start: 2022-11-17 — End: 2022-12-23
  Filled 2022-11-17: qty 30, 30d supply, fill #0

## 2022-11-24 ENCOUNTER — Other Ambulatory Visit (HOSPITAL_BASED_OUTPATIENT_CLINIC_OR_DEPARTMENT_OTHER): Payer: Self-pay

## 2022-11-24 ENCOUNTER — Other Ambulatory Visit: Payer: Self-pay | Admitting: Family Medicine

## 2022-11-24 DIAGNOSIS — F5081 Binge eating disorder: Secondary | ICD-10-CM

## 2022-11-24 MED ORDER — AMPHETAMINE-DEXTROAMPHETAMINE 10 MG PO TABS
10.0000 mg | ORAL_TABLET | Freq: Every day | ORAL | 0 refills | Status: DC
Start: 2022-11-24 — End: 2023-01-20
  Filled 2022-11-24: qty 30, 30d supply, fill #0

## 2022-11-27 ENCOUNTER — Other Ambulatory Visit (HOSPITAL_BASED_OUTPATIENT_CLINIC_OR_DEPARTMENT_OTHER): Payer: Self-pay

## 2022-11-27 ENCOUNTER — Encounter: Payer: Self-pay | Admitting: Family Medicine

## 2022-11-27 ENCOUNTER — Ambulatory Visit (INDEPENDENT_AMBULATORY_CARE_PROVIDER_SITE_OTHER): Payer: 59 | Admitting: Family Medicine

## 2022-11-27 VITALS — BP 100/70 | HR 80 | Temp 98.3°F | Ht 62.25 in | Wt 166.5 lb

## 2022-11-27 DIAGNOSIS — D500 Iron deficiency anemia secondary to blood loss (chronic): Secondary | ICD-10-CM | POA: Diagnosis not present

## 2022-11-27 DIAGNOSIS — L301 Dyshidrosis [pompholyx]: Secondary | ICD-10-CM

## 2022-11-27 DIAGNOSIS — Z13220 Encounter for screening for lipoid disorders: Secondary | ICD-10-CM | POA: Diagnosis not present

## 2022-11-27 DIAGNOSIS — Z0000 Encounter for general adult medical examination without abnormal findings: Secondary | ICD-10-CM | POA: Diagnosis not present

## 2022-11-27 DIAGNOSIS — E559 Vitamin D deficiency, unspecified: Secondary | ICD-10-CM | POA: Diagnosis not present

## 2022-11-27 DIAGNOSIS — F419 Anxiety disorder, unspecified: Secondary | ICD-10-CM

## 2022-11-27 LAB — COMPREHENSIVE METABOLIC PANEL
ALT: 16 U/L (ref 0–35)
AST: 16 U/L (ref 0–37)
Albumin: 4.6 g/dL (ref 3.5–5.2)
Alkaline Phosphatase: 34 U/L — ABNORMAL LOW (ref 39–117)
BUN: 8 mg/dL (ref 6–23)
CO2: 28 mEq/L (ref 19–32)
Calcium: 10.1 mg/dL (ref 8.4–10.5)
Chloride: 100 mEq/L (ref 96–112)
Creatinine, Ser: 0.86 mg/dL (ref 0.40–1.20)
GFR: 86.64 mL/min (ref >60.00)
Glucose, Bld: 94 mg/dL (ref 70–99)
Potassium: 4.2 mEq/L (ref 3.5–5.1)
Sodium: 135 mEq/L (ref 135–145)
Total Bilirubin: 0.8 mg/dL (ref 0.2–1.2)
Total Protein: 7.1 g/dL (ref 6.0–8.3)

## 2022-11-27 LAB — CBC WITH DIFFERENTIAL/PLATELET
Basophils Absolute: 0.1 10*3/uL (ref 0.0–0.1)
Basophils Relative: 1.1 % (ref 0.0–3.0)
Eosinophils Absolute: 0.2 10*3/uL (ref 0.0–0.7)
Eosinophils Relative: 2.9 % (ref 0.0–5.0)
HCT: 37.5 % (ref 36.0–46.0)
Hemoglobin: 12.3 g/dL (ref 12.0–15.0)
Lymphocytes Relative: 24.9 % (ref 12.0–46.0)
Lymphs Abs: 1.6 10*3/uL (ref 0.7–4.0)
MCHC: 32.8 g/dL (ref 30.0–36.0)
MCV: 84.4 fl (ref 78.0–100.0)
Monocytes Absolute: 0.6 10*3/uL (ref 0.1–1.0)
Monocytes Relative: 10 % (ref 3.0–12.0)
Neutro Abs: 3.9 10*3/uL (ref 1.4–7.7)
Neutrophils Relative %: 61.1 % (ref 43.0–77.0)
Platelets: 342 10*3/uL (ref 150.0–400.0)
RBC: 4.44 Mil/uL (ref 3.87–5.11)
RDW: 13.4 % (ref 11.5–15.5)
WBC: 6.4 10*3/uL (ref 4.0–10.5)

## 2022-11-27 LAB — FERRITIN: Ferritin: 6.4 ng/mL — ABNORMAL LOW (ref 10.0–291.0)

## 2022-11-27 LAB — LIPID PANEL
Cholesterol: 190 mg/dL (ref 0–200)
HDL: 55 mg/dL (ref >39.00)
LDL Cholesterol: 118 mg/dL — ABNORMAL HIGH (ref 0–99)
NonHDL: 134.65
Total CHOL/HDL Ratio: 3
Triglycerides: 83 mg/dL (ref 0.0–149.0)
VLDL: 16.6 mg/dL (ref 0.0–40.0)

## 2022-11-27 LAB — VITAMIN D 25 HYDROXY (VIT D DEFICIENCY, FRACTURES): VITD: 21.8 ng/mL — ABNORMAL LOW (ref 30.00–100.00)

## 2022-11-27 MED ORDER — ESCITALOPRAM OXALATE 10 MG PO TABS
10.0000 mg | ORAL_TABLET | Freq: Every day | ORAL | 1 refills | Status: AC
Start: 2022-11-27 — End: ?
  Filled 2022-11-27: qty 30, 30d supply, fill #0
  Filled 2023-02-10: qty 90, 90d supply, fill #0

## 2022-11-27 MED ORDER — TRIAMCINOLONE ACETONIDE 0.1 % EX CREA
1.0000 | TOPICAL_CREAM | Freq: Two times a day (BID) | CUTANEOUS | 2 refills | Status: DC
Start: 2022-11-27 — End: 2024-03-16
  Filled 2022-11-27 – 2023-03-23 (×2): qty 30, 15d supply, fill #0

## 2022-11-27 NOTE — Patient Instructions (Signed)

## 2022-11-27 NOTE — Progress Notes (Signed)
Complete physical exam  Patient: Sonia [name]   DOB: 09/15/1985   37 y.o. Female  MRN: [id number]  Subjective:    Chief Complaint  Patient presents with   Annual Exam    Sonia [name] is a 37 y.o. female who presents today for a complete physical exam. She reports consuming a general diet. Home exercise routine includes walking 0.5 hrs per day. She generally feels well. She reports sleeping has been under some stress, got a new job offer and she has been having nighttime awakenings. She does not have additional problems to discuss today.    Most recent fall risk assessment:     No data to display           Most recent depression screenings:    11/27/2022    8:11 AM 06/24/2022   11:32 AM  PHQ 2/9 Scores  PHQ - 2 Score 0 0  PHQ- 9 Score 9 6    Vision:Not within last year  and Dental: No current dental problems and Receives regular dental care  Patient Active Problem List   Diagnosis Date Noted   Dyshidrotic eczema 11/27/2022   Spinal stenosis of cervical region 08/28/2022   Cervical pain (neck) 08/21/2022   Protrusion of cervical intervertebral disc 08/21/2022   Impingement syndrome of right shoulder 08/21/2022   Binge eating disorder 01/24/2022   Palpitations 11/26/2021   Abdominal pain, epigastric    Hypoglycemia without diagnosis of diabetes mellitus 05/20/2020   Hemochromatosis 05/20/2020   Acute pancreatitis 05/19/2020   Iron deficiency anemia due to chronic blood loss 02/14/2020   Moderate persistent asthma 09/06/2019   Insomnia 09/29/2018   Bulimia nervosa, purging type 09/15/2018   Moderate major depression (HCC) 09/15/2018   Crohn's disease of colon without complication (HCC) 03/01/2018   LUQ abdominal pain 03/01/2018   Crohn's disease (HCC) 08/30/2014   Headache disorder 08/30/2014   Anxiety 06/18/2009   GERD 05/23/2009      Patient Care Team: Karie Georges, MD as PCP - General (Family Medicine) Jaymes Graff, MD as Consulting Physician (Obstetrics and Gynecology)   Outpatient Medications Prior to Visit  Medication Sig   albuterol (VENTOLIN HFA) 108 (90 Base) MCG/ACT inhaler Inhale 1-2 puffs into the lungs every 4 (four) hours as needed for wheezing or shortness of breath. (Patient taking differently: Inhale 1-2 puffs into the lungs every 4 (four) hours as needed for wheezing or shortness of breath. As needed)   amphetamine-dextroamphetamine (ADDERALL) 10 MG tablet Take 1 tablet (10 mg total) by mouth daily after lunch.   budesonide-formoterol (SYMBICORT) 80-4.5 MCG/ACT inhaler Inhale 2 puffs into the lungs 2 (two) times a day. As needed   buPROPion (WELLBUTRIN XL) 150 MG 24 hr tablet Take 1 tablet (150 mg total) by mouth daily.   cetirizine (ZYRTEC) 10 MG tablet Take 10 mg by mouth daily.   dicyclomine (BENTYL) 20 MG tablet Take 1 tablet (20 mg total) by mouth 3 (three) times daily. (Patient taking differently: Take 20 mg by mouth as needed.)   ibuprofen (ADVIL) 800 MG tablet Take 800 mg by mouth 2 (two) times daily.   lisdexamfetamine (VYVANSE) 30 MG capsule Take 1 capsule (30 mg total) by mouth daily.   traZODone (DESYREL) 50 MG tablet Take 1 tablet (50 mg total) by mouth at bedtime.   [DISCONTINUED] escitalopram (LEXAPRO) 10 MG tablet Take 1 tablet (10 mg total) by mouth daily.   [DISCONTINUED] fluconazole (DIFLUCAN) 150 MG tablet Take one tablet by  mouth daily on days 1, 4, 7 as needed (Patient not taking: Reported on 08/21/2022)   [DISCONTINUED] gabapentin (NEURONTIN) 300 MG capsule Take 1 capsule (300 mg total) by mouth 2 (two) times daily.   [DISCONTINUED] nitrofurantoin, macrocrystal-monohydrate, (MACROBID) 100 MG capsule Take 1 capsule (100 mg total) by mouth 2 (two) times daily.   No facility-administered medications prior to visit.    Review of Systems  HENT:  Negative for hearing loss.   Eyes:  Negative for blurred vision.  Respiratory:  Negative for shortness of breath.    Cardiovascular:  Negative for chest pain.  Gastrointestinal: Negative.   Genitourinary: Negative.   Musculoskeletal:  Negative for back pain.  Neurological:  Negative for headaches.  Psychiatric/Behavioral:  Negative for depression.   All other systems reviewed and are negative.      Objective:     BP 100/70 (BP Location: Left Arm, Patient Position: Sitting, Cuff Size: Normal)   Pulse 80   Temp 98.3 F (36.8 C) (Oral)   Ht 5' 2.25" (1.581 m)   Wt 166 lb 8 oz (75.5 kg)   LMP 10/29/2022 (Exact Date)   SpO2 99%   BMI 30.21 kg/m    Physical Exam Vitals reviewed.  Constitutional:      Appearance: Normal appearance. She is well-groomed and normal weight.  HENT:     Right Ear: Tympanic membrane normal.     Left Ear: Tympanic membrane normal.     Mouth/Throat:     Mouth: Mucous membranes are moist.     Pharynx: No posterior oropharyngeal erythema.  Eyes:     Conjunctiva/sclera: Conjunctivae normal.  Neck:     Thyroid: No thyromegaly.  Cardiovascular:     Rate and Rhythm: Normal rate and regular rhythm.     Pulses: Normal pulses.     Heart sounds: S1 normal and S2 normal.  Pulmonary:     Effort: Pulmonary effort is normal.     Breath sounds: Normal breath sounds and air entry.  Abdominal:     General: Bowel sounds are normal.  Musculoskeletal:     Right lower leg: No edema.     Left lower leg: No edema.  Lymphadenopathy:     Cervical: No cervical adenopathy.  Neurological:     Mental Status: She is alert and oriented to person, place, and time. Mental status is at baseline.     Gait: Gait is intact.  Psychiatric:        Mood and Affect: Mood and affect normal.        Speech: Speech normal.        Behavior: Behavior normal.        Judgment: Judgment normal.      No results found for any visits on 11/27/22.     Assessment & Plan:    Routine Health Maintenance and Physical Exam  Immunization History  Administered Date(s) Administered   HPV 9-valent  11/16/2018   Influenza, Seasonal, Injecte, Preservative Fre 02/13/2014   Influenza,inj,Quad PF,6+ Mos 02/16/2019   PPD Test 01/19/2018   Tdap 05/30/2014, 04/07/2021    Health Maintenance  Topic Date Due   HPV VACCINES (2 - 3-dose SCDM series) 12/14/2018   COVID-19 Vaccine (1 - 2023-24 season) 12/13/2022 (Originally 01/17/2022)   INFLUENZA VACCINE  12/18/2022   PAP SMEAR-Modifier  01/17/2025   DTaP/Tdap/Td (3 - Td or Tdap) 04/08/2031   Hepatitis C Screening  Completed   HIV Screening  Completed    Discussed health benefits of physical activity, and  encouraged her to engage in regular exercise appropriate for her age and condition.  Vitamin D deficiency -     VITAMIN D 25 Hydroxy (Vit-D Deficiency, Fractures)  Iron deficiency anemia due to chronic blood loss -     CBC with Differential/Platelet; Future -     Ferritin  Lipid screening -     Lipid panel; Future  Routine general medical examination at a health care facility -    Normal physical exam findings today, we discussed healthy sleep patterns and discussed reducing her trazodone at night/ backing it up earlier in the evening to help with the morning grogginess. Handouts given on healthy exercise and eating patterns.    --   Comprehensive metabolic panel  Anxiety -     Escitalopram Oxalate; Take 1 tablet (10 mg total) by mouth daily.  Dispense: 90 tablet; Refill: 1  Dyshidrotic eczema -     Triamcinolone Acetonide; Apply 1 Application topically 2 (two) times daily.  Dispense: 30 g; Refill: 2    Return in 6 months (on 05/30/2023).     Karie Georges, MD

## 2022-12-08 ENCOUNTER — Other Ambulatory Visit (HOSPITAL_BASED_OUTPATIENT_CLINIC_OR_DEPARTMENT_OTHER): Payer: Self-pay

## 2022-12-10 ENCOUNTER — Other Ambulatory Visit (HOSPITAL_BASED_OUTPATIENT_CLINIC_OR_DEPARTMENT_OTHER): Payer: Self-pay

## 2022-12-19 ENCOUNTER — Other Ambulatory Visit (HOSPITAL_BASED_OUTPATIENT_CLINIC_OR_DEPARTMENT_OTHER): Payer: Self-pay

## 2022-12-19 MED ORDER — METHYLPREDNISOLONE 4 MG PO TBPK
ORAL_TABLET | ORAL | 0 refills | Status: DC
  Filled 2022-12-19: qty 21, 6d supply, fill #0

## 2022-12-23 ENCOUNTER — Other Ambulatory Visit: Payer: Self-pay | Admitting: Family Medicine

## 2022-12-23 DIAGNOSIS — F5081 Binge eating disorder: Secondary | ICD-10-CM

## 2022-12-24 ENCOUNTER — Other Ambulatory Visit (HOSPITAL_BASED_OUTPATIENT_CLINIC_OR_DEPARTMENT_OTHER): Payer: Self-pay

## 2022-12-24 MED ORDER — LISDEXAMFETAMINE DIMESYLATE 30 MG PO CAPS
30.0000 mg | ORAL_CAPSULE | Freq: Every day | ORAL | 0 refills | Status: DC
Start: 2022-12-24 — End: 2023-01-20
  Filled 2022-12-24: qty 30, 30d supply, fill #0

## 2023-01-20 ENCOUNTER — Other Ambulatory Visit: Payer: Self-pay | Admitting: Family Medicine

## 2023-01-20 DIAGNOSIS — F5081 Binge eating disorder: Secondary | ICD-10-CM

## 2023-01-21 ENCOUNTER — Encounter (HOSPITAL_COMMUNITY): Payer: Self-pay | Admitting: Hematology

## 2023-01-21 ENCOUNTER — Encounter: Payer: Self-pay | Admitting: Family Medicine

## 2023-01-21 ENCOUNTER — Other Ambulatory Visit (HOSPITAL_BASED_OUTPATIENT_CLINIC_OR_DEPARTMENT_OTHER): Payer: Self-pay

## 2023-01-21 DIAGNOSIS — F5081 Binge eating disorder: Secondary | ICD-10-CM

## 2023-01-21 MED ORDER — AMPHETAMINE-DEXTROAMPHETAMINE 10 MG PO TABS
10.0000 mg | ORAL_TABLET | Freq: Every day | ORAL | 0 refills | Status: DC
Start: 2023-01-21 — End: 2023-03-13
  Filled 2023-01-21: qty 30, 30d supply, fill #0

## 2023-01-21 MED ORDER — LISDEXAMFETAMINE DIMESYLATE 30 MG PO CAPS
30.0000 mg | ORAL_CAPSULE | Freq: Every day | ORAL | 0 refills | Status: DC
Start: 2023-01-21 — End: 2023-01-22
  Filled 2023-01-21: qty 30, 30d supply, fill #0

## 2023-01-22 ENCOUNTER — Other Ambulatory Visit (HOSPITAL_BASED_OUTPATIENT_CLINIC_OR_DEPARTMENT_OTHER): Payer: Self-pay

## 2023-01-22 MED ORDER — LISDEXAMFETAMINE DIMESYLATE 30 MG PO CAPS: 30.0000 mg | ORAL_CAPSULE | Freq: Every day | ORAL | 0 refills | Status: DC

## 2023-02-10 ENCOUNTER — Other Ambulatory Visit (HOSPITAL_BASED_OUTPATIENT_CLINIC_OR_DEPARTMENT_OTHER): Payer: Self-pay

## 2023-02-10 ENCOUNTER — Other Ambulatory Visit: Payer: Self-pay

## 2023-02-10 ENCOUNTER — Encounter (HOSPITAL_COMMUNITY): Payer: Self-pay | Admitting: Hematology

## 2023-02-13 ENCOUNTER — Other Ambulatory Visit: Payer: Self-pay

## 2023-02-13 ENCOUNTER — Encounter (HOSPITAL_COMMUNITY): Payer: Self-pay | Admitting: Hematology

## 2023-02-13 ENCOUNTER — Other Ambulatory Visit (HOSPITAL_BASED_OUTPATIENT_CLINIC_OR_DEPARTMENT_OTHER): Payer: Self-pay

## 2023-02-19 ENCOUNTER — Telehealth: Payer: Self-pay | Admitting: *Deleted

## 2023-02-19 ENCOUNTER — Other Ambulatory Visit (HOSPITAL_BASED_OUTPATIENT_CLINIC_OR_DEPARTMENT_OTHER): Payer: Self-pay

## 2023-02-19 DIAGNOSIS — F419 Anxiety disorder, unspecified: Secondary | ICD-10-CM

## 2023-02-19 MED ORDER — ESCITALOPRAM OXALATE 10 MG PO TABS
10.0000 mg | ORAL_TABLET | Freq: Every day | ORAL | 1 refills | Status: AC
Start: 2023-02-19 — End: ?

## 2023-02-19 NOTE — Telephone Encounter (Signed)
Rx done. 

## 2023-02-20 ENCOUNTER — Other Ambulatory Visit (HOSPITAL_COMMUNITY): Payer: Self-pay

## 2023-02-20 ENCOUNTER — Other Ambulatory Visit: Payer: Self-pay | Admitting: Family Medicine

## 2023-02-20 DIAGNOSIS — F50819 Binge eating disorder, unspecified: Secondary | ICD-10-CM

## 2023-02-20 MED ORDER — LISDEXAMFETAMINE DIMESYLATE 30 MG PO CAPS
30.0000 mg | ORAL_CAPSULE | Freq: Every day | ORAL | 0 refills | Status: DC
Start: 2023-02-20 — End: 2023-03-23

## 2023-02-24 ENCOUNTER — Other Ambulatory Visit (HOSPITAL_BASED_OUTPATIENT_CLINIC_OR_DEPARTMENT_OTHER): Payer: Self-pay

## 2023-03-04 ENCOUNTER — Encounter (HOSPITAL_COMMUNITY): Payer: Self-pay | Admitting: Hematology

## 2023-03-04 ENCOUNTER — Other Ambulatory Visit (HOSPITAL_BASED_OUTPATIENT_CLINIC_OR_DEPARTMENT_OTHER): Payer: Self-pay

## 2023-03-04 MED ORDER — SULFAMETHOXAZOLE-TRIMETHOPRIM 800-160 MG PO TABS
1.0000 | ORAL_TABLET | Freq: Two times a day (BID) | ORAL | 0 refills | Status: DC
  Filled 2023-03-04: qty 6, 3d supply, fill #0

## 2023-03-08 ENCOUNTER — Encounter (HOSPITAL_COMMUNITY): Payer: Self-pay | Admitting: Hematology

## 2023-03-13 ENCOUNTER — Encounter: Payer: Self-pay | Admitting: Family Medicine

## 2023-03-13 DIAGNOSIS — F50819 Binge eating disorder, unspecified: Secondary | ICD-10-CM

## 2023-03-13 MED ORDER — AMPHETAMINE-DEXTROAMPHETAMINE 10 MG PO TABS
10.0000 mg | ORAL_TABLET | Freq: Every day | ORAL | 0 refills | Status: DC
Start: 2023-03-13 — End: 2023-04-15
  Filled 2023-03-16: qty 30, 30d supply, fill #0

## 2023-03-13 NOTE — Telephone Encounter (Signed)
Rx left at the front desk for pickup

## 2023-03-16 ENCOUNTER — Other Ambulatory Visit (HOSPITAL_BASED_OUTPATIENT_CLINIC_OR_DEPARTMENT_OTHER): Payer: Self-pay

## 2023-03-16 ENCOUNTER — Encounter (HOSPITAL_COMMUNITY): Payer: Self-pay | Admitting: Hematology

## 2023-03-23 ENCOUNTER — Other Ambulatory Visit (HOSPITAL_BASED_OUTPATIENT_CLINIC_OR_DEPARTMENT_OTHER): Payer: Self-pay

## 2023-03-23 ENCOUNTER — Other Ambulatory Visit: Payer: Self-pay

## 2023-03-23 ENCOUNTER — Other Ambulatory Visit: Payer: Self-pay | Admitting: Family Medicine

## 2023-03-23 DIAGNOSIS — F50819 Binge eating disorder, unspecified: Secondary | ICD-10-CM

## 2023-03-23 MED ORDER — LISDEXAMFETAMINE DIMESYLATE 30 MG PO CAPS
30.0000 mg | ORAL_CAPSULE | Freq: Every day | ORAL | 0 refills | Status: DC
Start: 2023-03-23 — End: 2023-04-15
  Filled 2023-03-23: qty 30, 30d supply, fill #0

## 2023-04-15 ENCOUNTER — Other Ambulatory Visit (HOSPITAL_BASED_OUTPATIENT_CLINIC_OR_DEPARTMENT_OTHER): Payer: Self-pay

## 2023-04-15 ENCOUNTER — Other Ambulatory Visit: Payer: Self-pay | Admitting: Family Medicine

## 2023-04-15 ENCOUNTER — Encounter: Payer: Self-pay | Admitting: Family Medicine

## 2023-04-15 DIAGNOSIS — F50819 Binge eating disorder, unspecified: Secondary | ICD-10-CM

## 2023-04-20 ENCOUNTER — Other Ambulatory Visit (HOSPITAL_BASED_OUTPATIENT_CLINIC_OR_DEPARTMENT_OTHER): Payer: Self-pay

## 2023-04-20 ENCOUNTER — Other Ambulatory Visit: Payer: Self-pay

## 2023-04-20 MED ORDER — LISDEXAMFETAMINE DIMESYLATE 30 MG PO CAPS
30.0000 mg | ORAL_CAPSULE | Freq: Every day | ORAL | 0 refills | Status: DC
Start: 2023-04-20 — End: 2023-05-21
  Filled 2023-04-20: qty 30, 30d supply, fill #0

## 2023-04-20 MED ORDER — AMPHETAMINE-DEXTROAMPHETAMINE 10 MG PO TABS
10.0000 mg | ORAL_TABLET | Freq: Every day | ORAL | 0 refills | Status: DC
Start: 2023-04-20 — End: 2023-05-21
  Filled 2023-04-20: qty 30, 30d supply, fill #0

## 2023-04-21 DIAGNOSIS — I863 Vulval varices: Secondary | ICD-10-CM | POA: Diagnosis not present

## 2023-04-21 DIAGNOSIS — N644 Mastodynia: Secondary | ICD-10-CM | POA: Diagnosis not present

## 2023-04-28 ENCOUNTER — Other Ambulatory Visit (HOSPITAL_BASED_OUTPATIENT_CLINIC_OR_DEPARTMENT_OTHER): Payer: Self-pay

## 2023-04-28 MED ORDER — METHYLPREDNISOLONE 4 MG PO TBPK
ORAL_TABLET | ORAL | 0 refills | Status: DC
  Filled 2023-04-28: qty 21, 6d supply, fill #0

## 2023-05-06 ENCOUNTER — Encounter (HOSPITAL_COMMUNITY): Payer: Self-pay | Admitting: Hematology

## 2023-05-06 ENCOUNTER — Encounter: Payer: Self-pay | Admitting: Family Medicine

## 2023-05-21 ENCOUNTER — Other Ambulatory Visit: Payer: Self-pay | Admitting: Family Medicine

## 2023-05-21 ENCOUNTER — Other Ambulatory Visit (HOSPITAL_BASED_OUTPATIENT_CLINIC_OR_DEPARTMENT_OTHER): Payer: Self-pay

## 2023-05-21 DIAGNOSIS — F50819 Binge eating disorder, unspecified: Secondary | ICD-10-CM

## 2023-05-21 MED ORDER — LISDEXAMFETAMINE DIMESYLATE 30 MG PO CAPS
30.0000 mg | ORAL_CAPSULE | Freq: Every day | ORAL | 0 refills | Status: DC
Start: 2023-05-21 — End: 2023-06-22
  Filled 2023-05-21 (×2): qty 30, 30d supply, fill #0

## 2023-05-21 MED ORDER — AMPHETAMINE-DEXTROAMPHETAMINE 10 MG PO TABS
10.0000 mg | ORAL_TABLET | Freq: Every day | ORAL | 0 refills | Status: DC
Start: 2023-05-21 — End: 2023-06-22
  Filled 2023-05-21: qty 30, 30d supply, fill #0

## 2023-05-28 ENCOUNTER — Other Ambulatory Visit (HOSPITAL_BASED_OUTPATIENT_CLINIC_OR_DEPARTMENT_OTHER): Payer: Self-pay

## 2023-05-28 DIAGNOSIS — Z124 Encounter for screening for malignant neoplasm of cervix: Secondary | ICD-10-CM | POA: Diagnosis not present

## 2023-05-28 DIAGNOSIS — Z01419 Encounter for gynecological examination (general) (routine) without abnormal findings: Secondary | ICD-10-CM | POA: Diagnosis not present

## 2023-05-28 DIAGNOSIS — Z1331 Encounter for screening for depression: Secondary | ICD-10-CM | POA: Diagnosis not present

## 2023-05-28 DIAGNOSIS — Z01411 Encounter for gynecological examination (general) (routine) with abnormal findings: Secondary | ICD-10-CM | POA: Diagnosis not present

## 2023-05-28 DIAGNOSIS — M5412 Radiculopathy, cervical region: Secondary | ICD-10-CM | POA: Diagnosis not present

## 2023-05-28 DIAGNOSIS — Z6831 Body mass index (BMI) 31.0-31.9, adult: Secondary | ICD-10-CM | POA: Diagnosis not present

## 2023-05-28 DIAGNOSIS — M542 Cervicalgia: Secondary | ICD-10-CM | POA: Diagnosis not present

## 2023-05-28 MED ORDER — METHOCARBAMOL 500 MG PO TABS
500.0000 mg | ORAL_TABLET | Freq: Three times a day (TID) | ORAL | 0 refills | Status: DC
  Filled 2023-05-28: qty 40, 14d supply, fill #0

## 2023-06-01 DIAGNOSIS — H31092 Other chorioretinal scars, left eye: Secondary | ICD-10-CM | POA: Diagnosis not present

## 2023-06-22 ENCOUNTER — Other Ambulatory Visit: Payer: Self-pay | Admitting: Family Medicine

## 2023-06-22 ENCOUNTER — Encounter: Payer: Self-pay | Admitting: *Deleted

## 2023-06-22 DIAGNOSIS — F50819 Binge eating disorder, unspecified: Secondary | ICD-10-CM

## 2023-06-22 NOTE — Telephone Encounter (Signed)
Patient needs appt-- she was supposed to be seen in January. please schedule the appt for her and then I will fil.

## 2023-06-23 ENCOUNTER — Other Ambulatory Visit (HOSPITAL_BASED_OUTPATIENT_CLINIC_OR_DEPARTMENT_OTHER): Payer: Self-pay

## 2023-06-23 MED ORDER — LISDEXAMFETAMINE DIMESYLATE 30 MG PO CAPS
30.0000 mg | ORAL_CAPSULE | Freq: Every day | ORAL | 0 refills | Status: DC
Start: 2023-06-23 — End: 2023-07-10
  Filled 2023-06-23: qty 30, 30d supply, fill #0

## 2023-06-23 MED ORDER — AMPHETAMINE-DEXTROAMPHETAMINE 10 MG PO TABS
10.0000 mg | ORAL_TABLET | Freq: Every day | ORAL | 0 refills | Status: DC
Start: 2023-06-23 — End: 2023-07-10
  Filled 2023-06-23: qty 30, 30d supply, fill #0

## 2023-06-23 NOTE — Telephone Encounter (Signed)
Patient is scheduled 07/10/23.

## 2023-06-23 NOTE — Telephone Encounter (Signed)
Pt needs appt-- was supposed to be seen in January, please schedule her an appt then I will fill until her appt date

## 2023-06-24 DIAGNOSIS — M5011 Cervical disc disorder with radiculopathy,  high cervical region: Secondary | ICD-10-CM | POA: Diagnosis not present

## 2023-06-24 DIAGNOSIS — Z981 Arthrodesis status: Secondary | ICD-10-CM | POA: Diagnosis not present

## 2023-06-24 DIAGNOSIS — M4802 Spinal stenosis, cervical region: Secondary | ICD-10-CM | POA: Diagnosis not present

## 2023-06-24 DIAGNOSIS — M5412 Radiculopathy, cervical region: Secondary | ICD-10-CM | POA: Diagnosis not present

## 2023-07-01 ENCOUNTER — Encounter: Payer: Self-pay | Admitting: Gastroenterology

## 2023-07-09 ENCOUNTER — Encounter (HOSPITAL_COMMUNITY): Payer: Self-pay | Admitting: Hematology

## 2023-07-10 ENCOUNTER — Encounter: Payer: Self-pay | Admitting: Family Medicine

## 2023-07-10 ENCOUNTER — Encounter (HOSPITAL_COMMUNITY): Payer: Self-pay | Admitting: Hematology

## 2023-07-10 ENCOUNTER — Other Ambulatory Visit (HOSPITAL_BASED_OUTPATIENT_CLINIC_OR_DEPARTMENT_OTHER): Payer: Self-pay

## 2023-07-10 ENCOUNTER — Telehealth (INDEPENDENT_AMBULATORY_CARE_PROVIDER_SITE_OTHER): Payer: BC Managed Care – PPO | Admitting: Family Medicine

## 2023-07-10 VITALS — Temp 97.6°F | Ht 62.0 in | Wt 168.0 lb

## 2023-07-10 DIAGNOSIS — F50819 Binge eating disorder, unspecified: Secondary | ICD-10-CM | POA: Diagnosis not present

## 2023-07-10 DIAGNOSIS — L239 Allergic contact dermatitis, unspecified cause: Secondary | ICD-10-CM | POA: Diagnosis not present

## 2023-07-10 MED ORDER — LISDEXAMFETAMINE DIMESYLATE 30 MG PO CAPS
30.0000 mg | ORAL_CAPSULE | Freq: Every day | ORAL | 0 refills | Status: DC
Start: 2023-07-10 — End: 2023-08-29
  Filled 2023-07-10 – 2023-07-30 (×2): qty 30, 30d supply, fill #0

## 2023-07-10 MED ORDER — AMPHETAMINE-DEXTROAMPHETAMINE 10 MG PO TABS
10.0000 mg | ORAL_TABLET | Freq: Every day | ORAL | 0 refills | Status: DC
Start: 2023-07-10 — End: 2023-08-29
  Filled 2023-07-10 – 2023-07-30 (×2): qty 30, 30d supply, fill #0

## 2023-07-10 MED ORDER — METHYLPREDNISOLONE 4 MG PO TBPK
ORAL_TABLET | ORAL | 0 refills | Status: AC
Start: 2023-07-10 — End: ?
  Filled 2023-07-10: qty 21, 6d supply, fill #0

## 2023-07-10 NOTE — Progress Notes (Signed)
 Virtual Medical Office Visit  Patient:  Sonia [name]      Age: 38 y.o.       Sex:  female  Date:   07/19/2023  PCP:    Karie Georges, MD   Today's Healthcare Provider: Karie Georges, MD    Assessment/Plan:   Summary assessment:  Sonia [name] was seen today for medical management of chronic issues.  Allergic dermatitis I reviewed the photos uploaded by the patient, it looks like some sort of dermatitis, could be allergic vs from the acute infection. Will treat the patient with medrol dose pak  -     methylPREDNISolone; Take by mouth as directed  Dispense: 21 each; Refill: 0  Binge eating disorder Assessment & Plan: 3 month supply of vyvanse 30 mg daily has been written. Will continue a short acting 10 mg adderall in the afternoon as this seems to be helping her symptoms later in the day  Orders: -     Amphetamine-Dextroamphetamine; Take 1 tablet (10 mg total) by mouth daily after lunch.  Dispense: 30 tablet; Refill: 0 -     Lisdexamfetamine Dimesylate; Take 1 capsule (30 mg total) by mouth daily.  Dispense: 30 capsule; Refill: 0     No follow-ups on file.   She was advised to call the office or go to ER if her condition worsens    Subjective:   Sonia [name] is a 38 y.o. female with PMH significant for: Past Medical History:  Diagnosis Date   Allergy    Anemia    Anxiety    Asthma    Blood transfusion without reported diagnosis    iron infusion   COVID-19    Crohn's colitis (HCC)    Depression    GERD (gastroesophageal reflux disease)    no meds   Normal labor 03/27/2013   Postpartum care following cesarean delivery (11/9) 03/27/2013     Presenting today with: Chief Complaint  Patient presents with   Medical Management of Chronic Issues    Pt is on video call for med refill for Adderral and Vynanse. Pt is no longer taking trazodone.      She clarifies and reports that her condition: Pt is reporting she had COVID last  month, and now she has Flu A. States that after the COVID infection she started getting an itchy rash all over her body and her fingernails started peeling/chipping. States that the rash is intensely itchy and she is concerned about an autoimmune reaction. She reports that her upper respiratory symptoms are getting better however.  Binge eating d/o / ADHD-- pt remains stable on current stimulants, she has been on these for many years and she reports her symptoms are well controlled.   She denies having any: Side effects to the medication.           Objective/Observations  Physical Exam:  Polite and friendly Gen: NAD, resting comfortably Pulm: Normal work of breathing Neuro: Grossly normal, moves all extremities Psych: Normal affect and thought content Problem specific physical exam findings:    No images are attached to the encounter or orders placed in the encounter.    Results: No results found for any visits on 07/10/23.   No results found for this or any previous visit (from the past 2160 hours).         Virtual Visit via Video   I connected with Sonia [name] on 07/19/23 at  3:30 PM EST by a  video enabled telemedicine application and verified that I am speaking with the correct person using two identifiers. The limitations of evaluation and management by telemedicine and the availability of in person appointments were discussed. The patient expressed understanding and agreed to proceed.   Percentage of appointment time on video:  100% Patient location: Home Provider location: [place] [facility] Persons participating in the virtual visit: Myself and Patient

## 2023-07-19 NOTE — Assessment & Plan Note (Signed)
 3 month supply of vyvanse 30 mg daily has been written. Will continue a short acting 10 mg adderall in the afternoon as this seems to be helping her symptoms later in the day

## 2023-07-21 DIAGNOSIS — Z6830 Body mass index (BMI) 30.0-30.9, adult: Secondary | ICD-10-CM | POA: Diagnosis not present

## 2023-07-21 DIAGNOSIS — M5412 Radiculopathy, cervical region: Secondary | ICD-10-CM | POA: Diagnosis not present

## 2023-07-30 ENCOUNTER — Other Ambulatory Visit (HOSPITAL_BASED_OUTPATIENT_CLINIC_OR_DEPARTMENT_OTHER): Payer: Self-pay

## 2023-07-31 ENCOUNTER — Other Ambulatory Visit

## 2023-07-31 ENCOUNTER — Encounter: Payer: Self-pay | Admitting: Gastroenterology

## 2023-07-31 ENCOUNTER — Ambulatory Visit: Payer: BC Managed Care – PPO | Admitting: Gastroenterology

## 2023-07-31 VITALS — BP 120/90 | HR 71 | Ht 62.0 in | Wt 173.0 lb

## 2023-07-31 DIAGNOSIS — K5010 Crohn's disease of large intestine without complications: Secondary | ICD-10-CM

## 2023-07-31 DIAGNOSIS — D500 Iron deficiency anemia secondary to blood loss (chronic): Secondary | ICD-10-CM

## 2023-07-31 DIAGNOSIS — R195 Other fecal abnormalities: Secondary | ICD-10-CM

## 2023-07-31 DIAGNOSIS — K921 Melena: Secondary | ICD-10-CM

## 2023-07-31 DIAGNOSIS — E559 Vitamin D deficiency, unspecified: Secondary | ICD-10-CM | POA: Diagnosis not present

## 2023-07-31 DIAGNOSIS — D509 Iron deficiency anemia, unspecified: Secondary | ICD-10-CM

## 2023-07-31 LAB — COMPREHENSIVE METABOLIC PANEL
ALT: 15 U/L (ref 0–35)
AST: 16 U/L (ref 0–37)
Albumin: 4.4 g/dL (ref 3.5–5.2)
Alkaline Phosphatase: 31 U/L — ABNORMAL LOW (ref 39–117)
BUN: 7 mg/dL (ref 6–23)
CO2: 29 meq/L (ref 19–32)
Calcium: 9.5 mg/dL (ref 8.4–10.5)
Chloride: 102 meq/L (ref 96–112)
Creatinine, Ser: 0.81 mg/dL (ref 0.40–1.20)
GFR: 92.66 mL/min (ref >60.00)
Glucose, Bld: 96 mg/dL (ref 70–99)
Potassium: 4.2 meq/L (ref 3.5–5.1)
Sodium: 138 meq/L (ref 135–145)
Total Bilirubin: 0.6 mg/dL (ref 0.2–1.2)
Total Protein: 7.1 g/dL (ref 6.0–8.3)

## 2023-07-31 LAB — SEDIMENTATION RATE: Sed Rate: 23 mm/h — ABNORMAL HIGH (ref 0–20)

## 2023-07-31 LAB — C-REACTIVE PROTEIN: CRP: <1 mg/dL (ref 0.5–20.0)

## 2023-07-31 LAB — CBC
HCT: 35.4 % — ABNORMAL LOW (ref 36.0–46.0)
Hemoglobin: 11.6 g/dL — ABNORMAL LOW (ref 12.0–15.0)
MCHC: 32.9 g/dL (ref 30.0–36.0)
MCV: 81.8 fl (ref 78.0–100.0)
Platelets: 339 10*3/uL (ref 150.0–400.0)
RBC: 4.33 Mil/uL (ref 3.87–5.11)
RDW: 16.2 % — ABNORMAL HIGH (ref 11.5–15.5)
WBC: 5.6 10*3/uL (ref 4.0–10.5)

## 2023-07-31 LAB — B12 AND FOLATE PANEL
Folate: 5 ng/mL — ABNORMAL LOW (ref >5.9)
Vitamin B-12: 587 pg/mL (ref 211–911)

## 2023-07-31 LAB — IBC + FERRITIN
Ferritin: 4.6 ng/mL — ABNORMAL LOW (ref 10.0–291.0)
Iron: 77 ug/dL (ref 42–145)
Saturation Ratios: 19.8 % — ABNORMAL LOW (ref 20.0–50.0)
TIBC: 389.2 ug/dL (ref 250.0–450.0)
Transferrin: 278 mg/dL (ref 212.0–360.0)

## 2023-07-31 LAB — VITAMIN D 25 HYDROXY (VIT D DEFICIENCY, FRACTURES): VITD: 16.62 ng/mL — ABNORMAL LOW (ref 30.00–100.00)

## 2023-07-31 NOTE — Patient Instructions (Addendum)
 _______________________________________________________  If your blood pressure at your visit was 140/90 or greater, please contact your primary care physician to follow up on this.  If you are age 38 or younger, your body mass index should be between 19-25. Your Body mass index is 31.64 kg/m. If this is out of the aformentioned range listed, please consider follow up with your Primary Care Provider.  ________________________________________________________  The [facility] providers would like to encourage you to use [facility] to communicate with providers for non-urgent requests or questions.  Due to long hold times on the telephone, sending your provider a message by [facility] may be a faster and more efficient way to get a response.  Please allow 48 business hours for a response.  Please remember that this is for non-urgent requests.  _______________________________________________________  Your provider has requested that you go to the basement level for lab work before leaving today. Press "B" on the elevator. The lab is located at the first door on the left as you exit the elevator.  You have been scheduled for an endoscopy and colonoscopy. Please follow the written instructions given to you at your visit today.  If you use inhalers (even only as needed), please bring them with you on the day of your procedure.  DO NOT TAKE 7 DAYS PRIOR TO TEST- Trulicity (dulaglutide) Ozempic, Wegovy (semaglutide) Mounjaro (tirzepatide) Bydureon Bcise (exanatide extended release)  DO NOT TAKE 1 DAY PRIOR TO YOUR TEST Rybelsus (semaglutide) Adlyxin (lixisenatide) Victoza (liraglutide) Byetta (exanatide) ___________________________________________________________________________   It was a pleasure to see you today!  Vito Cirigliano, D.O.

## 2023-07-31 NOTE — Progress Notes (Signed)
 Chief Complaint: Crohn's Disease   Referring Provider:     Karie Georges, MD    HPI:     Sonia [name] is a 38 y.o. female referred to the Gastroenterology Clinic for evaluation of Crohn's disease and to establish care.  Had previously followed with Dr. Russella Dar, last seen 06/18/2020.  For her Crohn's ileocolitis, this was diagnosed in 2008 and initially treated with Pentasa then Lialda.  Symptoms have been mild, and had been off therapy for years.  Did have occasional rectal bleeding when she was seen in the office in 02/2018, and recommended resuming Lialda, but she stayed without therapy.  Hospital admission in 04/2020-05/2020 with acute pancreatitis.  Normal IgG, no autoimmune pancreatitis on imaging. EGD on 1/7 was normal with gastric biopsies showing mild chronic gastritis and duodenal biopsies were normal.  MRCP with subtle inflammatory changes around the pancreas, no other pancreatic abnormalities, hepatic and splenic iron deposition, CT AP was normal without evidence of pancreatitis, RUQ [place] was normal.  No recurrence of pancreatitis since then.  Hx of Endometriosis with surgery in 2012.   Today, she reports having had constipation for years (uses Miralax prn with improvement), then started having BRB with nearly every BM for the last 6 months. Now with mucus-like stools over the last 3 months (has seen this in the past as well). Intermittent mild abdominal pain in the lower abdomen which improves with BM. Does have feeling on incomplete evacuation at times. No n/v, fever. Weight stable.   No abdominal imaging for review since 2022 as outlined above.  Reviewed most recent labs from 11/2022: Normal CBC, CMP.  Ferritin 6.4, iron 21.8.  Was started on oral vitamin D and iron.  No repeat labs since then.   Endoscopic History: - 08/2015: Colonoscopy: few erosions in the terminal ileum (benign mucosa), few erosions at the IC valve (erosion with mildly  inflamed granulation tissue without granulomas), Congested mucosa in the descending colon (path benign). Small Grade I internal hemorrhoids. - 05/2020: EGD (inpatient): Normal esophagus, stomach, duodenum.  Gastric and duodenal biopsies benign - 07/2020: Colonoscopy: Normal TI (path benign), normal colon (path benign).  Small grade 1 internal hemorrhoids   Cousin with Crohns Disease (on biologic therapy) and great uncle with Crohns. No Fhx of Colon CA, pancreatic or liver disease   Past Medical History:  Diagnosis Date   Allergy    Anemia    Anxiety    Asthma    Blood transfusion without reported diagnosis    iron infusion   COVID-19    Crohn's colitis (HCC)    Depression    GERD (gastroesophageal reflux disease)    no meds   Normal labor 03/27/2013   Postpartum care following cesarean delivery (11/9) 03/27/2013     Past Surgical History:  Procedure Laterality Date   BIOPSY  05/24/2020   Procedure: BIOPSY;  Surgeon: Beverley Fiedler, MD;  Location: [facility];  Service: Gastroenterology;;   CERVICAL SPINE SURGERY     CESAREAN SECTION N/A 03/27/2013   Procedure: CESAREAN SECTION;  Surgeon: Lenoard Aden, MD;  Location: [facility];  Service: Obstetrics;  Laterality: N/A;   CESAREAN SECTION N/A 09/04/2014   Procedure: REPEAT CESAREAN SECTION;  Surgeon: Jaymes Graff, MD;  Location: [facility];  Service: Obstetrics;  Laterality: N/A;   ESOPHAGOGASTRODUODENOSCOPY (EGD) WITH PROPOFOL N/A 05/24/2020   Procedure: ESOPHAGOGASTRODUODENOSCOPY (EGD) WITH PROPOFOL;  Surgeon: Beverley Fiedler, MD;  Location: [facility];  Service: Gastroenterology;  Laterality: N/A;   HAND SURGERY  2012   with nerve block   LAPAROSCOPIC ENDOMETRIOSIS FULGURATION     WISDOM TOOTH EXTRACTION     Family History  Problem Relation Age of Onset   COPD Mother    Heart disease Mother        smoker   Hernia Mother    Diverticulitis Mother    Kidney disease Mother    Heart failure Mother    Stroke Mother 28    Osteoporosis Mother    Anxiety disorder Mother        ? on xanax   Rheum arthritis Mother    Heart disease Maternal Grandmother        smoker   Arthritis-Osteo Maternal Grandmother    Cancer Maternal Grandfather        throat   Alcohol abuse Maternal Grandfather    Other Father        pituitary gland tumor   Depression Sister    Anxiety disorder Sister    Crohn's disease Cousin    Colon cancer Neg Hx    Rectal cancer Neg Hx    Stomach cancer Neg Hx    Esophageal cancer Neg Hx    Social History   Tobacco Use   Smoking status: Never   Smokeless tobacco: Former    Types: Engineer, drilling   Vaping status: Never Used  Substance Use Topics   Alcohol use: Yes    Comment: occasionally   Drug use: No   Current Outpatient Medications  Medication Sig Dispense Refill   albuterol (VENTOLIN HFA) 108 (90 Base) MCG/ACT inhaler Inhale 1-2 puffs into the lungs every 4 (four) hours as needed for wheezing or shortness of breath. (Patient taking differently: Inhale 1-2 puffs into the lungs every 4 (four) hours as needed for wheezing or shortness of breath. As needed) 18 g 0   amphetamine-dextroamphetamine (ADDERALL) 10 MG tablet Take 1 tablet (10 mg total) by mouth daily after lunch. 30 tablet 0   budesonide-formoterol (SYMBICORT) 80-4.5 MCG/ACT inhaler Inhale 2 puffs into the lungs 2 (two) times a day. As needed     buPROPion (WELLBUTRIN XL) 150 MG 24 hr tablet Take 1 tablet (150 mg total) by mouth daily. 90 tablet 1   cetirizine (ZYRTEC) 10 MG tablet Take 10 mg by mouth daily.     dicyclomine (BENTYL) 20 MG tablet Take 1 tablet (20 mg total) by mouth 3 (three) times daily. (Patient taking differently: Take 20 mg by mouth as needed.) 90 tablet 0   escitalopram (LEXAPRO) 10 MG tablet Take 1 tablet (10 mg total) by mouth daily. 90 tablet 1   ibuprofen (ADVIL) 800 MG tablet Take 800 mg by mouth 2 (two) times daily.     lisdexamfetamine (VYVANSE) 30 MG capsule Take 1 capsule (30 mg total) by mouth  daily. 30 capsule 0   methocarbamol (ROBAXIN) 500 MG tablet Take 1 tablet (500 mg total) by mouth every 8 (eight) hours. 40 tablet 0   methylPREDNISolone (MEDROL DOSEPAK) 4 MG TBPK tablet Take by mouth as directed 21 each 0   traZODone (DESYREL) 50 MG tablet Take 1 tablet (50 mg total) by mouth at bedtime. 90 tablet 3   triamcinolone cream (KENALOG) 0.1 % Apply 1 Application topically 2 (two) times daily. 30 g 2   No current facility-administered medications for this visit.   Allergies  Allergen Reactions   Other Other (See Comments)  Mold, mildew, grass, fungus, trees cause swelling of eyes     Review of Systems: All systems reviewed and negative except where noted in HPI.     Physical Exam:    Wt Readings from Last 3 Encounters:  07/31/23 173 lb (78.5 kg)  07/10/23 168 lb (76.2 kg)  11/27/22 166 lb 8 oz (75.5 kg)    BP (!) 120/90   Pulse 71   Ht 5\' 2"  (1.575 m)   Wt 173 lb (78.5 kg)   BMI 31.64 kg/m  Constitutional:  Pleasant, in no acute distress. Psychiatric: Normal mood and affect. Behavior is normal. Cardiovascular: Normal rate, regular rhythm. No edema Pulmonary/chest: Effort normal and breath sounds normal. No wheezing, rales or rhonchi. Abdominal: Soft, nondistended, nontender. Bowel sounds active throughout. There are no masses palpable. No hepatomegaly. Neurological: Alert and oriented to person place and time. Skin: Skin is warm and dry. No rashes noted.   ASSESSMENT AND PLAN;   1) Crohn's Disease 2) Iron deficiency anemia 3) Change in bowel habits 4) Hematochezia 5) Mucus-like stools 6) Vitamin D deficiency  38 year old female with history of Crohn's ileocolitis, diagnosed 2008 and initially treated with Pentasa then possibly a short course of Lialda.  No prior steroids, immunomodulators, biologic therapy.  Has otherwise been off any IBD therapy for many years.  Last colonoscopy in 07/2020 was normal without any endoscopic or histologic evidence of  IBD (i.e. deep remission without medication).  She now has bloody stools and mucus-like stools, which she has seen in the past, suspicious for active Crohn's.  Plan for the following:  - EGD with random and directed biopsies - Colonoscopy with random and directed biopsies - Check CBC, CMP, iron panel, vitamin D today - Check ESR, CRP, fecal calprotectin - Check celiac serologies - Plan for MiraLAX prep for colonoscopy per patient preference  The indications, risks, and benefits of EGD and colonoscopy were explained to the patient in detail. Risks include but are not limited to bleeding, perforation, adverse reaction to medications, and cardiopulmonary compromise. Sequelae include but are not limited to the possibility of surgery, hospitalization, and mortality. The patient verbalized understanding and wished to proceed. All questions answered, referred to scheduler and bowel prep ordered. Further recommendations pending results of the exam.     Shellia Cleverly, DO, FACG  07/31/2023, 11:14 AM   Karie Georges, MD

## 2023-08-04 ENCOUNTER — Encounter: Payer: Self-pay | Admitting: Gastroenterology

## 2023-08-04 ENCOUNTER — Other Ambulatory Visit (HOSPITAL_BASED_OUTPATIENT_CLINIC_OR_DEPARTMENT_OTHER): Payer: Self-pay

## 2023-08-04 ENCOUNTER — Telehealth: Payer: Self-pay

## 2023-08-04 ENCOUNTER — Other Ambulatory Visit: Payer: Self-pay

## 2023-08-04 LAB — TISSUE TRANSGLUTAMINASE, IGA: (tTG) Ab, IgA: <1 U/mL

## 2023-08-04 LAB — VITAMIN B1: Vitamin B1 (Thiamine): 11 nmol/L (ref 8–30)

## 2023-08-04 LAB — IGA: Immunoglobulin A: 90 mg/dL (ref 47–310)

## 2023-08-04 MED ORDER — VITAMIN D (ERGOCALCIFEROL) 1.25 MG (50000 UNIT) PO CAPS
50000.0000 [IU] | ORAL_CAPSULE | ORAL | 0 refills | Status: DC
  Filled 2023-08-04: qty 8, 56d supply, fill #0

## 2023-08-04 MED ORDER — FOLIC ACID 1 MG PO TABS: 1.0000 mg | ORAL_TABLET | Freq: Every day | ORAL | Status: DC

## 2023-08-04 MED ORDER — VITAMIN D 50 MCG (2000 UT) PO CAPS: 1.0000 | ORAL_CAPSULE | Freq: Every day | ORAL | Status: DC

## 2023-08-04 NOTE — Addendum Note (Signed)
 Addended by: Marisa Sprinkles on: 08/04/2023 03:23 PM   Modules accepted: Orders

## 2023-08-04 NOTE — Telephone Encounter (Signed)
 Dr. Barron Alvine, patient will be scheduled as soon as possible.  Auth Submission: NO AUTH NEEDED Site of care: Site of care: [facility] Payer: [employer] commercial Medication & CPT/J Code(s) submitted: Feraheme (ferumoxytol) [id number] Route of submission (phone, fax, portal):  Phone # Fax # Auth type: Buy/Bill PB Units/visits requested: 510mg  x 2 doses Reference number:  Approval from: 08/04/23 to 02/04/24

## 2023-08-07 ENCOUNTER — Encounter: Attending: Gastroenterology | Admitting: *Deleted

## 2023-08-07 VITALS — BP 125/81 | HR 59 | Temp 98.3°F | Resp 16

## 2023-08-07 DIAGNOSIS — D509 Iron deficiency anemia, unspecified: Secondary | ICD-10-CM

## 2023-08-07 DIAGNOSIS — D500 Iron deficiency anemia secondary to blood loss (chronic): Secondary | ICD-10-CM

## 2023-08-07 MED ORDER — SODIUM CHLORIDE 0.9 % IV SOLN
510.0000 mg | Freq: Once | INTRAVENOUS | Status: AC
  Administered 2023-08-07: 510 mg via INTRAVENOUS
  Filled 2023-08-07: qty 17

## 2023-08-07 MED ORDER — DIPHENHYDRAMINE HCL 25 MG PO CAPS
25.0000 mg | ORAL_CAPSULE | Freq: Once | ORAL | Status: AC
  Administered 2023-08-07: 25 mg via ORAL

## 2023-08-07 MED ORDER — ACETAMINOPHEN 325 MG PO TABS
650.0000 mg | ORAL_TABLET | Freq: Once | ORAL | Status: AC
  Administered 2023-08-07: 650 mg via ORAL

## 2023-08-07 NOTE — Progress Notes (Signed)
 Diagnosis: Iron Deficiency Anemia  Provider:  Shellia Cleverly, DO   Procedure: IV Infusion  IV Type: Peripheral, IV Location: L Antecubital  Feraheme (Ferumoxytol), Dose: 510 mg  Infusion Start Time: 1433  Infusion Stop Time: 1450  Post Infusion IV Care: Observation period completed and Peripheral IV Discontinued  Discharge: Condition: Good, Destination: Home . AVS Provided  Performed by:  Arrie Senate, RN

## 2023-08-12 DIAGNOSIS — M5412 Radiculopathy, cervical region: Secondary | ICD-10-CM | POA: Diagnosis not present

## 2023-08-14 ENCOUNTER — Ambulatory Visit

## 2023-08-14 ENCOUNTER — Encounter: Admitting: *Deleted

## 2023-08-14 VITALS — BP 105/77 | HR 59 | Temp 98.2°F | Resp 16

## 2023-08-14 DIAGNOSIS — D509 Iron deficiency anemia, unspecified: Secondary | ICD-10-CM

## 2023-08-14 DIAGNOSIS — D500 Iron deficiency anemia secondary to blood loss (chronic): Secondary | ICD-10-CM

## 2023-08-14 MED ORDER — ACETAMINOPHEN 325 MG PO TABS
650.0000 mg | ORAL_TABLET | Freq: Once | ORAL | Status: AC
  Administered 2023-08-14: 650 mg via ORAL

## 2023-08-14 MED ORDER — SODIUM CHLORIDE 0.9 % IV SOLN
510.0000 mg | Freq: Once | INTRAVENOUS | Status: AC
  Administered 2023-08-14: 510 mg via INTRAVENOUS
  Filled 2023-08-14: qty 17

## 2023-08-14 MED ORDER — DIPHENHYDRAMINE HCL 25 MG PO CAPS
25.0000 mg | ORAL_CAPSULE | Freq: Once | ORAL | Status: AC
Start: 2023-08-14 — End: 2023-08-14
  Administered 2023-08-14: 25 mg via ORAL

## 2023-08-14 NOTE — Progress Notes (Signed)
 Diagnosis: Acute Anemia  Provider:  Shellia Cleverly, DO   Procedure: IV Infusion  IV Type: Peripheral, IV Location: R Antecubital  Feraheme (Ferumoxytol), Dose: 510 mg  Infusion Start Time: 1500  Infusion Stop Time: 1516  Post Infusion IV Care: Observation period completed  Discharge: Condition: Good, Destination: Home . AVS Provided  Performed by:  Daleen Squibb, RN

## 2023-08-18 ENCOUNTER — Other Ambulatory Visit

## 2023-08-18 DIAGNOSIS — K5010 Crohn's disease of large intestine without complications: Secondary | ICD-10-CM | POA: Diagnosis not present

## 2023-08-20 ENCOUNTER — Encounter: Payer: Self-pay | Admitting: Gastroenterology

## 2023-08-20 LAB — CALPROTECTIN, FECAL: Calprotectin, Fecal: 332 ug/g — ABNORMAL HIGH (ref 0–120)

## 2023-08-29 ENCOUNTER — Other Ambulatory Visit (HOSPITAL_BASED_OUTPATIENT_CLINIC_OR_DEPARTMENT_OTHER): Payer: Self-pay

## 2023-08-29 ENCOUNTER — Other Ambulatory Visit: Payer: Self-pay | Admitting: Family Medicine

## 2023-08-29 DIAGNOSIS — F50819 Binge eating disorder, unspecified: Secondary | ICD-10-CM

## 2023-08-30 MED ORDER — LISDEXAMFETAMINE DIMESYLATE 30 MG PO CAPS
30.0000 mg | ORAL_CAPSULE | Freq: Every day | ORAL | 0 refills | Status: DC
  Filled 2023-08-30: qty 30, 30d supply, fill #0

## 2023-08-30 MED ORDER — AMPHETAMINE-DEXTROAMPHETAMINE 10 MG PO TABS
10.0000 mg | ORAL_TABLET | Freq: Every day | ORAL | 0 refills | Status: DC
  Filled 2023-08-30: qty 30, 30d supply, fill #0

## 2023-08-31 ENCOUNTER — Other Ambulatory Visit: Payer: Self-pay | Admitting: Gastroenterology

## 2023-08-31 ENCOUNTER — Other Ambulatory Visit (HOSPITAL_BASED_OUTPATIENT_CLINIC_OR_DEPARTMENT_OTHER): Payer: Self-pay

## 2023-09-01 ENCOUNTER — Other Ambulatory Visit (HOSPITAL_BASED_OUTPATIENT_CLINIC_OR_DEPARTMENT_OTHER): Payer: Self-pay

## 2023-09-02 ENCOUNTER — Encounter (HOSPITAL_BASED_OUTPATIENT_CLINIC_OR_DEPARTMENT_OTHER): Payer: Self-pay

## 2023-09-02 ENCOUNTER — Other Ambulatory Visit (HOSPITAL_BASED_OUTPATIENT_CLINIC_OR_DEPARTMENT_OTHER): Payer: Self-pay

## 2023-09-08 ENCOUNTER — Encounter: Payer: Self-pay | Admitting: Gastroenterology

## 2023-09-16 ENCOUNTER — Ambulatory Visit: Admitting: Gastroenterology

## 2023-09-16 ENCOUNTER — Encounter: Payer: Self-pay | Admitting: Gastroenterology

## 2023-09-16 ENCOUNTER — Other Ambulatory Visit (HOSPITAL_BASED_OUTPATIENT_CLINIC_OR_DEPARTMENT_OTHER): Payer: Self-pay

## 2023-09-16 VITALS — BP 124/85 | HR 65 | Temp 98.0°F | Resp 13 | Ht 62.0 in | Wt 173.0 lb

## 2023-09-16 DIAGNOSIS — R195 Other fecal abnormalities: Secondary | ICD-10-CM

## 2023-09-16 DIAGNOSIS — K5010 Crohn's disease of large intestine without complications: Secondary | ICD-10-CM

## 2023-09-16 DIAGNOSIS — K921 Melena: Secondary | ICD-10-CM

## 2023-09-16 DIAGNOSIS — E559 Vitamin D deficiency, unspecified: Secondary | ICD-10-CM

## 2023-09-16 DIAGNOSIS — D500 Iron deficiency anemia secondary to blood loss (chronic): Secondary | ICD-10-CM

## 2023-09-16 DIAGNOSIS — D509 Iron deficiency anemia, unspecified: Secondary | ICD-10-CM | POA: Diagnosis not present

## 2023-09-16 DIAGNOSIS — K5730 Diverticulosis of large intestine without perforation or abscess without bleeding: Secondary | ICD-10-CM | POA: Diagnosis not present

## 2023-09-16 MED ORDER — SODIUM CHLORIDE 0.9 % IV SOLN: 500.0000 mL | Freq: Once | INTRAVENOUS | Status: DC

## 2023-09-16 MED ORDER — MESALAMINE 1000 MG RE SUPP
1000.0000 mg | Freq: Every day | RECTAL | 3 refills | Status: DC
  Filled 2023-09-16: qty 90, 90d supply, fill #0

## 2023-09-16 NOTE — Op Note (Signed)
 [facility] Patient Name: Sonia [name] Procedure Date: 09/16/2023 8:56 AM MRN: [id number] Endoscopist: Harry Lindau , MD, [id number] Age: 38 Referring MD:  Date of Birth: [birth year] Gender: Female Account #: [id number] Procedure:                Colonoscopy Indications:              Hematochezia, Iron deficiency anemia, Change in                            bowel habits                           38 year old female with a history of Crohn's                            ileocolitis diagnosed in 2008, has been off therapy                            for years, presenting with change in bowel habits,                            mucus-like stools, hematochezia with recent                            evaluation notable for iron deficiency anemia,                            folate deficiency, vitamin D  deficiency, elevated                            fecal calprotectin (332), mildly elevated ESR (23),                            with normal CRP. Last colonoscopy in 2022 was                            notable for small internal hemorrhoids, otherwise                            normal. Medicines:                Monitored Anesthesia Care Procedure:                Pre-Anesthesia Assessment:                           - Prior to the procedure, a History and Physical                            was performed, and patient medications and                            allergies were reviewed. The patient's tolerance of                            previous anesthesia was  also reviewed. The risks                            and benefits of the procedure and the sedation                            options and risks were discussed with the patient.                            All questions were answered, and informed consent                            was obtained. Prior Anticoagulants: The patient has                            taken no anticoagulant or antiplatelet agents. ASA                             Grade Assessment: II - A patient with mild systemic                            disease. After reviewing the risks and benefits,                            the patient was deemed in satisfactory condition to                            undergo the procedure.                           After obtaining informed consent, the colonoscope                            was passed under direct vision. Throughout the                            procedure, the patient's blood pressure, pulse, and                            oxygen saturations were monitored continuously. The                            CF HQ190L #[id number] was introduced through the anus                            and advanced to the 10 cm into the ileum. The                            colonoscopy was performed without difficulty. The                            patient tolerated the procedure well. The quality  of the bowel preparation was good. The terminal                            ileum, ileocecal valve, appendiceal orifice, and                            rectum were photographed. Scope In: 9:09:09 AM Scope Out: 9:29:54 AM Total Procedure Duration: 0 hours 20 minutes 45 seconds  Findings:                 The perianal and digital rectal examinations were                            normal.                           Localized mild, subtle inflammation characterized                            by aphthous ulcerations was found in the proximal                            ascending colon. Biopsies were taken with a cold                            forceps for histology. Estimated blood loss was                            minimal.                           Localized mild inflammation characterized by                            congestion (edema), erythema, and aphthous                            ulcerations was found in the distal rectum. This                            was limited to the distal 2-3 cm of the rectum.                             Biopsies were taken with a cold forceps for                            histology. Estimated blood loss was minimal.                           A single small-mouthed diverticulum was found in                            the ascending colon.                           The mucosa was  otherwise normal throughout the                            recto-sigmoid colon, the sigmoid colon, descending                            colon, transverse colon, distal ascending colon,                            and in the cecum. Biopsies were taken with a cold                            forceps for histology. Estimated blood loss was                            minimal.                           The terminal ileum appeared normal. Complications:            No immediate complications. Estimated Blood Loss:     Estimated blood loss was minimal. Impression:               - Localized mild inflammation was found in the                            proximal ascending colon. Biopsied.                           - Localized mild inflammation was found in the                            distal rectum. Biopsied.                           - Diverticulosis in the ascending colon.                           - Normal mucosa in the recto-sigmoid colon, in the                            sigmoid colon, in the descending colon, in the                            transverse colon, in the distal ascending colon and                            in the cecum. Biopsied.                           - The examined portion of the ileum was normal. Recommendation:           - Patient has a contact number available for                            emergencies. The signs and symptoms of  potential                            delayed complications were discussed with the                            patient. Return to normal activities tomorrow.                            Written discharge instructions were provided to the                             patient.                           - Resume previous diet.                           - Use Canasa  1000 mg suppository 1 per rectum QHS.                           - Await pathology results.                           - Return to GI clinic at appointment to be                            scheduled.                           - Will start with Canasa  suppository for treatment                            of the distal colitis noted on colonoscopy today.                            Will discuss role/utility of adding another agent                            such as Lialda  depending on pathology results and                            response to therapy for treatment of the subtle                            inflammatory changes noted in the proximal                            ascending colon. Harry Lindau, MD 09/16/2023 9:45:19 AM

## 2023-09-16 NOTE — Op Note (Signed)
 [facility] Patient Name: Sonia [name] Procedure Date: 09/16/2023 8:58 AM MRN: [id number] Endoscopist: Harry Lindau , MD, [id number] Age: 38 Referring MD:  Date of Birth: 25-Apr-1986 Gender: Female Account #: [id number] Procedure:                Upper GI endoscopy Indications:              Iron deficiency anemia, Hematochezia, Change in                            bowel habits, vitamin D  deficiency, Crohn's Disease Medicines:                Monitored Anesthesia Care Procedure:                Pre-Anesthesia Assessment:                           - Prior to the procedure, a History and Physical                            was performed, and patient medications and                            allergies were reviewed. The patient's tolerance of                            previous anesthesia was also reviewed. The risks                            and benefits of the procedure and the sedation                            options and risks were discussed with the patient.                            All questions were answered, and informed consent                            was obtained. Prior Anticoagulants: The patient has                            taken no anticoagulant or antiplatelet agents. ASA                            Grade Assessment: II - A patient with mild systemic                            disease. After reviewing the risks and benefits,                            the patient was deemed in satisfactory condition to                            undergo the procedure.  After obtaining informed consent, the endoscope was                            passed under direct vision. Throughout the                            procedure, the patient's blood pressure, pulse, and                            oxygen saturations were monitored continuously. The                            GIF HQ190 #[id number] was introduced through the                             mouth, and advanced to the second part of duodenum.                            The upper GI endoscopy was accomplished without                            difficulty. The patient tolerated the procedure                            well. Scope In: Scope Out: Findings:                 The examined esophagus was normal.                           The entire examined stomach was normal. Biopsies                            were taken with a cold forceps for Helicobacter                            pylori testing. Estimated blood loss was minimal.                           The examined duodenum was normal. Biopsies were                            taken with a cold forceps for histology. Estimated                            blood loss was minimal. Complications:            No immediate complications. Estimated Blood Loss:     Estimated blood loss was minimal. Impression:               - Normal esophagus.                           - Normal stomach. Biopsied.                           -  Normal examined duodenum. Biopsied. Recommendation:           - Patient has a contact number available for                            emergencies. The signs and symptoms of potential                            delayed complications were discussed with the                            patient. Return to normal activities tomorrow.                            Written discharge instructions were provided to the                            patient.                           - Resume previous diet.                           - Continue present medications.                           - Await pathology results.                           - Perform a colonoscopy today. Harry Lindau, MD 09/16/2023 9:36:49 AM

## 2023-09-16 NOTE — Progress Notes (Signed)
 Called to room to assist during endoscopic procedure.  Patient ID and intended procedure confirmed with present staff. Received instructions for my participation in the procedure from the performing physician.

## 2023-09-16 NOTE — Progress Notes (Signed)
 Pt's states no medical or surgical changes since previsit or office visit.

## 2023-09-16 NOTE — Patient Instructions (Addendum)
 Educational handout provided to patient related to Diverticulosis  Resume previous diet  Continue present medications  Awaiting pathology results  YOU HAD AN ENDOSCOPIC PROCEDURE TODAY AT THE [facility]:   Refer to the procedure report that was given to you for any specific questions about what was found during the examination.  If the procedure report does not answer your questions, please call your gastroenterologist to clarify.  If you requested that your care partner not be given the details of your procedure findings, then the procedure report has been included in a sealed envelope for you to review at your convenience later.  YOU SHOULD EXPECT: Some feelings of bloating in the abdomen. Passage of more gas than usual.  Walking can help get rid of the air that was put into your GI tract during the procedure and reduce the bloating. If you had a lower endoscopy (such as a colonoscopy or flexible sigmoidoscopy) you may notice spotting of blood in your stool or on the toilet paper. If you underwent a bowel prep for your procedure, you may not have a normal bowel movement for a few days.  Please Note:  You might notice some irritation and congestion in your nose or some drainage.  This is from the oxygen used during your procedure.  There is no need for concern and it should clear up in a day or so.  SYMPTOMS TO REPORT IMMEDIATELY:  Following lower endoscopy (colonoscopy or flexible sigmoidoscopy):  Excessive amounts of blood in the stool  Significant tenderness or worsening of abdominal pains  Swelling of the abdomen that is new, acute  Fever of 100F or higher  Following upper endoscopy (EGD)  Vomiting of blood or coffee ground material  New chest pain or pain under the shoulder blades  Painful or persistently difficult swallowing  New shortness of breath  Fever of 100F or higher  Black, tarry-looking stools  For urgent or emergent issues, a gastroenterologist can be  reached at any hour by calling [phone]. Do not use MyChart messaging for urgent concerns.    DIET:  We do recommend a small meal at first, but then you may proceed to your regular diet.  Drink plenty of fluids but you should avoid alcoholic beverages for 24 hours.  ACTIVITY:  You should plan to take it easy for the rest of today and you should NOT DRIVE or use heavy machinery until tomorrow (because of the sedation medicines used during the test).    FOLLOW UP: Our staff will call the number listed on your records the next business day following your procedure.  We will call around 7:15- 8:00 am to check on you and address any questions or concerns that you may have regarding the information given to you following your procedure. If we do not reach you, we will leave a message.     If any biopsies were taken you will be contacted by phone or by letter within the next 1-3 weeks.  Please call us  at [phone] if you have not heard about the biopsies in 3 weeks.    SIGNATURES/CONFIDENTIALITY: You and/or your care partner have signed paperwork which will be entered into your electronic medical record.  These signatures attest to the fact that that the information above on your After Visit Summary has been reviewed and is understood.  Full responsibility of the confidentiality of this discharge information lies with you and/or your care-partner.

## 2023-09-16 NOTE — Progress Notes (Signed)
 Sedate, gd SR, tolerated procedure well, VSS, report to RN

## 2023-09-16 NOTE — Progress Notes (Signed)
 GASTROENTEROLOGY PROCEDURE H&P NOTE   Primary Care Physician: Aida House, MD    Reason for Procedure:  Crohn's disease, change in bowel habits, hematochezia, abdominal pain, iron deficiency anemia, elevated calprotectin, folate deficiency, vitamin D  deficiency  Plan:    EGD, colonoscopy  Patient is appropriate for endoscopic procedure(s) in the ambulatory ([facility]) setting.  The nature of the procedure, as well as the risks, benefits, and alternatives were carefully and thoroughly reviewed with the patient. Ample time for discussion and questions allowed. The patient understood, was satisfied, and agreed to proceed.     HPI: Sonia [name] is a 38 y.o. female who presents for EGD and colonoscopy for evaluation of iron deficiency anemia, Crohn's disease, change in bowel habits, hematochezia, mucus-like stools, vitamin D  deficiency.  Has been off IBD therapy for many years.  Last colonoscopy 07/2020 without any active IBD, but more recently has had changes in bowel habits with mucus-like stools and bloody stools.  Labs notable for her deficiency anemia with ferritin 4.6, iron 77, TIBC 278, sat 19.8% with H/H 11.6/35.  Folate 5.0, B12 normal.  Vitamin D  16.6.  Was started on IV iron and ergocalciferol  and folic acid .  Otherwise normal CMP.  Mildly elevated ESR 23, otherwise normal CRP.  Normal thiamine, IgA, TTG.  Calprotectin elevated 332.  Past Medical History:  Diagnosis Date   Allergy    Anemia    Anxiety    Asthma    Blood transfusion without reported diagnosis    iron infusion   COVID-19    Crohn's colitis (HCC)    Depression    GERD (gastroesophageal reflux disease)    no meds   Normal labor 03/27/2013   Postpartum care following cesarean delivery (11/9) 03/27/2013    Past Surgical History:  Procedure Laterality Date   BIOPSY  05/24/2020   Procedure: BIOPSY;  Surgeon: Nannette Babe, MD;  Location: [facility];  Service: Gastroenterology;;    CERVICAL SPINE SURGERY     CESAREAN SECTION N/A 03/27/2013   Procedure: CESAREAN SECTION;  Surgeon: Camillo Celestine, MD;  Location: [facility];  Service: Obstetrics;  Laterality: N/A;   CESAREAN SECTION N/A 09/04/2014   Procedure: REPEAT CESAREAN SECTION;  Surgeon: Marylu Soda, MD;  Location: [facility];  Service: Obstetrics;  Laterality: N/A;   ESOPHAGOGASTRODUODENOSCOPY (EGD) WITH PROPOFOL  N/A 05/24/2020   Procedure: ESOPHAGOGASTRODUODENOSCOPY (EGD) WITH PROPOFOL ;  Surgeon: Nannette Babe, MD;  Location[facility] END[facility]COPY;  Service: Gastroenterology;  Laterality: N/A;   HAND SURGERY  2012   with nerve block   LAPAROSCOPIC ENDOMETRIOSIS FULGURATION     WISDOM TOOTH EXTRACTION      Prior to Admission medications   Medication Sig Start Date End Date Taking? Authorizing Provider  albuterol  (VENTOLIN  HFA) 108 (90 Base) MCG/ACT inhaler Inhale 1-2 puffs into the lungs every 4 (four) hours as needed for wheezing or shortness of breath. Patient taking differently: Inhale 1-2 puffs into the lungs every 4 (four) hours as needed for wheezing or shortness of breath. As needed 08/26/19   Adolph Hoop, PA-C  amphetamine -dextroamphetamine  (ADDERALL) 10 MG tablet Take 1 tablet (10 mg total) by mouth daily after lunch. 08/30/23   Aida House, MD  budesonide-formoterol [facility]) 80-4.5 MCG/ACT inhaler Inhale 2 puffs into the lungs 2 (two) times a day. As needed    [provider]  buPROPion  (WELLBUTRIN  XL) 150 MG 24 hr tablet Take 1 tablet (150 mg total) by mouth daily. Patient not taking: Reported on 09/16/2023 06/24/22  Aida House, MD  cetirizine  (ZYRTEC ) 10 MG tablet Take 10 mg by mouth daily.    [provider]  Cholecalciferol (VITAMIN D ) 50 MCG (2000 UT) CAPS Take 1 capsule (2,000 Units total) by mouth daily. Following High Dose Vitamin D  08/04/23   Lorayne Getchell V, DO  dicyclomine  (BENTYL ) 20 MG tablet Take 1 tablet (20 mg total) by mouth 3 (three) times daily. Patient taking  differently: Take 20 mg by mouth as needed. 05/24/20   Swayze, Ava, DO  diphenhydrAMINE  (BENADRYL  ALLERGY) 25 mg capsule     [provider]  escitalopram  (LEXAPRO ) 10 MG tablet Take 1 tablet (10 mg total) by mouth daily. 02/19/23   Aida House, MD  fluticasone [facility] ALLERGY RELIEF) 50 MCG/ACT nasal spray     [provider]  folic acid  (FOLVITE ) 1 MG tablet Take 1 tablet (1 mg total) by mouth daily. Patient not taking: Reported on 09/16/2023 08/04/23   Eisha Chatterjee V, DO  ibuprofen  (ADVIL ) 800 MG tablet Take 800 mg by mouth 2 (two) times daily.    [provider]  lisdexamfetamine (VYVANSE ) 30 MG capsule Take 1 capsule (30 mg total) by mouth daily. 08/30/23   Aida House, MD  methocarbamol  (ROBAXIN ) 500 MG tablet Take 1 tablet (500 mg total) by mouth every 8 (eight) hours. Patient not taking: Reported on 09/16/2023 05/28/23     methylPREDNISolone  (MEDROL  DOSEPAK) 4 MG TBPK tablet Take by mouth as directed Patient not taking: Reported on 09/16/2023 07/10/23   Aida House, MD  naphazoline-pheniramine (NAPHCON-A) 0.025-0.3 % ophthalmic solution 1 drop into affected eye  as needed Ophthalmic Four times a day    [provider]  traZODone  (DESYREL ) 50 MG tablet Take 1 tablet (50 mg total) by mouth at bedtime. Patient not taking: Reported on 09/16/2023 06/24/22   Aida House, MD  triamcinolone  cream (KENALOG ) 0.1 % Apply 1 Application topically 2 (two) times daily. 11/27/22   Aida House, MD  Vitamin D , Ergocalciferol , (DRISDOL ) 1.25 MG (50000 UNIT) CAPS capsule Take 1 capsule (50,000 Units total) by mouth once a week. For 8 weeks 08/04/23   Findley Blankenbaker V, DO  Norgestimate-Eth Estradiol  (SPRINTEC 28 PO) Take by mouth.  09/14/19  [provider]    Current Outpatient Medications  Medication Sig Dispense Refill   albuterol  (VENTOLIN  HFA) 108 (90 Base) MCG/ACT inhaler Inhale 1-2 puffs into the lungs every 4 (four) hours as  needed for wheezing or shortness of breath. (Patient taking differently: Inhale 1-2 puffs into the lungs every 4 (four) hours as needed for wheezing or shortness of breath. As needed) 18 g 0   amphetamine -dextroamphetamine  (ADDERALL) 10 MG tablet Take 1 tablet (10 mg total) by mouth daily after lunch. 30 tablet 0   budesonide-formoterol (SYMBICORT) 80-4.5 MCG/ACT inhaler Inhale 2 puffs into the lungs 2 (two) times a day. As needed     buPROPion  (WELLBUTRIN  XL) 150 MG 24 hr tablet Take 1 tablet (150 mg total) by mouth daily. (Patient not taking: Reported on 09/16/2023) 90 tablet 1   cetirizine  (ZYRTEC ) 10 MG tablet Take 10 mg by mouth daily.     Cholecalciferol (VITAMIN D ) 50 MCG (2000 UT) CAPS Take 1 capsule (2,000 Units total) by mouth daily. Following High Dose Vitamin D      dicyclomine  (BENTYL ) 20 MG tablet Take 1 tablet (20 mg total) by mouth 3 (three) times daily. (Patient taking differently: Take 20 mg by mouth as needed.) 90 tablet 0  diphenhydrAMINE  (BENADRYL  ALLERGY) 25 mg capsule      escitalopram  (LEXAPRO ) 10 MG tablet Take 1 tablet (10 mg total) by mouth daily. 90 tablet 1   fluticasone (FLONASE ALLERGY RELIEF) 50 MCG/ACT nasal spray      folic acid  (FOLVITE ) 1 MG tablet Take 1 tablet (1 mg total) by mouth daily. (Patient not taking: Reported on 09/16/2023)     ibuprofen  (ADVIL ) 800 MG tablet Take 800 mg by mouth 2 (two) times daily.     lisdexamfetamine (VYVANSE ) 30 MG capsule Take 1 capsule (30 mg total) by mouth daily. 30 capsule 0   methocarbamol  (ROBAXIN ) 500 MG tablet Take 1 tablet (500 mg total) by mouth every 8 (eight) hours. (Patient not taking: Reported on 09/16/2023) 40 tablet 0   methylPREDNISolone  (MEDROL  DOSEPAK) 4 MG TBPK tablet Take by mouth as directed (Patient not taking: Reported on 09/16/2023) 21 each 0   naphazoline-pheniramine (NAPHCON-A) 0.025-0.3 % ophthalmic solution 1 drop into affected eye  as needed Ophthalmic Four times a day     traZODone  (DESYREL ) 50 MG tablet  Take 1 tablet (50 mg total) by mouth at bedtime. (Patient not taking: Reported on 09/16/2023) 90 tablet 3   triamcinolone  cream (KENALOG ) 0.1 % Apply 1 Application topically 2 (two) times daily. 30 g 2   Vitamin D , Ergocalciferol , (DRISDOL ) 1.25 MG (50000 UNIT) CAPS capsule Take 1 capsule (50,000 Units total) by mouth once a week. For 8 weeks 8 capsule 0   Current Facility-Administered Medications  Medication Dose Route Frequency Provider Last Rate Last Admin   0.9 %  sodium chloride  infusion  500 mL Intravenous Once Tykeisha Peer V, DO        Allergies as of 09/16/2023 - Review Complete 09/16/2023  Allergen Reaction Noted   Other Other (See Comments) 02/11/2019    Family History  Problem Relation Age of Onset   COPD Mother    Heart disease Mother        smoker   Hernia Mother    Diverticulitis Mother    Kidney disease Mother    Heart failure Mother    Stroke Mother 12   Osteoporosis Mother    Anxiety disorder Mother        ? on xanax   Rheum arthritis Mother    Other Father        pituitary gland tumor   Depression Sister    Anxiety disorder Sister    Heart disease Maternal Grandmother        smoker   Arthritis-Osteo Maternal Grandmother    Cancer Maternal Grandfather        throat   Alcohol abuse Maternal Grandfather    Crohn's disease Cousin    Colon cancer Neg Hx    Rectal cancer Neg Hx    Stomach cancer Neg Hx    Esophageal cancer Neg Hx    Colon polyps Neg Hx     Social History   Socioeconomic History   Marital status: Married    Spouse name: Not on file   Number of children: 2   Years of education: Not on file   Highest education level: Associate degree: occupational, Scientist, product/process development, or vocational program  Occupational History   Occupation: stay at home mother  Tobacco Use   Smoking status: Never   Smokeless tobacco: Former    Types: Engineer, drilling   Vaping status: Never Used  Substance and Sexual Activity   Alcohol use: Yes    Comment:  occasionally  Drug use: No   Sexual activity: Yes    Birth control/protection: None  Other Topics Concern   Not on file  Social History Narrative   Not on file   Social Drivers of Health   Financial Resource Strain: Medium Risk (06/24/2022)   Overall Financial Resource Strain (CARDIA)    Difficulty of Paying Living Expenses: Somewhat hard  Food Insecurity: No Food Insecurity (06/24/2022)   Hunger Vital Sign    Worried About Running Out of Food in the Last Year: Never true    Ran Out of Food in the Last Year: Never true  Transportation Needs: No Transportation Needs (06/24/2022)   PRAPARE - Administrator, Civil Service (Medical): No    Lack of Transportation (Non-Medical): No  Physical Activity: Insufficiently Active (06/24/2022)   Exercise Vital Sign    Days of Exercise per Week: 1 day    Minutes of Exercise per Session: 20 min  Stress: Stress Concern Present (06/24/2022)   [employer] of Occupational Health - Occupational Stress Questionnaire    Feeling of Stress : Rather much  Social Connections: Moderately Integrated (06/24/2022)   Social Connection and Isolation Panel [NHANES]    Frequency of Communication with Friends and Family: More than three times a week    Frequency of Social Gatherings with Friends and Family: Twice a week    Attends Religious Services: More than 4 times per year    Active Member of [employer] or Organizations: No    Attends Engineer, structural: Not on file    Marital Status: Married  Catering manager Violence: Not on file    Physical Exam: Vital signs in last 24 hours: @BP  116/87   Pulse 75   Temp 98 F (36.7 C)   Ht 5\' 2"  (1.575 m)   Wt 173 lb (78.5 kg)   SpO2 100%   BMI 31.64 kg/m  GEN: NAD EYE: Sclerae anicteric ENT: MMM CV: Non-tachycardic Pulm: CTA b/l GI: Soft, NT/ND NEURO:  Alert & Oriented x 3   Harry Lindau, DO [facility]   09/16/2023 8:44 AM

## 2023-09-17 ENCOUNTER — Telehealth: Payer: Self-pay | Admitting: *Deleted

## 2023-09-17 NOTE — Telephone Encounter (Signed)
  Follow up Call-     09/16/2023    8:25 AM  Call back number  Post procedure Call Back phone  # [phone]  Permission to leave phone message Yes     Patient questions:  Do you have a fever, pain , or abdominal swelling? No. Pain Score  0 *  Have you tolerated food without any problems? Yes.    Have you been able to return to your normal activities? Yes.    Do you have any questions about your discharge instructions: Diet   No. Medications  No. Follow up visit  No.  Do you have questions or concerns about your Care? No.  Actions: * If pain score is 4 or above: No action needed, pain <4.

## 2023-09-21 LAB — SURGICAL PATHOLOGY

## 2023-09-26 ENCOUNTER — Encounter: Payer: Self-pay | Admitting: Gastroenterology

## 2023-09-30 ENCOUNTER — Other Ambulatory Visit: Payer: Self-pay | Admitting: Family Medicine

## 2023-09-30 ENCOUNTER — Other Ambulatory Visit (HOSPITAL_BASED_OUTPATIENT_CLINIC_OR_DEPARTMENT_OTHER): Payer: Self-pay

## 2023-09-30 DIAGNOSIS — F50819 Binge eating disorder, unspecified: Secondary | ICD-10-CM

## 2023-09-30 MED ORDER — LISDEXAMFETAMINE DIMESYLATE 30 MG PO CAPS
30.0000 mg | ORAL_CAPSULE | Freq: Every day | ORAL | 0 refills | Status: DC
  Filled 2023-09-30: qty 30, 30d supply, fill #0

## 2023-09-30 MED ORDER — AMPHETAMINE-DEXTROAMPHETAMINE 10 MG PO TABS
10.0000 mg | ORAL_TABLET | Freq: Every day | ORAL | 0 refills | Status: DC
  Filled 2023-09-30: qty 30, 30d supply, fill #0

## 2023-10-01 ENCOUNTER — Encounter: Payer: Self-pay | Admitting: Gastroenterology

## 2023-10-01 ENCOUNTER — Other Ambulatory Visit: Payer: Self-pay

## 2023-10-01 DIAGNOSIS — K5010 Crohn's disease of large intestine without complications: Secondary | ICD-10-CM

## 2023-10-01 DIAGNOSIS — K5900 Constipation, unspecified: Secondary | ICD-10-CM

## 2023-10-07 ENCOUNTER — Ambulatory Visit: Admitting: Gastroenterology

## 2023-10-16 ENCOUNTER — Other Ambulatory Visit (HOSPITAL_BASED_OUTPATIENT_CLINIC_OR_DEPARTMENT_OTHER): Payer: Self-pay

## 2023-10-16 DIAGNOSIS — N3000 Acute cystitis without hematuria: Secondary | ICD-10-CM | POA: Diagnosis not present

## 2023-10-16 MED ORDER — NITROFURANTOIN MONOHYD MACRO 100 MG PO CAPS
100.0000 mg | ORAL_CAPSULE | Freq: Two times a day (BID) | ORAL | 0 refills | Status: DC
  Filled 2023-10-16: qty 10, 5d supply, fill #0

## 2023-10-30 ENCOUNTER — Other Ambulatory Visit (HOSPITAL_BASED_OUTPATIENT_CLINIC_OR_DEPARTMENT_OTHER): Payer: Self-pay

## 2023-10-30 ENCOUNTER — Other Ambulatory Visit: Payer: Self-pay | Admitting: Family Medicine

## 2023-10-30 DIAGNOSIS — F50819 Binge eating disorder, unspecified: Secondary | ICD-10-CM

## 2023-10-30 MED ORDER — LISDEXAMFETAMINE DIMESYLATE 30 MG PO CAPS
30.0000 mg | ORAL_CAPSULE | Freq: Every day | ORAL | 0 refills | Status: DC
  Filled 2023-10-30: qty 30, 30d supply, fill #0

## 2023-10-30 MED ORDER — AMPHETAMINE-DEXTROAMPHETAMINE 10 MG PO TABS
10.0000 mg | ORAL_TABLET | Freq: Every day | ORAL | 0 refills | Status: DC
  Filled 2023-10-30: qty 30, 30d supply, fill #0

## 2023-10-31 ENCOUNTER — Other Ambulatory Visit: Payer: Self-pay | Admitting: Family Medicine

## 2023-10-31 DIAGNOSIS — F419 Anxiety disorder, unspecified: Secondary | ICD-10-CM

## 2023-11-04 ENCOUNTER — Encounter: Payer: Self-pay | Admitting: Gastroenterology

## 2023-11-04 ENCOUNTER — Encounter (HOSPITAL_COMMUNITY): Payer: Self-pay | Admitting: Hematology

## 2023-11-04 ENCOUNTER — Telehealth: Payer: Self-pay

## 2023-11-04 ENCOUNTER — Other Ambulatory Visit (HOSPITAL_BASED_OUTPATIENT_CLINIC_OR_DEPARTMENT_OTHER): Payer: Self-pay

## 2023-11-04 DIAGNOSIS — R300 Dysuria: Secondary | ICD-10-CM | POA: Diagnosis not present

## 2023-11-04 DIAGNOSIS — E559 Vitamin D deficiency, unspecified: Secondary | ICD-10-CM

## 2023-11-04 DIAGNOSIS — R82998 Other abnormal findings in urine: Secondary | ICD-10-CM | POA: Diagnosis not present

## 2023-11-04 DIAGNOSIS — D500 Iron deficiency anemia secondary to blood loss (chronic): Secondary | ICD-10-CM

## 2023-11-04 MED ORDER — CEFDINIR 300 MG PO CAPS
300.0000 mg | ORAL_CAPSULE | Freq: Two times a day (BID) | ORAL | 0 refills | Status: AC
  Filled 2023-11-04: qty 14, 7d supply, fill #0

## 2023-11-04 MED ORDER — PHENAZOPYRIDINE HCL 200 MG PO TABS
200.0000 mg | ORAL_TABLET | Freq: Two times a day (BID) | ORAL | 0 refills | Status: DC | PRN
  Filled 2023-11-04: qty 4, 2d supply, fill #0

## 2023-11-04 NOTE — Telephone Encounter (Signed)
Lab orders in epic. MyChart message sent to patient with lab reminder.

## 2023-11-04 NOTE — Telephone Encounter (Signed)
-----   Message from Nurse Boyd B sent at 08/04/2023  3:19 PM EDT ----- Regarding: Labs Iron panel, vitamin D , folate, and CBC - need to order Dr. Karene Oto

## 2023-11-05 ENCOUNTER — Other Ambulatory Visit (HOSPITAL_BASED_OUTPATIENT_CLINIC_OR_DEPARTMENT_OTHER): Payer: Self-pay

## 2023-11-11 ENCOUNTER — Other Ambulatory Visit (INDEPENDENT_AMBULATORY_CARE_PROVIDER_SITE_OTHER)

## 2023-11-11 ENCOUNTER — Ambulatory Visit: Payer: Self-pay | Admitting: Gastroenterology

## 2023-11-11 DIAGNOSIS — E559 Vitamin D deficiency, unspecified: Secondary | ICD-10-CM

## 2023-11-11 DIAGNOSIS — D500 Iron deficiency anemia secondary to blood loss (chronic): Secondary | ICD-10-CM

## 2023-11-11 DIAGNOSIS — K5010 Crohn's disease of large intestine without complications: Secondary | ICD-10-CM

## 2023-11-11 DIAGNOSIS — K921 Melena: Secondary | ICD-10-CM

## 2023-11-11 LAB — CBC WITH DIFFERENTIAL/PLATELET
Basophils Absolute: 0.1 10*3/uL (ref 0.0–0.1)
Basophils Relative: 0.9 % (ref 0.0–3.0)
Eosinophils Absolute: 0.2 10*3/uL (ref 0.0–0.7)
Eosinophils Relative: 4.3 % (ref 0.0–5.0)
HCT: 40.5 % (ref 36.0–46.0)
Hemoglobin: 13.4 g/dL (ref 12.0–15.0)
Lymphocytes Relative: 20 % (ref 12.0–46.0)
Lymphs Abs: 1.1 10*3/uL (ref 0.7–4.0)
MCHC: 33.2 g/dL (ref 30.0–36.0)
MCV: 87.1 fl (ref 78.0–100.0)
Monocytes Absolute: 0.4 10*3/uL (ref 0.1–1.0)
Monocytes Relative: 7.4 % (ref 3.0–12.0)
Neutro Abs: 3.9 10*3/uL (ref 1.4–7.7)
Neutrophils Relative %: 67.4 % (ref 43.0–77.0)
Platelets: 347 10*3/uL (ref 150.0–400.0)
RBC: 4.65 Mil/uL (ref 3.87–5.11)
RDW: 14.6 % (ref 11.5–15.5)
WBC: 5.8 10*3/uL (ref 4.0–10.5)

## 2023-11-11 LAB — IBC + FERRITIN
Ferritin: 24.1 ng/mL (ref 10.0–291.0)
Iron: 62 ug/dL (ref 42–145)
Saturation Ratios: 18.5 % — ABNORMAL LOW (ref 20.0–50.0)
TIBC: 334.6 ug/dL (ref 250.0–450.0)
Transferrin: 239 mg/dL (ref 212.0–360.0)

## 2023-11-11 LAB — FOLATE: Folate: 6.9 ng/mL (ref >5.9)

## 2023-11-11 LAB — VITAMIN D 25 HYDROXY (VIT D DEFICIENCY, FRACTURES): VITD: 20.96 ng/mL — ABNORMAL LOW (ref 30.00–100.00)

## 2023-11-16 ENCOUNTER — Encounter: Payer: Self-pay | Admitting: Gastroenterology

## 2023-11-16 ENCOUNTER — Other Ambulatory Visit (HOSPITAL_BASED_OUTPATIENT_CLINIC_OR_DEPARTMENT_OTHER): Payer: Self-pay

## 2023-11-16 ENCOUNTER — Encounter (HOSPITAL_COMMUNITY): Payer: Self-pay | Admitting: Hematology

## 2023-11-16 MED ORDER — HYDROCORTISONE ACETATE 25 MG RE SUPP
RECTAL | 1 refills | Status: DC
  Filled 2023-11-16: qty 24, 12d supply, fill #0

## 2023-11-26 ENCOUNTER — Other Ambulatory Visit (HOSPITAL_BASED_OUTPATIENT_CLINIC_OR_DEPARTMENT_OTHER): Payer: Self-pay

## 2023-11-26 ENCOUNTER — Other Ambulatory Visit: Payer: Self-pay | Admitting: Family Medicine

## 2023-11-26 DIAGNOSIS — F50819 Binge eating disorder, unspecified: Secondary | ICD-10-CM

## 2023-11-26 MED ORDER — AMPHETAMINE-DEXTROAMPHETAMINE 10 MG PO TABS
10.0000 mg | ORAL_TABLET | Freq: Every day | ORAL | 0 refills | Status: DC
Start: 2023-11-26 — End: 2023-12-27
  Filled 2023-11-26 – 2023-11-27 (×2): qty 30, 30d supply, fill #0

## 2023-11-26 MED ORDER — LISDEXAMFETAMINE DIMESYLATE 30 MG PO CAPS
30.0000 mg | ORAL_CAPSULE | Freq: Every day | ORAL | 0 refills | Status: DC
  Filled 2023-11-26 – 2023-11-27 (×2): qty 30, 30d supply, fill #0

## 2023-11-27 ENCOUNTER — Other Ambulatory Visit: Payer: Self-pay

## 2023-11-27 ENCOUNTER — Other Ambulatory Visit (HOSPITAL_BASED_OUTPATIENT_CLINIC_OR_DEPARTMENT_OTHER): Payer: Self-pay

## 2023-12-01 ENCOUNTER — Other Ambulatory Visit (INDEPENDENT_AMBULATORY_CARE_PROVIDER_SITE_OTHER)

## 2023-12-01 ENCOUNTER — Ambulatory Visit: Payer: Self-pay | Admitting: Gastroenterology

## 2023-12-01 DIAGNOSIS — K921 Melena: Secondary | ICD-10-CM | POA: Diagnosis not present

## 2023-12-01 DIAGNOSIS — E559 Vitamin D deficiency, unspecified: Secondary | ICD-10-CM | POA: Diagnosis not present

## 2023-12-01 DIAGNOSIS — K5010 Crohn's disease of large intestine without complications: Secondary | ICD-10-CM

## 2023-12-01 DIAGNOSIS — D500 Iron deficiency anemia secondary to blood loss (chronic): Secondary | ICD-10-CM | POA: Diagnosis not present

## 2023-12-01 LAB — SEDIMENTATION RATE: Sed Rate: 14 mm/h (ref 0–20)

## 2023-12-02 ENCOUNTER — Other Ambulatory Visit (HOSPITAL_BASED_OUTPATIENT_CLINIC_OR_DEPARTMENT_OTHER): Payer: Self-pay

## 2023-12-02 DIAGNOSIS — N926 Irregular menstruation, unspecified: Secondary | ICD-10-CM | POA: Diagnosis not present

## 2023-12-02 DIAGNOSIS — B3731 Acute candidiasis of vulva and vagina: Secondary | ICD-10-CM | POA: Diagnosis not present

## 2023-12-02 DIAGNOSIS — R350 Frequency of micturition: Secondary | ICD-10-CM | POA: Diagnosis not present

## 2023-12-02 MED ORDER — FLUCONAZOLE 150 MG PO TABS
150.0000 mg | ORAL_TABLET | Freq: Every day | ORAL | 0 refills | Status: DC
  Filled 2023-12-02: qty 3, 3d supply, fill #0

## 2023-12-11 DIAGNOSIS — N926 Irregular menstruation, unspecified: Secondary | ICD-10-CM | POA: Diagnosis not present

## 2023-12-11 DIAGNOSIS — D649 Anemia, unspecified: Secondary | ICD-10-CM | POA: Diagnosis not present

## 2023-12-16 ENCOUNTER — Other Ambulatory Visit

## 2023-12-16 DIAGNOSIS — E559 Vitamin D deficiency, unspecified: Secondary | ICD-10-CM

## 2023-12-16 DIAGNOSIS — K5010 Crohn's disease of large intestine without complications: Secondary | ICD-10-CM

## 2023-12-16 DIAGNOSIS — K921 Melena: Secondary | ICD-10-CM | POA: Diagnosis not present

## 2023-12-16 DIAGNOSIS — D500 Iron deficiency anemia secondary to blood loss (chronic): Secondary | ICD-10-CM | POA: Diagnosis not present

## 2023-12-22 ENCOUNTER — Ambulatory Visit: Admitting: Gastroenterology

## 2023-12-22 ENCOUNTER — Telehealth: Payer: Self-pay

## 2023-12-22 ENCOUNTER — Encounter: Payer: Self-pay | Admitting: Gastroenterology

## 2023-12-22 ENCOUNTER — Other Ambulatory Visit (INDEPENDENT_AMBULATORY_CARE_PROVIDER_SITE_OTHER)

## 2023-12-22 VITALS — BP 110/70 | HR 68 | Ht 62.0 in | Wt 176.0 lb

## 2023-12-22 DIAGNOSIS — K5010 Crohn's disease of large intestine without complications: Secondary | ICD-10-CM | POA: Diagnosis not present

## 2023-12-22 DIAGNOSIS — D500 Iron deficiency anemia secondary to blood loss (chronic): Secondary | ICD-10-CM

## 2023-12-22 DIAGNOSIS — E559 Vitamin D deficiency, unspecified: Secondary | ICD-10-CM

## 2023-12-22 DIAGNOSIS — K5080 Crohn's disease of both small and large intestine without complications: Secondary | ICD-10-CM

## 2023-12-22 DIAGNOSIS — K921 Melena: Secondary | ICD-10-CM

## 2023-12-22 DIAGNOSIS — R194 Change in bowel habit: Secondary | ICD-10-CM | POA: Diagnosis not present

## 2023-12-22 DIAGNOSIS — Z7189 Other specified counseling: Secondary | ICD-10-CM

## 2023-12-22 DIAGNOSIS — D509 Iron deficiency anemia, unspecified: Secondary | ICD-10-CM

## 2023-12-22 DIAGNOSIS — E538 Deficiency of other specified B group vitamins: Secondary | ICD-10-CM

## 2023-12-22 DIAGNOSIS — R195 Other fecal abnormalities: Secondary | ICD-10-CM

## 2023-12-22 LAB — CBC WITH DIFFERENTIAL/PLATELET
Basophils Absolute: 0.1 K/uL (ref 0.0–0.1)
Basophils Relative: 1.2 % (ref 0.0–3.0)
Eosinophils Absolute: 0.4 K/uL (ref 0.0–0.7)
Eosinophils Relative: 7.5 % — ABNORMAL HIGH (ref 0.0–5.0)
HCT: 39.5 % (ref 36.0–46.0)
Hemoglobin: 13.1 g/dL (ref 12.0–15.0)
Lymphocytes Relative: 20.9 % (ref 12.0–46.0)
Lymphs Abs: 1.1 K/uL (ref 0.7–4.0)
MCHC: 33.2 g/dL (ref 30.0–36.0)
MCV: 87.3 fl (ref 78.0–100.0)
Monocytes Absolute: 0.4 K/uL (ref 0.1–1.0)
Monocytes Relative: 7.9 % (ref 3.0–12.0)
Neutro Abs: 3.2 K/uL (ref 1.4–7.7)
Neutrophils Relative %: 62.5 % (ref 43.0–77.0)
Platelets: 347 K/uL (ref 150.0–400.0)
RBC: 4.53 Mil/uL (ref 3.87–5.11)
RDW: 12.9 % (ref 11.5–15.5)
WBC: 5.2 K/uL (ref 4.0–10.5)

## 2023-12-22 LAB — IBC PANEL
Iron: 87 ug/dL (ref 42–145)
Saturation Ratios: 25.9 % (ref 20.0–50.0)
TIBC: 336 ug/dL (ref 250.0–450.0)
Transferrin: 240 mg/dL (ref 212.0–360.0)

## 2023-12-22 LAB — FOLATE: Folate: 6.8 ng/mL (ref >5.9)

## 2023-12-22 LAB — CALPROTECTIN: Calprotectin: 1620 ug/g — ABNORMAL HIGH

## 2023-12-22 NOTE — Telephone Encounter (Signed)
 Dr. San would like to initiate PA for Remicade 5 mg/ kg induction at week 0,2,6, and maintenance every 8 weeks. Patient has requested to have infusions at [facility] at [facility] in [place].

## 2023-12-22 NOTE — Patient Instructions (Addendum)
 _______________________________________________________  If your blood pressure at your visit was 140/90 or greater, please contact your primary care physician to follow up on this.  If you are age 39 or younger, your body mass index should be between 19-25. Your Body mass index is 32.19 kg/m. If this is out of the aformentioned range listed, please consider follow up with your Primary Care Provider.  ________________________________________________________  The [place] [facility] providers would like to encourage you to use MYCHART to communicate with providers for non-urgent requests or questions.  Due to long hold times on the telephone, sending your provider a message by [facility] may be a faster and more efficient way to get a response.  Please allow 48 business hours for a response.  Please remember that this is for non-urgent requests.  _______________________________________________________  [facility] is using a team-based approach to care.  Your team is made up of your doctor and two to three APPS. Our APPS (Nurse Practitioners and Physician Assistants) work with your physician to ensure care continuity for you. They are fully qualified to address your health concerns and develop a treatment plan. They communicate directly with your gastroenterologist to care for you. Seeing the Advanced Practice Practitioners on your physician's team can help you by facilitating care more promptly, often allowing for earlier appointments, access to diagnostic testing, procedures, and other specialty referrals.   Your provider has requested that you go to the basement level for lab work before leaving today. Press B on the elevator. The lab is located at the first door on the left as you exit the elevator.  We will continue to wait for Remicade approval.  Due to recent changes in healthcare laws, you may see the results of your imaging and laboratory studies on MyChart before your provider has had a  chance to review them.  We understand that in some cases there may be results that are confusing or concerning to you. Not all laboratory results come back in the same time frame and the provider may be waiting for multiple results in order to interpret others.  Please give us  48 hours in order for your provider to thoroughly review all the results before contacting the office for clarification of your results.   It was a pleasure to see you today!  Vito Cirigliano, D.O.

## 2023-12-22 NOTE — Telephone Encounter (Signed)
 Remicade orders in epic. PA will be obtained by infusion center. SOC will be [employer] per Luke Bend , CPhT

## 2023-12-22 NOTE — Progress Notes (Unsigned)
 Chief Complaint:    Crohn's disease  GI History: 38 year old female with history of Crohn's ileocolitis, diagnosed in 2008  and initially treated with Pentasa  then Lialda .  Symptoms have been mild, and had been off therapy for years.  Did have occasional rectal bleeding when she was seen in the office in 02/2018, and recommended resuming Lialda , but she stayed without therapy.   Hospital admission in 04/2020-05/2020 with acute pancreatitis.  Normal IgG, no autoimmune pancreatitis on imaging. EGD on 1/7 was normal with gastric biopsies showing mild chronic gastritis and duodenal biopsies were normal.  MRCP with subtle inflammatory changes around the pancreas, no other pancreatic abnormalities, hepatic and splenic iron deposition, CT AP was normal without evidence of pancreatitis, RUQ US  was normal.  No recurrence of pancreatitis since then.  Cousin with Crohns Disease (on biologic therapy) and great uncle with Crohns. No Fhx of Colon CA, pancreatic or liver disease    Hx of Endometriosis with surgery in 2012.  -07/31/2023: Follow-up in the GI clinic for evaluation of BRB with nearly every BM for 6 months and mucus-like stools x 3 months, tenesmus.  Labs notable for early IDA with H/H 11.6/35, ferritin 4.6, iron 77, sat 19.8%, vitamin D  deficiency (16.6), folate deficiency (5.0).  Treated with folic acid , IV iron, and ergocalciferol .  Otherwise normal thiamine , B12.  ESR minimally elevated at 23 with normal CRP.  Normal celiac serologies, normal CMP.  Calprotectin 332. - 09/16/2023: EGD: Normal with benign gastric/duodenal biopsies - 09/16/2023: Colonoscopy: Localized mild, subtle inflammation with aphthous ulcers in the proximal ascending colon (path: Benign), localized mild inflammation with edema, erythema, aphthous ulcers in the distal 2-3 cm of the rectum (path: Severe, chronic, active colitis).  Remainder of the colon otherwise normal with benign path.  Normal TI.  Started on Canasa   suppositories  Endoscopic History: - 08/2015: Colonoscopy: few erosions in the terminal ileum (benign mucosa), few erosions at the IC valve (erosion with mildly inflamed granulation tissue without granulomas), Congested mucosa in the descending colon (path benign). Small Grade I internal hemorrhoids. - 05/2020: EGD (inpatient): Normal esophagus, stomach, duodenum.  Gastric and duodenal biopsies benign - 07/2020: Colonoscopy: Normal TI (path benign), normal colon (path benign).  Small grade 1 internal hemorrhoids - 09/16/2023: EGD: Normal with benign gastric/duodenal biopsies - 09/16/2023: Colonoscopy: Localized mild, subtle inflammation with aphthous ulcers in the proximal ascending colon (path: Benign), localized mild inflammation with edema, erythema, aphthous ulcers in the distal 2-3 cm of the rectum (path: Severe, chronic, active colitis).  Remainder of the colon otherwise normal with benign path.  Normal TI.  HPI:     Patient is a 38 y.o. female presenting to the Gastroenterology Clinic for follow-up.  Last seen in the office by me on 07/31/2023 for active colitis symptoms.  Since then, completed EGD (normal) and colonoscopy (colitis in the distal 2-3 cm of the rectum) as outlined above, started on Canasa  suppositories.  Suboptimal response to Canasa , was then started on hydrocortisone  suppository, with consideration for Pentasa  or Lialda  for maintenance depending on response.  Since that appointment, had been dealing with constipation.  Treated with MiraLAX .  Has completed IV iron with good serologic response and started on oral iron.  Vitamin D  with suboptimal response to ergocalciferol , now 20.9.  Referred to the Endocrinology Clinic for persistent vitamin D  deficiency.  Folate improving with continued folic acid .  Was also referred to GYN for menorrhagia contributing to her IDA.  Repeat ESR last month now normal at 14.  Fecal  calprotectin again elevated at 1620.  Today, she states she is  feeling overall better. Bleeding much improved with decreased volume, but still present with each BM. Less mucus and now formed stool without constipation; no longer requiring Miralax .   Saw GYN and plan for IUD to decrease bleeding.     Review of systems:     No chest pain, no SOB, no fevers, no urinary sx   Past Medical History:  Diagnosis Date   Allergy    Anemia    Anxiety    Asthma    Blood transfusion without reported diagnosis    iron infusion   COVID-19    Crohn's colitis (HCC)    Depression    GERD (gastroesophageal reflux disease)    no meds   Normal labor 03/27/2013   Postpartum care following cesarean delivery (11/9) 03/27/2013    Patient's surgical history, family medical history, social history, medications and allergies were all reviewed in Epic    Current Outpatient Medications  Medication Sig Dispense Refill   amphetamine -dextroamphetamine  (ADDERALL) 10 MG tablet Take 1 tablet (10 mg total) by mouth daily after lunch. 30 tablet 0   cetirizine  (ZYRTEC ) 10 MG tablet Take 10 mg by mouth daily.     Cholecalciferol (VITAMIN D ) 50 MCG (2000 UT) CAPS Take 1 capsule (2,000 Units total) by mouth daily. Following High Dose Vitamin D      diphenhydrAMINE  (BENADRYL  ALLERGY) 25 mg capsule      escitalopram  (LEXAPRO ) 10 MG tablet TAKE 1 TABLET(10 MG) BY MOUTH DAILY 90 tablet 1   fluconazole  (DIFLUCAN ) 150 MG tablet Take 1 tablet (150 mg total) by mouth daily on days 1, 4, 7 as needed. 3 tablet 0   fluticasone (FLONASE ALLERGY RELIEF) 50 MCG/ACT nasal spray      folic acid  (FOLVITE ) 1 MG tablet Take 1 tablet (1 mg total) by mouth daily.     hydrocortisone  (ANUSOL -HC) 25 MG suppository Use 1 suppository per rectum nightly; may increase to 1 suppository twice daily if once daily ineffective 48 suppository 1   ibuprofen  (ADVIL ) 800 MG tablet Take 800 mg by mouth 2 (two) times daily.     lisdexamfetamine (VYVANSE ) 30 MG capsule Take 1 capsule (30 mg total) by mouth daily. 30  capsule 0   mesalamine  (CANASA ) 1000 MG suppository Place 1 suppository (1,000 mg total) rectally at bedtime. 90 suppository 3   naphazoline-pheniramine (NAPHCON-A) 0.025-0.3 % ophthalmic solution 1 drop into affected eye  as needed Ophthalmic Four times a day     triamcinolone  cream (KENALOG ) 0.1 % Apply 1 Application topically 2 (two) times daily. 30 g 2   Vitamin D , Ergocalciferol , (DRISDOL ) 1.25 MG (50000 UNIT) CAPS capsule Take 1 capsule (50,000 Units total) by mouth once a week. For 8 weeks 8 capsule 0   albuterol  (VENTOLIN  HFA) 108 (90 Base) MCG/ACT inhaler Inhale 1-2 puffs into the lungs every 4 (four) hours as needed for wheezing or shortness of breath. (Patient not taking: Reported on 12/22/2023) 18 g 0   budesonide-formoterol (SYMBICORT) 80-4.5 MCG/ACT inhaler Inhale 2 puffs into the lungs 2 (two) times a day. As needed (Patient not taking: Reported on 12/22/2023)     buPROPion  (WELLBUTRIN  XL) 150 MG 24 hr tablet Take 1 tablet (150 mg total) by mouth daily. (Patient not taking: Reported on 12/22/2023) 90 tablet 1   dicyclomine  (BENTYL ) 20 MG tablet Take 1 tablet (20 mg total) by mouth 3 (three) times daily. (Patient not taking: Reported on 12/22/2023) 90 tablet 0  methocarbamol  (ROBAXIN ) 500 MG tablet Take 1 tablet (500 mg total) by mouth every 8 (eight) hours. (Patient not taking: Reported on 12/22/2023) 40 tablet 0   methylPREDNISolone  (MEDROL  DOSEPAK) 4 MG TBPK tablet Take by mouth as directed (Patient not taking: Reported on 12/22/2023) 21 each 0   nitrofurantoin , macrocrystal-monohydrate, (MACROBID ) 100 MG capsule Take 1 capsule (100 mg total) by mouth 2 (two) times daily. (Patient not taking: Reported on 12/22/2023) 10 capsule 0   phenazopyridine  (PYRIDIUM ) 200 MG tablet Take 1 tablet (200 mg total) by mouth 2 (two) times daily as needed for bladder spasms (Patient not taking: Reported on 12/22/2023) 4 tablet 0   traZODone  (DESYREL ) 50 MG tablet Take 1 tablet (50 mg total) by mouth at bedtime.  (Patient not taking: Reported on 12/22/2023) 90 tablet 3   No current facility-administered medications for this visit.    Physical Exam:     BP 110/70   Pulse 68   Ht 5' 2 (1.575 m)   Wt 176 lb (79.8 kg)   BMI 32.19 kg/m   GENERAL:  Pleasant female in NAD PSYCH: : Cooperative, normal affect Musculoskeletal:  Normal muscle tone, normal strength NEURO: Alert and oriented x 3, no focal neurologic deficits   IMPRESSION and PLAN:    1) Crohn's disease 2) Change in bowel habits  38 year old female with history of Crohn's ileocolitis, diagnosed 2008 and initially treated with Pentasa  then possibly a short course of Lialda . No prior steroids, immunomodulators, biologic therapy. Has otherwise been off any IBD therapy for many years.  Colonoscopy in 07/2020 was normal without any endoscopic or histologic evidence of IBD (i.e. deep remission without medication).  Since then, has developed symptoms over the last year, to include mucus-like stools, hematochezia.  Repeat colonoscopy in 08/2023 with localized area of active inflammation in the distal 2-3 cm of the rectum, along with a small patch of subtle inflammation in the proximal ascending colon.  No appreciable response to Canasa .  Some improvement, but certainly not resolution with appropriate trial of hydrocortisone  suppositories.  We discussed pathophysiology of Crohn's along with treatment options at length today.  Given persistent symptoms, even though this is a small distribution of disease, the location is quite symptomatic and not responding to topical therapy.  I do not think a topical 5-ASA oral agent would induce remission at that location.  Further, her calprotectin has been uptrending despite therapy.  After in-depth conversation of treatment options, to include Biologics, immunomodulators, she would like to proceed with Remicade.  - Check QuantiFERON gold, hepatitis panel - Will submit PA for Remicade for 5 mg/kg - Will use fecal  calprotectin to gauge response along with clinical symptomatology - RTC in 3 months or sooner as needed  3) Iron deficiency anemia Likely combination of her active Crohn's disease along with menorrhagia.  She reports plan for IUD to treat the latter. - Trend CBCs and iron panel  4) Vitamin D  deficiency - Referral in place to the Endocrinology clinic for persistent vitamin D  deficiency despite ergocalciferol  and supplemental vitamin D   5) Folate deficiency - Continue folic acid  - Repeat labs in 3-4 months  I spent 35 minutes of time, including in depth chart review, independent review of results as outlined above, communicating results with the patient directly, face-to-face time with the patient, coordinating care, and ordering studies and medications as appropriate, and documentation.           Sandor LULLA Flatter ,DO, FACG 12/22/2023, 8:58 AM

## 2023-12-25 ENCOUNTER — Telehealth: Payer: Self-pay

## 2023-12-25 ENCOUNTER — Other Ambulatory Visit: Payer: Self-pay | Admitting: Gastroenterology

## 2023-12-25 LAB — QUANTIFERON-TB GOLD PLUS
Mitogen-NIL: 7.59 [IU]/mL
NIL: 0.01 [IU]/mL
QuantiFERON-TB Gold Plus: NEGATIVE
TB1-NIL: 0 [IU]/mL
TB2-NIL: 0 [IU]/mL

## 2023-12-25 LAB — HEPATITIS B SURFACE ANTIBODY,QUALITATIVE: Hep B S Ab: REACTIVE — AB

## 2023-12-25 LAB — HEPATITIS B SURFACE ANTIGEN: Hepatitis B Surface Ag: NONREACTIVE

## 2023-12-25 NOTE — Telephone Encounter (Signed)
 Hello   Auth Submission: DENIED Site of care: Site of care: [facility] Payer: bcbs ppo Medication & CPT/J Code(s) submitted: Remicade (Infliximab) J1745 Diagnosis Code:  Route of submission (phone, fax, portal): portal Phone # Fax # Auth type: Buy/Bill PB Units/visits requested: 5mg /kg Reference number: [id number]  Authorization has been DENIED because pt must try/fail avsola. The denial letter is scanned in the media tab.

## 2023-12-27 ENCOUNTER — Other Ambulatory Visit: Payer: Self-pay | Admitting: Family Medicine

## 2023-12-27 DIAGNOSIS — F50819 Binge eating disorder, unspecified: Secondary | ICD-10-CM

## 2023-12-27 MED ORDER — LISDEXAMFETAMINE DIMESYLATE 30 MG PO CAPS
30.0000 mg | ORAL_CAPSULE | Freq: Every day | ORAL | 0 refills | Status: DC
  Filled 2023-12-27: qty 30, 30d supply, fill #0

## 2023-12-27 MED ORDER — AMPHETAMINE-DEXTROAMPHETAMINE 10 MG PO TABS
10.0000 mg | ORAL_TABLET | Freq: Every day | ORAL | 0 refills | Status: DC
  Filled 2023-12-27: qty 30, 30d supply, fill #0

## 2023-12-28 ENCOUNTER — Telehealth: Payer: Self-pay

## 2023-12-28 ENCOUNTER — Other Ambulatory Visit (HOSPITAL_BASED_OUTPATIENT_CLINIC_OR_DEPARTMENT_OTHER): Payer: Self-pay

## 2023-12-28 DIAGNOSIS — Z3202 Encounter for pregnancy test, result negative: Secondary | ICD-10-CM | POA: Diagnosis not present

## 2023-12-28 DIAGNOSIS — Z30430 Encounter for insertion of intrauterine contraceptive device: Secondary | ICD-10-CM | POA: Diagnosis not present

## 2023-12-28 NOTE — Telephone Encounter (Signed)
 Hello,  Patient will be scheduled as soon as possible.  Auth Submission: APPROVED Site of care: Site of care: [facility] Payer: [employer] ppo Medication & CPT/J Code(s) submitted: Avsola  (infliximab -axxq) V4878 Diagnosis Code:  Route of submission (phone, fax, portal): portal Phone # Fax # Auth type: Buy/Bill PB Units/visits requested: 5mg /kg Reference number: [id number] Approval from: 12/25/23 to 12/24/24

## 2024-01-03 ENCOUNTER — Other Ambulatory Visit: Payer: Self-pay | Admitting: Medical Genetics

## 2024-01-04 ENCOUNTER — Encounter: Payer: Self-pay | Admitting: Gastroenterology

## 2024-01-04 ENCOUNTER — Other Ambulatory Visit (HOSPITAL_BASED_OUTPATIENT_CLINIC_OR_DEPARTMENT_OTHER): Payer: Self-pay

## 2024-01-04 MED ORDER — DICYCLOMINE HCL 10 MG PO CAPS
10.0000 mg | ORAL_CAPSULE | Freq: Four times a day (QID) | ORAL | 3 refills | Status: AC | PRN
Start: 2024-01-04 — End: ?
  Filled 2024-01-04: qty 60, 15d supply, fill #0

## 2024-01-06 ENCOUNTER — Encounter: Payer: Self-pay | Admitting: Gastroenterology

## 2024-01-06 ENCOUNTER — Emergency Department (HOSPITAL_COMMUNITY)
Admission: EM | Admit: 2024-01-06 | Discharge: 2024-01-06 | Disposition: A | Discharge status: Home / Self Care | Attending: Emergency Medicine | Admitting: Emergency Medicine

## 2024-01-06 ENCOUNTER — Other Ambulatory Visit: Payer: Self-pay

## 2024-01-06 ENCOUNTER — Other Ambulatory Visit (HOSPITAL_BASED_OUTPATIENT_CLINIC_OR_DEPARTMENT_OTHER): Payer: Self-pay

## 2024-01-06 DIAGNOSIS — K859 Acute pancreatitis without necrosis or infection, unspecified: Secondary | ICD-10-CM | POA: Insufficient documentation

## 2024-01-06 DIAGNOSIS — K861 Other chronic pancreatitis: Secondary | ICD-10-CM | POA: Diagnosis not present

## 2024-01-06 DIAGNOSIS — R1013 Epigastric pain: Secondary | ICD-10-CM | POA: Diagnosis not present

## 2024-01-06 DIAGNOSIS — K5090 Crohn's disease, unspecified, without complications: Secondary | ICD-10-CM | POA: Insufficient documentation

## 2024-01-06 LAB — COMPREHENSIVE METABOLIC PANEL WITH GFR
ALT: 18 U/L (ref 0–44)
AST: 19 U/L (ref 15–41)
Albumin: 4 g/dL (ref 3.5–5.0)
Alkaline Phosphatase: 38 U/L (ref 38–126)
Anion gap: 13 (ref 5–15)
BUN: 5 mg/dL — ABNORMAL LOW (ref 6–20)
CO2: 25 mmol/L (ref 22–32)
Calcium: 9.9 mg/dL (ref 8.9–10.3)
Chloride: 100 mmol/L (ref 98–111)
Creatinine, Ser: 0.93 mg/dL (ref 0.44–1.00)
GFR, Estimated: >60 mL/min (ref >60)
Glucose, Bld: 105 mg/dL — ABNORMAL HIGH (ref 70–99)
Potassium: 4.3 mmol/L (ref 3.5–5.1)
Sodium: 138 mmol/L (ref 135–145)
Total Bilirubin: 0.7 mg/dL (ref 0.0–1.2)
Total Protein: 7.3 g/dL (ref 6.5–8.1)

## 2024-01-06 LAB — HCG, SERUM, QUALITATIVE: Preg, Serum: NEGATIVE

## 2024-01-06 LAB — LIPASE, BLOOD: Lipase: 97 U/L — ABNORMAL HIGH (ref 11–51)

## 2024-01-06 LAB — URINALYSIS, ROUTINE W REFLEX MICROSCOPIC
Bilirubin Urine: NEGATIVE
Glucose, UA: NEGATIVE mg/dL
Ketones, ur: NEGATIVE mg/dL
Nitrite: NEGATIVE
Protein, ur: NEGATIVE mg/dL
Specific Gravity, Urine: 1.01 (ref 1.005–1.030)
pH: 5 (ref 5.0–8.0)

## 2024-01-06 LAB — CBC
HCT: 39.3 % (ref 36.0–46.0)
Hemoglobin: 13.1 g/dL (ref 12.0–15.0)
MCH: 29.4 pg (ref 26.0–34.0)
MCHC: 33.3 g/dL (ref 30.0–36.0)
MCV: 88.1 fL (ref 80.0–100.0)
Platelets: 376 K/uL (ref 150–400)
RBC: 4.46 MIL/uL (ref 3.87–5.11)
RDW: 12.4 % (ref 11.5–15.5)
WBC: 5.8 K/uL (ref 4.0–10.5)
nRBC: 0 % (ref 0.0–0.2)

## 2024-01-06 LAB — RESP PANEL BY RT-PCR (RSV, FLU A&B, COVID)  RVPGX2
Influenza A by PCR: NEGATIVE
Influenza B by PCR: NEGATIVE
Resp Syncytial Virus by PCR: NEGATIVE
SARS Coronavirus 2 by RT PCR: NEGATIVE

## 2024-01-06 MED ORDER — MORPHINE SULFATE (PF) 2 MG/ML IV SOLN
2.0000 mg | Freq: Once | INTRAVENOUS | Status: AC
  Administered 2024-01-06: 2 mg via INTRAVENOUS
  Filled 2024-01-06: qty 1

## 2024-01-06 MED ORDER — MORPHINE SULFATE 15 MG PO TABS
15.0000 mg | ORAL_TABLET | Freq: Three times a day (TID) | ORAL | 0 refills | Status: AC | PRN
  Filled 2024-01-06: qty 9, 3d supply, fill #0

## 2024-01-06 MED ORDER — SODIUM CHLORIDE 0.9 % IV BOLUS
1000.0000 mL | Freq: Once | INTRAVENOUS | Status: AC
  Administered 2024-01-06: 1000 mL via INTRAVENOUS

## 2024-01-06 MED ORDER — ONDANSETRON HCL 4 MG PO TABS
4.0000 mg | ORAL_TABLET | Freq: Four times a day (QID) | ORAL | 0 refills | Status: AC
Start: 2024-01-06 — End: ?
  Filled 2024-01-06: qty 12, 3d supply, fill #0

## 2024-01-06 NOTE — Discharge Instructions (Signed)
 You were seen in the emergency room for abdominal pain This is most likely due to pancreatitis based on the blood work It is important that you drink only water tonight and slowly advance your diet to solids in the next 48 hours Take morphine  as directed for severe pain at home Do not drink alcohol or drive while taking morphine  Take Tylenol  for mild to moderate pain Take Zofran  as directed for nausea Follow-up with your GI team Continue taking all previous prescribed occasions Return to the emergency room for severe pain if you are unable to eat or drink or have any other concerns

## 2024-01-06 NOTE — Telephone Encounter (Signed)
 Inbound call from the pt requesting for the nurse to advise her on what she should do per her mychart message. Please advise.

## 2024-01-06 NOTE — Telephone Encounter (Signed)
 Lab order in epic. CT order in epic. Secure staff message sent to radiology scheduling to contact patient ASAP to schedule CT scan. MyChart message to patient with recommendations.

## 2024-01-06 NOTE — ED Notes (Signed)
 Awaiting patient from lobby.

## 2024-01-06 NOTE — ED Provider Notes (Signed)
 [facility] Provider Note   CSN: [id number] Arrival date & time: 01/06/24  9155     Patient presents with: Abdominal Pain, Diarrhea, and Chills   Sonia [name] is a 38 y.o. female.  With a history of Crohn's disease and binge eating disorder who presents to the ED for abdominal pain.  Ongoing Crohn's flare for the last month or 2.  More recently increased abdominal pain localized over the epigastric and left upper quadrant made worse with eating and drinking.  No nausea and vomiting.  Persistent bloody diarrhea.  No fevers but does endorse some chills.  Is scheduled for routine Remicade  injection tomorrow.  Is followed by Dr.Cirigliano with [facility] who did prescribe some Bentyl  but this has not provided much relief.    Abdominal Pain Associated symptoms: diarrhea   Diarrhea Associated symptoms: abdominal pain        Prior to Admission medications   Medication Sig Start Date End Date Taking? Authorizing Provider  morphine  (MSIR) 15 MG tablet Take 1 tablet (15 mg total) by mouth every 8 (eight) hours as needed for up to 3 days for severe pain (pain score 7-10). 01/06/24 01/09/24 Yes Pamella Ozell LABOR, DO  ondansetron  (ZOFRAN ) 4 MG tablet Take 1 tablet (4 mg total) by mouth every 6 (six) hours. 01/06/24  Yes Pamella Ozell LABOR, DO  albuterol  (VENTOLIN  HFA) 108 (90 Base) MCG/ACT inhaler Inhale 1-2 puffs into the lungs every 4 (four) hours as needed for wheezing or shortness of breath. Patient not taking: Reported on 12/22/2023 08/26/19   Christopher Savannah, PA-C  amphetamine -dextroamphetamine  (ADDERALL) 10 MG tablet Take 1 tablet (10 mg total) by mouth daily after lunch. 12/27/23   Ozell Heron HERO, MD  budesonide-formoterol [facility]) 80-4.5 MCG/ACT inhaler Inhale 2 puffs into the lungs 2 (two) times a day. As needed Patient not taking: Reported on 12/22/2023    [provider]  buPROPion  (WELLBUTRIN  XL) 150 MG 24 hr tablet Take 1  tablet (150 mg total) by mouth daily. Patient not taking: Reported on 12/22/2023 06/24/22   Ozell Heron HERO, MD  cetirizine  (ZYRTEC ) 10 MG tablet Take 10 mg by mouth daily.    [provider]  Cholecalciferol (VITAMIN D ) 50 MCG (2000 UT) CAPS Take 1 capsule (2,000 Units total) by mouth daily. Following High Dose Vitamin D  08/04/23   Cirigliano, Vito V, DO  dicyclomine  (BENTYL ) 10 MG capsule Take 1 capsule (10 mg total) by mouth every 6 (six) hours as needed (abdominal cramping/abdominal discomfort). 01/04/24   Cirigliano, Vito V, DO  diphenhydrAMINE  (BENADRYL  ALLERGY) 25 mg capsule     [provider]  escitalopram  (LEXAPRO ) 10 MG tablet TAKE 1 TABLET(10 MG) BY MOUTH DAILY 11/02/23   Ozell Heron HERO, MD  fluconazole  (DIFLUCAN ) 150 MG tablet Take 1 tablet (150 mg total) by mouth daily on days 1, 4, 7 as needed. 12/02/23     fluticasone (FLONASE ALLERGY RELIEF) 50 MCG/ACT nasal spray     [provider]  folic acid  (FOLVITE ) 1 MG tablet Take 1 tablet (1 mg total) by mouth daily. 08/04/23   Cirigliano, Vito V, DO  hydrocortisone  (ANUSOL -HC) 25 MG suppository Use 1 suppository per rectum nightly; may increase to 1 suppository twice daily if once daily ineffective 11/16/23   Cirigliano, Vito V, DO  ibuprofen  (ADVIL ) 800 MG tablet Take 800 mg by mouth 2 (two) times daily.    [provider]  lisdexamfetamine (VYVANSE ) 30 MG capsule Take 1  capsule (30 mg total) by mouth daily. 12/27/23   Ozell Heron HERO, MD  mesalamine  (CANASA ) 1000 MG suppository Place 1 suppository (1,000 mg total) rectally at bedtime. 09/16/23   Cirigliano, Vito V, DO  methocarbamol  (ROBAXIN ) 500 MG tablet Take 1 tablet (500 mg total) by mouth every 8 (eight) hours. Patient not taking: Reported on 12/22/2023 05/28/23     methylPREDNISolone  (MEDROL  DOSEPAK) 4 MG TBPK tablet Take by mouth as directed Patient not taking: Reported on 12/22/2023 07/10/23   Ozell Heron HERO, MD  naphazoline-pheniramine (NAPHCON-A)  0.025-0.3 % ophthalmic solution 1 drop into affected eye  as needed Ophthalmic Four times a day    [provider]  nitrofurantoin , macrocrystal-monohydrate, (MACROBID ) 100 MG capsule Take 1 capsule (100 mg total) by mouth 2 (two) times daily. Patient not taking: Reported on 12/22/2023 10/15/23     phenazopyridine  (PYRIDIUM ) 200 MG tablet Take 1 tablet (200 mg total) by mouth 2 (two) times daily as needed for bladder spasms Patient not taking: Reported on 12/22/2023 11/04/23     traZODone  (DESYREL ) 50 MG tablet Take 1 tablet (50 mg total) by mouth at bedtime. Patient not taking: Reported on 12/22/2023 06/24/22   Ozell Heron HERO, MD  triamcinolone  cream (KENALOG ) 0.1 % Apply 1 Application topically 2 (two) times daily. 11/27/22   Ozell Heron HERO, MD  Vitamin D , Ergocalciferol , (DRISDOL ) 1.25 MG (50000 UNIT) CAPS capsule Take 1 capsule (50,000 Units total) by mouth once a week. For 8 weeks 08/04/23   Cirigliano, Vito V, DO  Norgestimate-Eth Estradiol  (SPRINTEC 28 PO) Take by mouth.  09/14/19  [provider]    Allergies: Other    Review of Systems  Gastrointestinal:  Positive for abdominal pain and diarrhea.    Updated Vital Signs BP 100/62 (BP Location: Right Arm)   Pulse 67   Temp 98.2 F (36.8 C) (Oral)   Resp 17   LMP 12/22/2023   SpO2 95%   Physical Exam Vitals and nursing note reviewed.  HENT:     Head: Normocephalic and atraumatic.  Eyes:     Pupils: Pupils are equal, round, and reactive to light.  Cardiovascular:     Rate and Rhythm: Normal rate and regular rhythm.  Pulmonary:     Effort: Pulmonary effort is normal.     Breath sounds: Normal breath sounds.  Abdominal:     Palpations: Abdomen is soft.     Tenderness: There is abdominal tenderness in the epigastric area and left upper quadrant. There is no guarding or rebound.  Skin:    General: Skin is warm and dry.  Neurological:     Mental Status: She is alert.  Psychiatric:        Mood and Affect:  Mood normal.     (all labs ordered are listed, but only abnormal results are displayed) Labs Reviewed  LIPASE, BLOOD - Abnormal; Notable for the following components:      Result Value   Lipase 97 (*)    All other components within normal limits  COMPREHENSIVE METABOLIC PANEL WITH GFR - Abnormal; Notable for the following components:   Glucose, Bld 105 (*)    BUN 5 (*)    All other components within normal limits  URINALYSIS, ROUTINE W REFLEX MICROSCOPIC - Abnormal; Notable for the following components:   APPearance HAZY (*)    Hgb urine dipstick MODERATE (*)    Leukocytes,Ua TRACE (*)    Bacteria, UA RARE (*)    All other components within normal limits  RESP PANEL BY RT-PCR (RSV, FLU A&B, COVID)  [id number]  CBC  HCG, SERUM, QUALITATIVE    EKG: None  Radiology: No results found.   Procedures   Medications Ordered in the ED  sodium chloride  0.9 % bolus 1,000 mL (0 mLs Intravenous Stopped 01/06/24 1051)  morphine  (PF) 2 MG/ML injection 2 mg (2 mg Intravenous Given 01/06/24 1346)    Clinical Course as of 01/06/24 1506  Wed Jan 06, 2024  1505 Lipase elevated most consistent with acute pancreatitis based on history, examination and this laboratory value.  No significant leukocytosis.  Pain well-controlled after small dose of morphine  IV fluids.  Will discharge with instruction for bowel rest and short course of Percocet and Zofran .  She will follow-up with her GI team. [MP]    Clinical Course User Index [MP] Pamella Ozell LABOR, DO                                 Medical Decision Making 38 year old female with history as above presenting for abdominal pain.  Feels similar to prior episodes of Crohn's flare but worse in severity.  Also feels similar to prior episode of pancreatitis.  Bloody diarrhea abdominal pain made worse with p.o. intake.  Exam notable for epigastric left upper quadrant tenderness.  Differential diagnosis includes acute pancreatitis (recurrent), Crohn's flare,  viral GI illness, UTI, pregnancy or intra-abdominal infection.  Will obtain laboratory workup along with UA and pregnancy test and provide IV fluids for rehydration.  Will consider imaging after reevaluation and results of labs are back.  Patient declined offer for analgesics at this time  Amount and/or Complexity of Data Reviewed Labs: ordered.  Risk Prescription drug management.        Final diagnoses:  Acute pancreatitis, unspecified complication status, unspecified pancreatitis type  Crohn's disease without complication, unspecified gastrointestinal tract location [facility])    ED Discharge Orders          Ordered    morphine  (MSIR) 15 MG tablet  Every 8 hours PRN        01/06/24 1505    ondansetron  (ZOFRAN ) 4 MG tablet  Every 6 hours        01/06/24 1505               Pamella Ozell LABOR, DO 01/06/24 1506

## 2024-01-06 NOTE — ED Triage Notes (Signed)
 Pt. Stated, I've had abdominal pain with diarrhea for a week with cold chills. I had a IUD put in a week and half ago so hopefully its not related. I do have Crohn's colitis and this is a flare up.

## 2024-01-07 ENCOUNTER — Encounter: Attending: Gastroenterology | Admitting: Emergency Medicine

## 2024-01-07 VITALS — BP 101/69 | HR 64 | Temp 97.7°F | Resp 16 | Wt 175.9 lb

## 2024-01-07 DIAGNOSIS — K5010 Crohn's disease of large intestine without complications: Secondary | ICD-10-CM

## 2024-01-07 MED ORDER — METHYLPREDNISOLONE SODIUM SUCC 40 MG IJ SOLR
40.0000 mg | Freq: Once | INTRAMUSCULAR | Status: AC
  Administered 2024-01-07: 40 mg via INTRAVENOUS

## 2024-01-07 MED ORDER — ACETAMINOPHEN 325 MG PO TABS
650.0000 mg | ORAL_TABLET | Freq: Once | ORAL | Status: AC
  Administered 2024-01-07: 650 mg via ORAL

## 2024-01-07 MED ORDER — SODIUM CHLORIDE 0.9 % IV SOLN
5.0000 mg/kg | Freq: Once | INTRAVENOUS | Status: AC
  Administered 2024-01-07: 400 mg via INTRAVENOUS
  Filled 2024-01-07: qty 40

## 2024-01-07 MED ORDER — DIPHENHYDRAMINE HCL 25 MG PO CAPS
25.0000 mg | ORAL_CAPSULE | Freq: Once | ORAL | Status: AC
  Administered 2024-01-07: 25 mg via ORAL

## 2024-01-07 NOTE — Progress Notes (Signed)
 Diagnosis: Crohn's Disease  Provider:  [place] GAILS, DO  Procedure: IV Infusion  IV Type: Peripheral, IV Location: L Hand  Avsola  (infliximab -axxq), Dose: 400 mg  Infusion Start Time: 1341  Infusion Stop Time: 1550  Post Infusion IV Care: Observation period completed and Peripheral IV Discontinued  Discharge: Condition: Good, Destination: Home . AVS Declined  Performed by:  Delon ONEIDA Officer, RN

## 2024-01-08 ENCOUNTER — Other Ambulatory Visit (HOSPITAL_COMMUNITY)
Admission: RE | Admit: 2024-01-08 | Discharge: 2024-01-08 | Disposition: A | Discharge status: Home / Self Care | Payer: Self-pay | Source: Ambulatory Visit | Attending: Medical Genetics | Admitting: Medical Genetics

## 2024-01-12 ENCOUNTER — Ambulatory Visit (HOSPITAL_COMMUNITY)
Admission: RE | Admit: 2024-01-12 | Discharge: 2024-01-12 | Disposition: A | Discharge status: Home / Self Care | Source: Ambulatory Visit | Attending: Gastroenterology | Admitting: Gastroenterology

## 2024-01-12 DIAGNOSIS — K859 Acute pancreatitis without necrosis or infection, unspecified: Secondary | ICD-10-CM | POA: Insufficient documentation

## 2024-01-12 MED ORDER — IOHEXOL 300 MG/ML  SOLN
100.0000 mL | Freq: Once | INTRAMUSCULAR | Status: AC | PRN
  Administered 2024-01-12: 100 mL via INTRAVENOUS

## 2024-01-17 DIAGNOSIS — S92919A Unspecified fracture of unspecified toe(s), initial encounter for closed fracture: Secondary | ICD-10-CM | POA: Diagnosis not present

## 2024-01-17 DIAGNOSIS — S92511A Displaced fracture of proximal phalanx of right lesser toe(s), initial encounter for closed fracture: Secondary | ICD-10-CM | POA: Diagnosis not present

## 2024-01-19 ENCOUNTER — Ambulatory Visit: Payer: Self-pay | Admitting: Gastroenterology

## 2024-01-19 LAB — GENECONNECT MOLECULAR SCREEN: Genetic Analysis Overall Interpretation: NEGATIVE

## 2024-01-22 ENCOUNTER — Encounter: Attending: Gastroenterology | Admitting: Internal Medicine

## 2024-01-22 VITALS — BP 112/74 | HR 56 | Temp 98.2°F | Resp 16 | Wt 175.9 lb

## 2024-01-22 DIAGNOSIS — K5010 Crohn's disease of large intestine without complications: Secondary | ICD-10-CM | POA: Insufficient documentation

## 2024-01-22 MED ORDER — SODIUM CHLORIDE 0.9 % IV SOLN
5.0000 mg/kg | Freq: Once | INTRAVENOUS | Status: AC
  Administered 2024-01-22: 400 mg via INTRAVENOUS
  Filled 2024-01-22: qty 40

## 2024-01-22 MED ORDER — METHYLPREDNISOLONE SODIUM SUCC 40 MG IJ SOLR
40.0000 mg | Freq: Once | INTRAMUSCULAR | Status: AC
  Administered 2024-01-22: 40 mg via INTRAVENOUS

## 2024-01-22 MED ORDER — DIPHENHYDRAMINE HCL 25 MG PO CAPS
25.0000 mg | ORAL_CAPSULE | Freq: Once | ORAL | Status: AC
  Administered 2024-01-22: 25 mg via ORAL

## 2024-01-22 MED ORDER — ACETAMINOPHEN 325 MG PO TABS
650.0000 mg | ORAL_TABLET | Freq: Once | ORAL | Status: AC
  Administered 2024-01-22: 650 mg via ORAL

## 2024-01-22 NOTE — Progress Notes (Signed)
 Diagnosis: Crohn's Disease  Provider:  Crigliano,Vito V, DO  Procedure: IV Infusion  IV Type: Peripheral, IV Location: L Antecubital  Avsola  (infliximab -axxq), Dose: 400 mg  Infusion Start Time: 1347  Infusion Stop Time: 1600  Post Infusion IV Care: Observation period completed  Discharge: Condition: Good, Destination: Home . AVS Provided  Performed by:  Blanca Selinda SAUNDERS, LPN

## 2024-01-28 DIAGNOSIS — Z30432 Encounter for removal of intrauterine contraceptive device: Secondary | ICD-10-CM | POA: Diagnosis not present

## 2024-01-29 ENCOUNTER — Other Ambulatory Visit (HOSPITAL_BASED_OUTPATIENT_CLINIC_OR_DEPARTMENT_OTHER): Payer: Self-pay

## 2024-01-29 ENCOUNTER — Other Ambulatory Visit: Payer: Self-pay | Admitting: Family Medicine

## 2024-01-29 DIAGNOSIS — F50819 Binge eating disorder, unspecified: Secondary | ICD-10-CM

## 2024-01-29 MED ORDER — AMPHETAMINE-DEXTROAMPHETAMINE 10 MG PO TABS
10.0000 mg | ORAL_TABLET | Freq: Every day | ORAL | 0 refills | Status: DC
  Filled 2024-01-29: qty 30, 30d supply, fill #0

## 2024-01-29 MED ORDER — LISDEXAMFETAMINE DIMESYLATE 30 MG PO CAPS
30.0000 mg | ORAL_CAPSULE | Freq: Every day | ORAL | 0 refills | Status: DC
  Filled 2024-01-29: qty 30, 30d supply, fill #0

## 2024-02-05 ENCOUNTER — Ambulatory Visit: Admitting: Family Medicine

## 2024-02-15 ENCOUNTER — Other Ambulatory Visit (INDEPENDENT_AMBULATORY_CARE_PROVIDER_SITE_OTHER)

## 2024-02-15 DIAGNOSIS — K859 Acute pancreatitis without necrosis or infection, unspecified: Secondary | ICD-10-CM | POA: Diagnosis not present

## 2024-02-17 LAB — IGG 4: IgG, Subclass 4: 33 mg/dL (ref 2–96)

## 2024-02-19 ENCOUNTER — Encounter: Attending: Gastroenterology | Admitting: Internal Medicine

## 2024-02-19 VITALS — BP 114/80 | HR 82 | Temp 98.0°F | Resp 16 | Wt 175.9 lb

## 2024-02-19 DIAGNOSIS — K5010 Crohn's disease of large intestine without complications: Secondary | ICD-10-CM | POA: Diagnosis not present

## 2024-02-19 MED ORDER — DIPHENHYDRAMINE HCL 25 MG PO CAPS
25.0000 mg | ORAL_CAPSULE | Freq: Once | ORAL | Status: AC
  Administered 2024-02-19: 25 mg via ORAL

## 2024-02-19 MED ORDER — METHYLPREDNISOLONE SODIUM SUCC 40 MG IJ SOLR
40.0000 mg | Freq: Once | INTRAMUSCULAR | Status: AC
  Administered 2024-02-19: 40 mg via INTRAVENOUS

## 2024-02-19 MED ORDER — SODIUM CHLORIDE 0.9 % IV SOLN
5.0000 mg/kg | Freq: Once | INTRAVENOUS | Status: AC
  Administered 2024-02-19: 400 mg via INTRAVENOUS
  Filled 2024-02-19: qty 40

## 2024-02-19 MED ORDER — ACETAMINOPHEN 325 MG PO TABS
650.0000 mg | ORAL_TABLET | Freq: Once | ORAL | Status: AC
  Administered 2024-02-19: 650 mg via ORAL

## 2024-02-19 NOTE — Progress Notes (Cosign Needed)
 Diagnosis: Crohn's Disease  Provider:  Cirigliano,Vito  Procedure: IV Infusion  IV Type: Peripheral, IV Location: L Antecubital  Avsola  (infliximab -axxq), Dose: 400 mg  Infusion Start Time: 1045  Infusion Stop Time: 1325  Post Infusion IV Care: Observation period completed  Discharge: Condition: Good, Destination: Home . AVS Provided  Performed by:  Blanca Selinda SAUNDERS, LPN

## 2024-02-23 ENCOUNTER — Other Ambulatory Visit (HOSPITAL_BASED_OUTPATIENT_CLINIC_OR_DEPARTMENT_OTHER): Payer: Self-pay

## 2024-02-23 ENCOUNTER — Telehealth: Payer: Self-pay | Admitting: *Deleted

## 2024-02-23 DIAGNOSIS — F50819 Binge eating disorder, unspecified: Secondary | ICD-10-CM

## 2024-02-23 MED ORDER — LISDEXAMFETAMINE DIMESYLATE 30 MG PO CAPS
30.0000 mg | ORAL_CAPSULE | Freq: Every day | ORAL | 0 refills | Status: DC
  Filled 2024-02-29: qty 30, 30d supply, fill #0

## 2024-02-23 MED ORDER — AMPHETAMINE-DEXTROAMPHETAMINE 10 MG PO TABS
10.0000 mg | ORAL_TABLET | Freq: Every day | ORAL | 0 refills | Status: DC
  Filled 2024-02-29: qty 30, 30d supply, fill #0

## 2024-02-23 NOTE — Telephone Encounter (Signed)
 Put scripts on file-- she isn't due until 10/15 so they will be there for her

## 2024-02-23 NOTE — Telephone Encounter (Signed)
 Copied from CRM [id number]. Topic: Clinical - Medication Question >> Feb 23, 2024  8:54 AM Burnard DEL wrote: Reason for CRM: Patient  would like to know that since her appointment got pushed back from tomorrow since provider will not be in the office could she still have a refill of lisdexamfetamine (VYVANSE ) 30 MG capsule and  amphetamine -dextroamphetamine  (ADDERALL) 10 MG tablet?

## 2024-02-24 ENCOUNTER — Ambulatory Visit: Admitting: Family Medicine

## 2024-02-29 ENCOUNTER — Other Ambulatory Visit (HOSPITAL_BASED_OUTPATIENT_CLINIC_OR_DEPARTMENT_OTHER): Payer: Self-pay

## 2024-03-04 ENCOUNTER — Ambulatory Visit: Admitting: Family Medicine

## 2024-03-04 ENCOUNTER — Encounter (HOSPITAL_COMMUNITY): Payer: Self-pay | Admitting: Oncology

## 2024-03-04 ENCOUNTER — Encounter: Payer: Self-pay | Admitting: Gastroenterology

## 2024-03-04 ENCOUNTER — Other Ambulatory Visit (HOSPITAL_BASED_OUTPATIENT_CLINIC_OR_DEPARTMENT_OTHER): Payer: Self-pay

## 2024-03-04 VITALS — BP 110/80 | HR 68 | Temp 98.3°F | Ht 62.0 in | Wt 174.1 lb

## 2024-03-04 DIAGNOSIS — F419 Anxiety disorder, unspecified: Secondary | ICD-10-CM | POA: Diagnosis not present

## 2024-03-04 DIAGNOSIS — L700 Acne vulgaris: Secondary | ICD-10-CM | POA: Diagnosis not present

## 2024-03-04 DIAGNOSIS — F50819 Binge eating disorder, unspecified: Secondary | ICD-10-CM

## 2024-03-04 MED ORDER — LISDEXAMFETAMINE DIMESYLATE 30 MG PO CAPS
30.0000 mg | ORAL_CAPSULE | Freq: Every day | ORAL | 0 refills | Status: AC
  Filled 2024-04-04: qty 30, 30d supply, fill #0

## 2024-03-04 MED ORDER — LISDEXAMFETAMINE DIMESYLATE 30 MG PO CAPS
30.0000 mg | ORAL_CAPSULE | Freq: Every day | ORAL | 0 refills | Status: AC
  Filled 2024-05-04 (×2): qty 30, 30d supply, fill #0

## 2024-03-04 MED ORDER — ESCITALOPRAM OXALATE 10 MG PO TABS
10.0000 mg | ORAL_TABLET | Freq: Every day | ORAL | 1 refills | Status: DC
  Filled 2024-03-04: qty 90, 90d supply, fill #0

## 2024-03-04 MED ORDER — DOXYCYCLINE HYCLATE 100 MG PO TABS
100.0000 mg | ORAL_TABLET | Freq: Two times a day (BID) | ORAL | 0 refills | Status: AC
  Filled 2024-03-04: qty 14, 7d supply, fill #0

## 2024-03-04 MED ORDER — AMPHETAMINE-DEXTROAMPHETAMINE 10 MG PO TABS
10.0000 mg | ORAL_TABLET | Freq: Every day | ORAL | 0 refills | Status: DC
  Filled 2024-04-04: qty 30, 30d supply, fill #0

## 2024-03-04 MED ORDER — LISDEXAMFETAMINE DIMESYLATE 30 MG PO CAPS
30.0000 mg | ORAL_CAPSULE | Freq: Every day | ORAL | 0 refills | Status: AC
  Filled 2024-06-03: qty 30, 30d supply, fill #0

## 2024-03-04 MED FILL — Chlorhexidine Gluconate Soln 4%: CUTANEOUS | 90 days supply | Qty: 946 | Fill #0 | Status: AC

## 2024-03-04 NOTE — Progress Notes (Unsigned)
 Established Patient Office Visit  Subjective   Patient ID: Sonia [name], female    DOB: [birth year]  Age: 38 y.o. MRN: [id number]  Chief Complaint  Patient presents with  . Medical Management of Chronic Issues    Pt is here for medication refills. She reports that her ADHD medications have been working well. Same stable regimen as previous, no side effects that she has noticed.  Pt reports that she got an IUD a couple months ago and it made her crazy and it caused her face to break out with severe acne. She had it removed and her mentation is better, but the acne has not gone away. States that it got a little better  about a week but it has not gone away. Is concerned because the lesions are painful and nodular.   Pt has been having irregular bleeding.      Current Outpatient Medications  Medication Instructions  . albuterol  (VENTOLIN  HFA) 108 (90 Base) MCG/ACT inhaler 1-2 puffs, Inhalation, Every 4 hours PRN  . [START ON 03/22/2024] amphetamine -dextroamphetamine  (ADDERALL) 10 MG tablet 10 mg, Oral, Daily after lunch  . budesonide-formoterol (SYMBICORT) 80-4.5 MCG/ACT inhaler 2 puffs, 2 times daily  . cetirizine  (ZYRTEC ) 10 mg, Daily  . chlorhexidine  (HIBICLENS) 4 % external liquid Apply to the affected area(s) topically daily as needed for up to 10 days.  . dicyclomine  (BENTYL ) 10 mg, Oral, Every 6 hours PRN  . diphenhydrAMINE  (BENADRYL  ALLERGY) 25 mg capsule   . doxycycline  (VIBRA -TABS) 100 mg, Oral, 2 times daily  . escitalopram  (LEXAPRO ) 10 mg, Oral, Daily  . fluticasone (FLONASE ALLERGY RELIEF) 50 MCG/ACT nasal spray   . ibuprofen  (ADVIL ) 800 mg, 2 times daily  . inFLIXimab  (REMICADE ) 100 MG injection Every 8 weeks  . [START ON 05/19/2024] lisdexamfetamine (VYVANSE ) 30 mg, Oral, Daily  . [START ON 03/22/2024] lisdexamfetamine (VYVANSE ) 30 mg, Oral, Daily  . [START ON 04/21/2024] lisdexamfetamine (VYVANSE ) 30 mg, Oral, Daily  . MAGNESIUM PO 250 mg, Daily at  bedtime  . naphazoline-pheniramine (NAPHCON-A) 0.025-0.3 % ophthalmic solution 1 drop into affected eye  as needed Ophthalmic Four times a day  . ondansetron  (ZOFRAN ) 4 mg, Oral, Every 6 hours  . OVER THE COUNTER MEDICATION Beef organ supplement-once a day  . triamcinolone  cream (KENALOG ) 0.1 % 1 Application, Topical, 2 times daily    Patient Active Problem List   Diagnosis Date Noted  . Dyshidrotic eczema 11/27/2022  . Spinal stenosis of cervical region 08/28/2022  . Cervical pain (neck) 08/21/2022  . Protrusion of cervical intervertebral disc 08/21/2022  . Impingement syndrome of right shoulder 08/21/2022  . Binge eating disorder 01/24/2022  . Palpitations 11/26/2021  . Abdominal pain, epigastric   . Hypoglycemia without diagnosis of diabetes mellitus 05/20/2020  . 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 Acute pancreatitis 05/19/2020  . Iron deficiency anemia due to chronic blood loss 02/14/2020  . Moderate persistent asthma 09/06/2019  . Insomnia 09/29/2018  . Bulimia nervosa, purging type (HCC) 09/15/2018  . Moderate major depression (HCC) 09/15/2018  . Crohn's disease of colon without complication (HCC) 03/01/2018  . LUQ abdominal pain 03/01/2018  . Crohn's disease (HCC) 08/30/2014  . Headache disorder 08/30/2014  . Anxiety 06/18/2009  . GERD 05/23/2009     ROS    Objective:     BP 110/80   Pulse 68   Temp 98.3 F (36.8 C) (Oral)   Ht 5' 2 (1.575 m)   Wt 174 lb 1.6 oz (79 kg)   LMP 02/12/2024 (  Approximate)   SpO2 98%   BMI 31.84 kg/m  {Vitals History (Optional):23777}  Physical Exam   No results found for any visits on 03/04/24.  {Labs (Optional):23779}  The ASCVD Risk score (Arnett DK, et al., 2019) failed to calculate for the following reasons:   The 2019 ASCVD risk score is only valid for ages 73 to 16    Assessment & Plan:  Cystic acne vulgaris -     Doxycycline  Hyclate; Take 1 tablet (100 mg total) by mouth 2 (two) times daily for 7 days.  Dispense: 14  tablet; Refill: 0 -     Chlorhexidine  Gluconate; Apply to the affected area(s) topically daily as needed for up to 10 days.  Dispense: 946 mL; Refill: 0  Binge eating disorder -     Lisdexamfetamine Dimesylate ; Take 1 capsule (30 mg total) by mouth daily.  Dispense: 30 capsule; Refill: 0 -     Amphetamine -Dextroamphetamine ; Take 1 tablet (10 mg total) by mouth daily after lunch.  Dispense: 30 tablet; Refill: 0 -     Lisdexamfetamine Dimesylate ; Take 1 capsule (30 mg total) by mouth daily.  Dispense: 30 capsule; Refill: 0 -     Lisdexamfetamine Dimesylate ; Take 1 capsule (30 mg total) by mouth daily.  Dispense: 30 capsule; Refill: 0  Anxiety -     Escitalopram  Oxalate; Take 1 tablet (10 mg total) by mouth daily.  Dispense: 90 tablet; Refill: 1     Return in about 4 months (around 07/05/2024) for annual physical exam.    Heron CHRISTELLA Sharper, MD

## 2024-03-09 DIAGNOSIS — L700 Acne vulgaris: Secondary | ICD-10-CM | POA: Insufficient documentation

## 2024-03-09 NOTE — Assessment & Plan Note (Signed)
 Chronic, stable sx on the vyvanse  30 mg daily, will continue as prescribed.

## 2024-03-09 NOTE — Assessment & Plan Note (Signed)
 Sx stable on 10 mg lexapro  daily, will refill for her today

## 2024-03-09 NOTE — Assessment & Plan Note (Signed)
 New since having IUD placed, then removed. Will treat with oral doxycycline  and recommended hibiclens washes to help reduce outbreaks in the future.

## 2024-03-16 ENCOUNTER — Other Ambulatory Visit (HOSPITAL_BASED_OUTPATIENT_CLINIC_OR_DEPARTMENT_OTHER): Payer: Self-pay

## 2024-03-16 ENCOUNTER — Telehealth: Admitting: Physician Assistant

## 2024-03-16 DIAGNOSIS — L2084 Intrinsic (allergic) eczema: Secondary | ICD-10-CM | POA: Diagnosis not present

## 2024-03-16 MED ORDER — PREDNISONE 10 MG (21) PO TBPK
ORAL_TABLET | ORAL | 0 refills | Status: DC
  Filled 2024-03-16: qty 21, 6d supply, fill #0

## 2024-03-16 MED ORDER — TRIAMCINOLONE ACETONIDE 0.1 % EX CREA
1.0000 | TOPICAL_CREAM | Freq: Two times a day (BID) | CUTANEOUS | 0 refills | Status: AC
  Filled 2024-03-16: qty 45, 90d supply, fill #0

## 2024-03-16 NOTE — Progress Notes (Signed)
 E-Visit for Eczema  We are sorry that you are not feeling well. Here is how we plan to help! Based on what you shared with me it looks like you have eczema (atopic dermatitis).  Although the cause of eczema is not completely understood, genetics appear to play a strong role, and people with a family history of eczema are at increased risk of developing the condition. In most people with eczema, there is a genetic abnormality in the outermost layer of the skin, called the epidermis   Most people with eczema develop their first symptoms as children, before the age of 30. Intense itching of the skin, patches of redness, small bumps, and skin flaking are common. Scratching can further inflame the skin and worsen the itching. The itchiness may be more noticeable at nighttime.  Eczema commonly affects the back of the neck, the elbow creases, and the backs of the knees. Other affected areas may include the face, wrists, and forearms. The skin may become thickened and darkened, or even scarred, from repeated scratching. Eliminating factors that aggravate your eczema symptoms can help to control the symptoms. Possible triggers may include: ? Cold or dry environments ? Sweating ? Emotional stress or anxiety ? Rapid temperature changes ? Exposure to certain chemicals or cleaning solutions, including soaps and detergents, perfumes and cosmetics, wool or synthetic fibers, dust, sand, and cigarette smoke Keeping your skin hydrated Emollients -- Emollients are creams and ointments that moisturize the skin and prevent it from drying out. The best emollients for people with eczema are thick creams (such as Eucerin, Cetaphil, and Nutraderm) or ointments (such as petroleum jelly, Aquaphor, and Vaseline), which contain little to no water. Emollients are most effective when applied immediately after bathing. Emollients can be applied twice a day or more often if needed. Lotions contain more water than creams and  ointments and are less effective for moisturizing the skin. Bathing -- It is not clear if showers or baths are better for keeping the skin hydrated. Lukewarm baths or showers can hydrate and cool the skin, temporarily relieving itching from eczema. An unscented, mild soap or non-soap cleanser (such as Cetaphil) should be used sparingly. Apply an emollient immediately after bathing or showering to prevent your skin from drying out as a result of water evaporation. Emollient bath additives (products you add to the bath water) have not been found to help relieve symptoms. Hot or long baths (more than 10 to 15 minutes) and showers should be avoided since they can dry out the skin.  Based on what you shared with me you may have eczema.   I have prescribed: and Triamcinalone ointment (or cream). Apply to the effected areas twice per day. Do not apply to face, axilla, or genitalia.   I am prescribing a 6 day prednisone  taper.  Day 1: Take 2 tablets with breakfast, 1 tablet with lunch, 1 tablet with supper, and 2 tablets at bedtime Day 2: Take 2 tablets with breakfast, 1 tablet with lunch, 1 tablet with supper, and 1 tablet at bedtime Day 3: Take 1 tablet at breakfast, 1 tablet with lunch, 1 tablet with supper, and 1 tablet at bedtime Day 4: Take 1 tablet with breakfast, 1 tablet with supper and 1 tablet at bedtime Day 5: Take 1 tablet with breakfast and 1 tablet with supper or bedtime Day 6: Take 1 tablet with breakfast  I recommend dilute bleach baths for people with eczema. These baths help to decrease the number of bacteria on the  skin that can cause infections or worsen symptoms. To prepare a bleach bath, one-fourth to one-half cup of bleach is placed in a full bathtub (about 40 gallons) of water. Bleach baths are usually taken for 5 to 10 minutes twice per week and should be followed by application of an emollient (listed above). I recommend you take Benadryl  25mg  - 50mg  every 4 hours to control the  symptoms (including itching) but if they last over 24 hours it is best that you see an office based provider for follow up.  HOME CARE: Take lukewarm showers or baths Apply creams and ointments to prevent the skin from drying (Eucerin, Cetaphil, Nutraderm, petroleum jelly, Aquaphor or Vaseline) - these products contain less water than other lotions and are more effective for moisturizing the skin Limit exposure to cold or dry environments, sweating, emotional stress and anxiety, rapid temperature changes and exposure to chemicals and cleaning products, soaps and detergents, perfumes, cosmetics, wool and synthetic fibers, dust, sand and cigarette- factors which can aggravate eczema symptoms.  Use a hydrocortisone  cream once or twice a day Take an antihistamine like Benadryl  for widespread rashes that itch.  The adult dosage of Benadryl  is 25-50 mg by mouth 4 times daily. Caution: This type of medication may cause sleepiness.  Do not drink alcohol, drive, or operate dangerous machinery while taking antihistamines.  Do not take these medications if you have prostate enlargement.  Read the package instructions thoroughly on all medications that you take.  GET HELP RIGHT AWAY IF: Symptoms that don't go away after treatment. Severe itching that persists. You develop a fever. Your skin begins to drain. You have a sore throat. You become short of breath.  MAKE SURE YOU   Understand these instructions. Will watch your condition. Will get help right away if you are not doing well or get worse.    Thank you for choosing an e-visit.  Your e-visit answers were reviewed by a board certified advanced clinical practitioner to complete your personal care plan. Depending upon the condition, your plan could have included both over the counter or prescription medications.  Please review your pharmacy choice. Make sure the pharmacy is open so you can pick up prescription now. If there is a problem, you may  contact your provider through [employer] and have the prescription routed to another pharmacy.  Your safety is important to us . If you have drug allergies check your prescription carefully.   For the next 24 hours you can use MyChart to ask questions about today's visit, request a non-urgent call back, or ask for a work or school excuse. You will get an email in the next two days asking about your experience. I hope that your e-visit has been valuable and will speed your recovery.   I have spent 5 minutes in review of e-visit questionnaire, review and updating patient chart, medical decision making and response to patient.   Delon CHRISTELLA Dickinson, PA-C

## 2024-04-04 ENCOUNTER — Other Ambulatory Visit (HOSPITAL_BASED_OUTPATIENT_CLINIC_OR_DEPARTMENT_OTHER): Payer: Self-pay

## 2024-04-18 ENCOUNTER — Ambulatory Visit: Admitting: "Endocrinology

## 2024-04-18 ENCOUNTER — Other Ambulatory Visit

## 2024-04-18 ENCOUNTER — Encounter: Payer: Self-pay | Admitting: "Endocrinology

## 2024-04-18 VITALS — BP 120/70 | HR 75 | Ht 62.0 in | Wt 177.0 lb

## 2024-04-18 DIAGNOSIS — E559 Vitamin D deficiency, unspecified: Secondary | ICD-10-CM | POA: Diagnosis not present

## 2024-04-18 DIAGNOSIS — Z8349 Family history of other endocrine, nutritional and metabolic diseases: Secondary | ICD-10-CM

## 2024-04-18 DIAGNOSIS — N643 Galactorrhea not associated with childbirth: Secondary | ICD-10-CM

## 2024-04-18 DIAGNOSIS — R748 Abnormal levels of other serum enzymes: Secondary | ICD-10-CM

## 2024-04-18 NOTE — Progress Notes (Signed)
 Outpatient Endocrinology Note Obadiah Birmingham, MD    Sonia [name] 05-06-1986 [id number]  Referring Provider: San Sandor GAILS, DO Primary Care Provider: Ozell Heron HERO, MD Reason for consultation: Subjective   Assessment & Plan  Diagnoses and all orders for this visit:  Low serum alkaline phosphatase -     Renal function panel -     VITAMIN D  25 Hydroxy (Vit-D Deficiency, Fractures) -     Magnesium -     Vitamin D  1,25 dihydroxy -     Alkaline phosphatase, bone specific -     Vitamin B6  Vitamin D  deficiency -     Renal function panel -     VITAMIN D  25 Hydroxy (Vit-D Deficiency, Fractures) -     Magnesium -     Vitamin D  1,25 dihydroxy -     Alkaline phosphatase, bone specific -     Vitamin B6  Galactorrhea -     Prolactin  Family history of pituitary disease   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   I have reviewed current medications, nurse's notes, allergies, vital signs, past medical and surgical history, family medical history, and social history for this encounter. Counseled patient on symptoms, examination findings, lab findings, imaging results, treatment decisions and monitoring and prognosis. The patient understood the recommendations and agrees with the treatment plan. All questions regarding treatment plan were fully answered.  Obadiah Birmingham, MD  04/18/24   History of Present Illness HPI  Sonia [name] is a 38 y.o. 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  Has crohn's colitis, with intermittent flare ups. On treatment for it. 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 Started it due to cramps in legs and feet which ahs resovled with supplementation, and her restless leg feeling has improved  Not taking any Vit D at this time   Dad had pituitary tumor, sister has empty sella, cousin on paternal side also has pituitary tumor Can squeeze milk out of her breast  calcium, phosphorus, magnesium, alkaline phosphatase, creatinine, parathyroid hormone (PTH), 25(OH) vitamin D , and 1,25(OH)2 vitamin D . Levels of PLP, PPi in plasma, and PEA in urine determine the diagnosis. Assess the alkaline phosphatase and vitamin B6 levels. The former is consistently low and the latter consistently elevated. Do not use ethylenediaminetetraacetic acid (EDTA) tubes because these cause erroneous test results.     Physical Exam  BP 120/70   Pulse 75   Ht 5' 2 (1.575 m)   Wt 177 lb (80.3 kg)   SpO2 98%   BMI 32.37 kg/m    Constitutional: well developed, well nourished (very dry, cracking skin around nail beds in both  hands, normal in toe nail beds) Head: normocephalic, atraumatic Eyes: sclera anicteric, no redness Neck: supple Lungs: normal respiratory effort Neurology: alert and oriented Skin: dry, no appreciable rashes Musculoskeletal: no appreciable defects Psychiatric: normal mood and affect   Current Medications Patient's Medications  New Prescriptions   No medications on file  Previous Medications   ALBUTEROL  (VENTOLIN  HFA)  108 (90 BASE) MCG/ACT INHALER    Inhale 1-2 puffs into the lungs every 4 (four) hours as needed for wheezing or shortness of breath.   AMPHETAMINE -DEXTROAMPHETAMINE  (ADDERALL) 10 MG TABLET    Take 1 tablet (10 mg total) by mouth daily after lunch.   BUDESONIDE-FORMOTEROL (SYMBICORT) 80-4.5 MCG/ACT INHALER    Inhale 2 puffs into the lungs 2 (two) times a day. As needed   CETIRIZINE  (ZYRTEC ) 10 MG TABLET    Take 10 mg by mouth daily.   CHLORHEXIDINE  (HIBICLENS ) 4 % EXTERNAL LIQUID    Apply to the affected area(s) topically daily as needed for up to 10 days.   DICYCLOMINE  (BENTYL ) 10 MG CAPSULE    Take 1 capsule (10 mg total) by mouth every 6 (six) hours as needed (abdominal cramping/abdominal discomfort).   DIPHENHYDRAMINE  (BENADRYL  ALLERGY) 25 MG CAPSULE       ESCITALOPRAM  (LEXAPRO ) 10 MG TABLET    Take 1 tablet (10 mg total) by mouth daily.   FLUTICASONE (FLONASE ALLERGY RELIEF) 50 MCG/ACT NASAL SPRAY       IBUPROFEN  (ADVIL ) 800 MG TABLET    Take 800 mg by mouth 2 (two) times daily.   INFLIXIMAB  (REMICADE ) 100 MG INJECTION    Inject into the vein every 8 (eight) weeks.   LISDEXAMFETAMINE (VYVANSE ) 30 MG CAPSULE    Take 1 capsule (30 mg total) by mouth daily.   LISDEXAMFETAMINE (VYVANSE ) 30 MG CAPSULE    Take 1 capsule (30 mg total) by mouth daily.   LISDEXAMFETAMINE (VYVANSE ) 30 MG CAPSULE    Take 1 capsule (30 mg total) by mouth daily.   MAGNESIUM PO    Take 250 mg by mouth at bedtime.   NAPHAZOLINE-PHENIRAMINE (NAPHCON-A) 0.025-0.3 % OPHTHALMIC SOLUTION    1 drop into affected eye  as needed Ophthalmic Four times a day   ONDANSETRON  (ZOFRAN ) 4 MG TABLET    Take 1 tablet (4 mg total) by mouth every 6 (six) hours.   OVER THE COUNTER MEDICATION    Beef organ supplement-once a day   PREDNISONE  (STERAPRED UNI-PAK 21 TAB) 10 MG (21) TBPK TABLET    Take 6 tablets by mouth on day 1, 5 tabs on day 2, 4 tabs on day 3, 3 tabs on day 4, 2 tabs on day 5, 1 tab on day 6. Then stop.   TRIAMCINOLONE  CREAM  (KENALOG ) 0.1 %    Apply to the affected area(s) topically 2 (two) times daily.  Modified Medications   No medications on file  Discontinued Medications   No medications on file    Allergies Allergies  Allergen Reactions   Other Other (See Comments)    Mold, mildew, grass, fungus, trees cause swelling of eyes    Past Medical History Past Medical History:  Diagnosis Date   Allergy    Anemia    Anxiety    Asthma    Blood transfusion without reported diagnosis    iron infusion   COVID-19    Crohn's colitis (HCC)    Depression    GERD (gastroesophageal reflux disease)    no meds   Normal labor 03/27/2013  Postpartum care following cesarean delivery (11/9) 03/27/2013    Past Surgical History Past Surgical History:  Procedure Laterality Date   BIOPSY  05/24/2020   Procedure: BIOPSY;  Surgeon: Albertus Gordy HERO, MD;  Location: [facility] ENDOSCOPY;  Service: Gastroenterology;;   CERVICAL SPINE SURGERY     CESAREAN SECTION N/A 03/27/2013   Procedure: CESAREAN SECTION;  Surgeon: Charlie JINNY Flowers, MD;  Location: [facility] ORS;  Service: Obstetrics;  Laterality: N/A;   CESAREAN SECTION N/A 09/04/2014   Procedure: REPEAT CESAREAN SECTION;  Surgeon: Ovid All, MD;  Location: [facility] ORS;  Service: Obstetrics;  Laterality: N/A;   ESOPHAGOGASTRODUODENOSCOPY (EGD) WITH PROPOFOL  N/A 05/24/2020   Procedure: ESOPHAGOGASTRODUODENOSCOPY (EGD) WITH PROPOFOL ;  Surgeon: Albertus Gordy HERO, MD;  Location[facility] ENDOSCOPY;  Service: Gastroenterology;  Laterality: N/A;   HAND SURGERY  2012   with nerve block   LAPAROSCOPIC ENDOMETRIOSIS FULGURATION     WISDOM TOOTH EXTRACTION      Family History family history includes Alcohol abuse in her maternal grandfather; Anxiety disorder in her mother and sister; Arthritis-Osteo in her maternal grandmother; COPD in her mother; Cancer in her maternal grandfather; Crohn's disease in her cousin; Depression in her sister; Diverticulitis in her mother; Heart disease in her maternal  grandmother and mother; Heart failure in her mother; Hernia in her mother; Kidney disease in her mother; Osteoporosis in her mother; Other in her father; Rheum arthritis in her mother; Stroke (age of onset: 26) in her mother.  Social History Social History   Socioeconomic History   Marital status: Married    Spouse name: Not on file   Number of children: 2   Years of education: Not on file   Highest education level: Associate degree: occupational, scientist, product/process development, or vocational program  Occupational History   Occupation: stay at home mother  Tobacco Use   Smoking status: Never   Smokeless tobacco: Former    Types: Associate Professor status: Never Used  Substance and Sexual Activity   Alcohol use: Yes    Comment: occasionally   Drug use: No   Sexual activity: Yes    Birth control/protection: None  Other Topics Concern   Not on file  Social History Narrative   Not on file   Social Drivers of Health   Financial Resource Strain: Low Risk  (03/04/2024)   Overall Financial Resource Strain (CARDIA)    Difficulty of Paying Living Expenses: Not very hard  Food Insecurity: No Food Insecurity (03/04/2024)   Hunger Vital Sign    Worried About Running Out of Food in the Last Year: Never true    Ran Out of Food in the Last Year: Never true  Transportation Needs: No Transportation Needs (03/04/2024)   PRAPARE - Administrator, Civil Service (Medical): No    Lack of Transportation (Non-Medical): No  Physical Activity: Insufficiently Active (03/04/2024)   Exercise Vital Sign    Days of Exercise per Week: 3 days    Minutes of Exercise per Session: 40 min  Stress: No Stress Concern Present (03/04/2024)   [employer] of Occupational Health - Occupational Stress Questionnaire    Feeling of Stress: Only a little  Social Connections: Moderately Integrated (03/04/2024)   Social Connection and Isolation Panel    Frequency of Communication with Friends and Family: More  than three times a week    Frequency of Social Gatherings with Friends and Family: Once a week    Attends Religious Services: 1 to 4 times per year  Active Member of Clubs or Organizations: No    Attends Banker Meetings: Not on file    Marital Status: Married  Intimate Partner Violence: Not on file    Lab Results  Component Value Date   CHOL 190 11/27/2022   Lab Results  Component Value Date   HDL 55.00 11/27/2022   Lab Results  Component Value Date   LDLCALC 118 (H) 11/27/2022   Lab Results  Component Value Date   TRIG 83.0 11/27/2022   Lab Results  Component Value Date   CHOLHDL 3 11/27/2022   Lab Results  Component Value Date   CREATININE 0.93 01/06/2024   Lab Results  Component Value Date   GFR 92.66 07/31/2023      Component Value Date/Time   NA 138 01/06/2024 0909   K 4.3 01/06/2024 0909   CL 100 01/06/2024 0909   CO2 25 01/06/2024 0909   GLUCOSE 105 (H) 01/06/2024 0909   BUN 5 (L) 01/06/2024 0909   CREATININE 0.93 01/06/2024 0909   CALCIUM 9.9 01/06/2024 0909   PROT 7.3 01/06/2024 0909   ALBUMIN 4.0 01/06/2024 0909   AST 19 01/06/2024 0909   ALT 18 01/06/2024 0909   ALKPHOS 38 01/06/2024 0909   BILITOT 0.7 01/06/2024 0909   GFRNONAA >60 01/06/2024 0909   GFRAA >60 12/07/2019 1459      Latest Ref Rng & Units 01/06/2024    9:09 AM 07/31/2023   11:54 AM 11/27/2022    8:46 AM  BMP  Glucose 70 - 99 mg/dL 894  96  94   BUN 6 - 20 mg/dL 5  7  8    Creatinine 0.44 - 1.00 mg/dL 9.06  9.18  9.13   Sodium 135 - 145 mmol/L 138  138  135   Potassium 3.5 - 5.1 mmol/L 4.3  4.2  4.2   Chloride 98 - 111 mmol/L 100  102  100   CO2 22 - 32 mmol/L 25  29  28    Calcium 8.9 - 10.3 mg/dL 9.9  9.5  89.8        Component Value Date/Time   WBC 5.8 01/06/2024 0909   RBC 4.46 01/06/2024 0909   HGB 13.1 01/06/2024 0909   HCT 39.3 01/06/2024 0909   PLT 376 01/06/2024 0909   MCV 88.1 01/06/2024 0909   MCH 29.4 01/06/2024 0909   MCHC 33.3 01/06/2024  0909   RDW 12.4 01/06/2024 0909   LYMPHSABS 1.1 12/22/2023 0934   MONOABS 0.4 12/22/2023 0934   EOSABS 0.4 12/22/2023 0934   BASOSABS 0.1 12/22/2023 0934   Lab Results  Component Value Date   TSH 0.97 04/29/2021   TSH 0.70 02/16/2019   TSH 0.72 01/19/2018   FREET4 0.71 04/29/2021         Parts of this note may have been dictated using voice recognition software. There may be variances in spelling and vocabulary which are unintentional. Not all errors are proofread. Please notify the dino if any discrepancies are noted or if the meaning of any statement is not clear.

## 2024-04-21 LAB — PROLACTIN: Prolactin: 9.4 ng/mL

## 2024-04-22 ENCOUNTER — Ambulatory Visit

## 2024-04-25 LAB — RENAL FUNCTION PANEL
Albumin: 4.4 g/dL (ref 3.6–5.1)
BUN: 11 mg/dL (ref 7–25)
CO2: 33 mmol/L — ABNORMAL HIGH (ref 20–32)
Calcium: 9.6 mg/dL (ref 8.6–10.2)
Chloride: 102 mmol/L (ref 98–110)
Creat: 0.75 mg/dL (ref 0.50–0.97)
Glucose, Bld: 96 mg/dL (ref 65–99)
Phosphorus: 3.7 mg/dL (ref 2.5–4.5)
Potassium: 4.3 mmol/L (ref 3.5–5.3)
Sodium: 139 mmol/L (ref 135–146)

## 2024-04-25 LAB — ALKALINE PHOSPHATASE ISOENZYMES
Alkaline phosphatase (APISO): 30 U/L — ABNORMAL LOW (ref 31–125)
Bone Isoenzymes: 44 % (ref 28–66)
Intestinal Isoenzymes: 3 % (ref 1–24)
Liver Isoenzymes: 53 % (ref 25–69)
Macrohepatic isoenzymes: 0 % (ref <=0)
Placental isoenzymes: 0 % (ref <=0)

## 2024-04-25 LAB — VITAMIN B6: Vitamin B6: 27.5 ng/mL — ABNORMAL HIGH (ref 2.1–21.7)

## 2024-04-25 LAB — VITAMIN D 1,25 DIHYDROXY
Vitamin D 1, 25 (OH)2 Total: 43 pg/mL (ref 18–72)
Vitamin D2 1, 25 (OH)2: 10 pg/mL
Vitamin D3 1, 25 (OH)2: 33 pg/mL

## 2024-04-25 LAB — VITAMIN D 25 HYDROXY (VIT D DEFICIENCY, FRACTURES): Vit D, 25-Hydroxy: 27 ng/mL — ABNORMAL LOW (ref 30–100)

## 2024-04-25 LAB — MAGNESIUM: Magnesium: 2.2 mg/dL (ref 1.5–2.5)

## 2024-04-27 ENCOUNTER — Telehealth: Payer: Self-pay

## 2024-04-27 NOTE — Telephone Encounter (Signed)
 Patient is receiving co-pay assistance  Medication: AVSOLA  Program: [employer] Approval Dates: Approved from 04/27/2024  until 03/14/2026 ID: [id number] Award Amount: $ 4,500

## 2024-04-29 ENCOUNTER — Encounter: Attending: Gastroenterology | Admitting: Emergency Medicine

## 2024-04-29 VITALS — BP 103/70 | HR 73 | Temp 97.9°F | Resp 16

## 2024-04-29 DIAGNOSIS — K5010 Crohn's disease of large intestine without complications: Secondary | ICD-10-CM | POA: Diagnosis not present

## 2024-04-29 MED ORDER — ACETAMINOPHEN 325 MG PO TABS
650.0000 mg | ORAL_TABLET | Freq: Once | ORAL | Status: AC
  Administered 2024-04-29: 650 mg via ORAL

## 2024-04-29 MED ORDER — DIPHENHYDRAMINE HCL 25 MG PO CAPS
25.0000 mg | ORAL_CAPSULE | Freq: Once | ORAL | Status: AC
  Administered 2024-04-29: 25 mg via ORAL

## 2024-04-29 MED ORDER — SODIUM CHLORIDE 0.9 % IV SOLN
5.0000 mg/kg | Freq: Once | INTRAVENOUS | Status: AC
  Administered 2024-04-29: 400 mg via INTRAVENOUS
  Filled 2024-04-29: qty 40

## 2024-04-29 MED ORDER — METHYLPREDNISOLONE SODIUM SUCC 40 MG IJ SOLR
40.0000 mg | Freq: Once | INTRAMUSCULAR | Status: AC
  Administered 2024-04-29: 40 mg via INTRAVENOUS

## 2024-04-29 NOTE — Progress Notes (Signed)
 Diagnosis: Crohn's Disease  Provider:  Cirigliano,Vito   Procedure: IV Infusion  IV Type: Peripheral, IV Location: R Antecubital   Avsola  (infliximab -axxq), Dose: 400 mg  Infusion Start Time: 1021  Infusion Stop Time: 1250  Post Infusion IV Care: Peripheral IV Discontinued  Discharge: Condition: Good, Destination: Home . AVS Declined  Performed by:  Delon ONEIDA Officer, RN

## 2024-05-02 ENCOUNTER — Telehealth: Admitting: "Endocrinology

## 2024-05-04 ENCOUNTER — Other Ambulatory Visit (HOSPITAL_BASED_OUTPATIENT_CLINIC_OR_DEPARTMENT_OTHER): Payer: Self-pay

## 2024-05-04 ENCOUNTER — Other Ambulatory Visit: Payer: Self-pay

## 2024-05-04 ENCOUNTER — Encounter: Payer: Self-pay | Admitting: Family Medicine

## 2024-05-04 ENCOUNTER — Other Ambulatory Visit: Payer: Self-pay | Admitting: Family Medicine

## 2024-05-04 DIAGNOSIS — F50819 Binge eating disorder, unspecified: Secondary | ICD-10-CM

## 2024-05-05 ENCOUNTER — Other Ambulatory Visit: Payer: Self-pay

## 2024-05-05 ENCOUNTER — Other Ambulatory Visit (HOSPITAL_BASED_OUTPATIENT_CLINIC_OR_DEPARTMENT_OTHER): Payer: Self-pay

## 2024-05-05 ENCOUNTER — Telehealth: Payer: Self-pay

## 2024-05-05 ENCOUNTER — Telehealth (INDEPENDENT_AMBULATORY_CARE_PROVIDER_SITE_OTHER): Admitting: "Endocrinology

## 2024-05-05 ENCOUNTER — Encounter: Payer: Self-pay | Admitting: "Endocrinology

## 2024-05-05 VITALS — Ht 62.0 in | Wt 177.0 lb

## 2024-05-05 DIAGNOSIS — Z9189 Other specified personal risk factors, not elsewhere classified: Secondary | ICD-10-CM

## 2024-05-05 DIAGNOSIS — R748 Abnormal levels of other serum enzymes: Secondary | ICD-10-CM

## 2024-05-05 MED ORDER — AMPHETAMINE-DEXTROAMPHETAMINE 10 MG PO TABS
10.0000 mg | ORAL_TABLET | Freq: Every day | ORAL | 0 refills | Status: DC
  Filled 2024-05-05: qty 30, 30d supply, fill #0

## 2024-05-05 NOTE — Telephone Encounter (Signed)
 Lvm for pt to call back.

## 2024-05-05 NOTE — Progress Notes (Signed)
 The patient reports they are currently: [name]. I spent 14-15 minutes on the video with the patient on the date of service. I spent an additional 15 minutes on pre- and post-visit activities on the date of service.   The patient was physically located in [place]  or a state in which I am permitted to provide care. The patient and/or parent/guardian understood that s/he may incur co-pays and cost sharing, and agreed to the telemedicine visit. The visit was reasonable and appropriate under the circumstances given the patient's presentation at the time.  The patient and/or parent/guardian has been advised of the potential risks and limitations of this mode of treatment (including, but not limited to, the absence of in-person examination) and has agreed to be treated using telemedicine. The patient's/patient's family's questions regarding telemedicine have been answered.   The patient and/or parent/guardian has also been advised to contact their provider's office for worsening conditions, and seek emergency medical treatment and/or call 911 if the patient deems either necessary.     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   I have reviewed current medications, nurse's notes, allergies, vital signs, past medical and surgical history, family medical history, and social history for this encounter. Counseled patient on symptoms, examination findings, lab findings, imaging results, treatment decisions and monitoring and prognosis. The patient understood the recommendations and agrees with the treatment plan. All questions regarding treatment plan were fully answered.  Sonia Birmingham, MD  05/05/2024   History of Present Illness HPI  Sonia [name] is a 38 y.o. 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  Reports having a ezcema flare last week; Eyes are red, flaking under eyes, could be fighting a cold Last Fri got infusion for crohn's   2003: NON-DISPLACED, INCOMPLETE FRACTURE, DORSAL RADIUS  Reports having broken left hand thrice, broke a toe after hit her bed, herniated risks  Had root canal on two teeth and several cavities  Reports her joints, back, shoulders, knees and ankle always hurt   Initial history:  Has crohn's colitis, with intermittent flare ups. On treatment for it. 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 Started  it due to cramps in legs and feet which ahs resovled with supplementation, and her restless leg feeling has improved  Not taking any Vit D at this time   Dad had pituitary tumor, sister has empty sella, cousin on paternal side also has pituitary tumor Can squeeze milk out of her breast  calcium, phosphorus, magnesium, alkaline phosphatase, creatinine, parathyroid hormone (PTH), 25(OH) vitamin D , and 1,25(OH)2 vitamin D . Levels of PLP, PPi in plasma, and PEA in urine determine the diagnosis. Assess the alkaline phosphatase and vitamin B6 levels. The former is consistently low and the latter consistently elevated. Do not use ethylenediaminetetraacetic acid (EDTA) tubes because these cause erroneous test results.     Physical Exam  Ht 5' 2 (1.575 m)   Wt 177 lb (80.3 kg)   BMI 32.37 kg/m    Constitutional: well developed, well nourished (very dry, cracking skin around nail beds in both hands, normal in toe nail beds) Head: normocephalic, atraumatic Eyes: sclera anicteric, no redness Neck: supple Lungs: normal respiratory effort Neurology: alert and oriented Skin: dry, no appreciable rashes Musculoskeletal: no appreciable defects Psychiatric: normal mood and affect   Current Medications Patient's Medications  New Prescriptions   No medications on file  Previous Medications   ALBUTEROL  (VENTOLIN  HFA) 108 (90 BASE) MCG/ACT INHALER    Inhale 1-2 puffs into the lungs every 4 (four) hours as needed for wheezing or shortness of breath.   AMPHETAMINE -DEXTROAMPHETAMINE  (ADDERALL) 10 MG TABLET    Take 1 tablet (10 mg total) by mouth daily after lunch.   BUDESONIDE-FORMOTEROL (SYMBICORT) 80-4.5 MCG/ACT INHALER    Inhale 2 puffs into the lungs 2 (two) times a day. As needed   CETIRIZINE  (ZYRTEC ) 10 MG TABLET    Take 10 mg by mouth daily.   CHLORHEXIDINE  (HIBICLENS ) 4 % EXTERNAL LIQUID    Apply to the affected area(s) topically daily as needed for up to 10 days.   DICYCLOMINE  (BENTYL ) 10 MG  CAPSULE    Take 1 capsule (10 mg total) by mouth every 6 (six) hours as needed (abdominal cramping/abdominal discomfort).   DIPHENHYDRAMINE  (BENADRYL  ALLERGY) 25 MG CAPSULE       ESCITALOPRAM  (LEXAPRO ) 10 MG TABLET    Take 1 tablet (10 mg total) by mouth daily.   FLUTICASONE (FLONASE ALLERGY RELIEF) 50 MCG/ACT NASAL SPRAY       IBUPROFEN  (ADVIL ) 800 MG TABLET    Take 800 mg by mouth 2 (two) times daily.   INFLIXIMAB  (REMICADE ) 100 MG INJECTION    Inject into the vein every 8 (eight) weeks.   LISDEXAMFETAMINE  (VYVANSE ) 30 MG CAPSULE    Take 1 capsule (30 mg total) by mouth daily.   LISDEXAMFETAMINE  (VYVANSE ) 30 MG CAPSULE    Take 1 capsule (30 mg total) by mouth daily.   LISDEXAMFETAMINE  (VYVANSE ) 30 MG CAPSULE    Take 1 capsule (30 mg total) by mouth daily.   MAGNESIUM PO    Take 250 mg by mouth at bedtime.   NAPHAZOLINE-PHENIRAMINE (NAPHCON-A) 0.025-0.3 % OPHTHALMIC SOLUTION    1 drop into affected eye  as needed Ophthalmic Four times a day   ONDANSETRON  (ZOFRAN ) 4 MG TABLET    Take 1 tablet (4 mg total) by mouth every 6 (six) hours.   OVER THE COUNTER MEDICATION    Beef organ supplement-once a day  PREDNISONE  (STERAPRED UNI-PAK 21 TAB) 10 MG (21) TBPK TABLET    Take 6 tablets by mouth on day 1, 5 tabs on day 2, 4 tabs on day 3, 3 tabs on day 4, 2 tabs on day 5, 1 tab on day 6. Then stop.   TRIAMCINOLONE  CREAM (KENALOG ) 0.1 %    Apply to the affected area(s) topically 2 (two) times daily.  Modified Medications   No medications on file  Discontinued Medications   No medications on file    Allergies Allergies  Allergen Reactions   Other Other (See Comments)    Mold, mildew, grass, fungus, trees cause swelling of eyes    Past Medical History Past Medical History:  Diagnosis Date   Allergy    Anemia    Anxiety    Asthma    Blood transfusion without reported diagnosis    iron infusion   COVID-19    Crohn's colitis (HCC)    Depression    GERD (gastroesophageal reflux disease)     no meds   Normal labor 03/27/2013   Postpartum care following cesarean delivery (11/9) 03/27/2013    Past Surgical History Past Surgical History:  Procedure Laterality Date   BIOPSY  05/24/2020   Procedure: BIOPSY;  Surgeon: Albertus Gordy HERO, MD;  Location: [facility];  Service: Gastroenterology;;   CERVICAL SPINE SURGERY     CESAREAN SECTION N/A 03/27/2013   Procedure: CESAREAN SECTION;  Surgeon: Charlie JINNY Flowers, MD;  Location: [facility];  Service: Obstetrics;  Laterality: N/A;   CESAREAN SECTION N/A 09/04/2014   Procedure: REPEAT CESAREAN SECTION;  Surgeon: Ovid All, MD;  Location: [facility];  Service: Obstetrics;  Laterality: N/A;   ESOPHAGOGASTRODUODENOSCOPY (EGD) WITH PROPOFOL  N/A 05/24/2020   Procedure: ESOPHAGOGASTRODUODENOSCOPY (EGD) WITH PROPOFOL ;  Surgeon: Albertus Gordy HERO, MD;  Location: [facility] END[facility]COPY;  Service: Gastroenterology;  Laterality: N/A;   HAND SURGERY  2012   with nerve block   LAPAROSCOPIC ENDOMETRIOSIS FULGURATION     WISDOM TOOTH EXTRACTION      Family History family history includes Alcohol abuse in her maternal grandfather; Anxiety disorder in her mother and sister; Arthritis-Osteo in her maternal grandmother; COPD in her mother; Cancer in her maternal grandfather; Crohn's disease in her cousin; Depression in her sister; Diverticulitis in her mother; Heart disease in her maternal grandmother and mother; Heart failure in her mother; Hernia in her mother; Kidney disease in her mother; Osteoporosis in her mother; Other in her father; Rheum arthritis in her mother; Stroke (age of onset: 40) in her mother.  Social History Social History   Socioeconomic History   Marital status: Married    Spouse name: Not on file   Number of children: 2   Years of education: Not on file   Highest education level: Associate degree: occupational, scientist, product/process development, or vocational program  Occupational History   Occupation: stay at home mother  Tobacco Use   Smoking status: Never    Smokeless tobacco: Former    Types: Engineer, Drilling   Vaping status: Never Used  Substance and Sexual Activity   Alcohol use: Yes    Comment: occasionally   Drug use: No   Sexual activity: Yes    Birth control/protection: None  Other Topics Concern   Not on file  Social History Narrative   Not on file   Social Drivers of Health   Tobacco Use: Medium Risk (04/18/2024)   Patient History    Smoking Tobacco Use: Never    Smokeless Tobacco Use:  Former    Passive Exposure: Not on Actuary Strain: Low Risk (03/04/2024)   Overall Financial Resource Strain (CARDIA)    Difficulty of Paying Living Expenses: Not very hard  Food Insecurity: No Food Insecurity (03/04/2024)   Epic    Worried About Programme Researcher, Broadcasting/film/video in the Last Year: Never true    Ran Out of Food in the Last Year: Never true  Transportation Needs: No Transportation Needs (03/04/2024)   Epic    Lack of Transportation (Medical): No    Lack of Transportation (Non-Medical): No  Physical Activity: Insufficiently Active (03/04/2024)   Exercise Vital Sign    Days of Exercise per Week: 3 days    Minutes of Exercise per Session: 40 min  Stress: No Stress Concern Present (03/04/2024)   [employer] of Occupational Health - Occupational Stress Questionnaire    Feeling of Stress: Only a little  Social Connections: Moderately Integrated (03/04/2024)   Social Connection and Isolation Panel    Frequency of Communication with Friends and Family: More than three times a week    Frequency of Social Gatherings with Friends and Family: Once a week    Attends Religious Services: 1 to 4 times per year    Active Member of Clubs or Organizations: No    Attends Engineer, Structural: Not on file    Marital Status: Married  Catering Manager Violence: Not on file  Depression (PHQ2-9): Low Risk (04/29/2024)   Depression (PHQ2-9)    PHQ-2 Score: 0  Recent Concern: Depression (PHQ2-9) - Medium Risk (03/04/2024)    Depression (PHQ2-9)    PHQ-2 Score: 9  Alcohol Screen: Not on file  Housing: High Risk (03/04/2024)   Epic    Unable to Pay for Housing in the Last Year: Yes    Number of Times Moved in the Last Year: 0    Homeless in the Last Year: No  Utilities: Not on file  Health Literacy: Not on file    Lab Results  Component Value Date   CHOL 190 11/27/2022   Lab Results  Component Value Date   HDL 55.00 11/27/2022   Lab Results  Component Value Date   LDLCALC 118 (H) 11/27/2022   Lab Results  Component Value Date   TRIG 83.0 11/27/2022   Lab Results  Component Value Date   CHOLHDL 3 11/27/2022   Lab Results  Component Value Date   CREATININE 0.75 04/18/2024   Lab Results  Component Value Date   GFR 92.66 07/31/2023      Component Value Date/Time   NA 139 04/18/2024 0947   K 4.3 04/18/2024 0947   CL 102 04/18/2024 0947   CO2 33 (H) 04/18/2024 0947   GLUCOSE 96 04/18/2024 0947   BUN 11 04/18/2024 0947   CREATININE 0.75 04/18/2024 0947   CALCIUM 9.6 04/18/2024 0947   PROT 7.3 01/06/2024 0909   ALBUMIN 4.0 01/06/2024 0909   AST 19 01/06/2024 0909   ALT 18 01/06/2024 0909   ALKPHOS 38 01/06/2024 0909   BILITOT 0.7 01/06/2024 0909   GFRNONAA >60 01/06/2024 0909   GFRAA >60 12/07/2019 1459      Latest Ref Rng & Units 04/18/2024    9:47 AM 01/06/2024    9:09 AM 07/31/2023   11:54 AM  BMP  Glucose 65 - 99 mg/dL 96  894  96   BUN 7 - 25 mg/dL 11  5  7    Creatinine 0.50 - 0.97 mg/dL 9.24  9.06  0.81   BUN/Creat Ratio 6 - 22 (calc) SEE NOTE:     Sodium 135 - 146 mmol/L 139  138  138   Potassium 3.5 - 5.3 mmol/L 4.3  4.3  4.2   Chloride 98 - 110 mmol/L 102  100  102   CO2 20 - 32 mmol/L 33  25  29   Calcium 8.6 - 10.2 mg/dL 9.6  9.9  9.5        Component Value Date/Time   WBC 5.8 01/06/2024 0909   RBC 4.46 01/06/2024 0909   HGB 13.1 01/06/2024 0909   HCT 39.3 01/06/2024 0909   PLT 376 01/06/2024 0909   MCV 88.1 01/06/2024 0909   MCH 29.4 01/06/2024 0909    MCHC 33.3 01/06/2024 0909   RDW 12.4 01/06/2024 0909   LYMPHSABS 1.1 12/22/2023 0934   MONOABS 0.4 12/22/2023 0934   EOSABS 0.4 12/22/2023 0934   BASOSABS 0.1 12/22/2023 0934   Lab Results  Component Value Date   TSH 0.97 04/29/2021   TSH 0.70 02/16/2019   TSH 0.72 01/19/2018   FREET4 0.71 04/29/2021         Parts of this note may have been dictated using voice recognition software. There may be variances in spelling and vocabulary which are unintentional. Not all errors are proofread. Please notify the dino if any discrepancies are noted or if the meaning of any statement is not clear.

## 2024-05-05 NOTE — Telephone Encounter (Signed)
-----   Message from Dartha Ernst, MD sent at 05/05/2024 12:27 PM EST ----- Please let the patient know that I have ordered DEXA scan for her, make sure she is not pregnant and that she has not had a DEXA scan in the past 2 years.  If she has had it, she does not need to do this 1 and she can just share her results with me.  Also, I have ordered the lab under [facility], can you please fax the orders to [facility] and let the patient know.  Thanks

## 2024-05-11 ENCOUNTER — Other Ambulatory Visit (HOSPITAL_BASED_OUTPATIENT_CLINIC_OR_DEPARTMENT_OTHER): Payer: Self-pay

## 2024-05-11 ENCOUNTER — Encounter: Payer: Self-pay | Admitting: Family Medicine

## 2024-05-11 ENCOUNTER — Ambulatory Visit (INDEPENDENT_AMBULATORY_CARE_PROVIDER_SITE_OTHER): Admitting: Family Medicine

## 2024-05-11 ENCOUNTER — Ambulatory Visit: Payer: Self-pay

## 2024-05-11 VITALS — BP 110/80 | HR 60 | Temp 97.9°F | Resp 16 | Ht 62.0 in | Wt 168.8 lb

## 2024-05-11 DIAGNOSIS — L209 Atopic dermatitis, unspecified: Secondary | ICD-10-CM | POA: Diagnosis not present

## 2024-05-11 DIAGNOSIS — L509 Urticaria, unspecified: Secondary | ICD-10-CM | POA: Diagnosis not present

## 2024-05-11 MED ORDER — METHYLPREDNISOLONE ACETATE 40 MG/ML IJ SUSP
40.0000 mg | Freq: Once | INTRAMUSCULAR | Status: AC
  Administered 2024-05-11: 40 mg via INTRAMUSCULAR

## 2024-05-11 MED ORDER — DEXAMETHASONE 4 MG PO TABS
4.0000 mg | ORAL_TABLET | Freq: Every day | ORAL | 0 refills | Status: AC
  Filled 2024-05-11: qty 5, 5d supply, fill #0

## 2024-05-11 MED ORDER — HYDROXYZINE HCL 25 MG PO TABS
25.0000 mg | ORAL_TABLET | Freq: Every evening | ORAL | 0 refills | Status: AC | PRN
  Filled 2024-05-11: qty 28, 14d supply, fill #0

## 2024-05-11 NOTE — Telephone Encounter (Signed)
 Noted- ok to close.

## 2024-05-11 NOTE — Progress Notes (Signed)
 "  ACUTE VISIT Chief Complaint  Patient presents with   Acute Visit    Patient has a rash that come and goes, she had it this morning.  Rash starts on left shoulder down her arm, goes to her chest and then right arm, rash itches and has been using benadryl  spray.   Discussed the use of AI scribe software for clinical note transcription with the patient, who gave verbal consent to proceed.  History of Present Illness Sonia [name] is a 38 year old female with past medical history significant for depression/anxiety, allergies, asthma, and Crohn's disease here today complaining of rash as described above.  She has a recurrent rash that began on Sunday, primarily affecting her arms,upper back, and shoulders. The rash is very pruritic, consists of small confluent bumps, and comes and goes within the same day. She has been using Benadryl  spray for relief.  Her history of eczema involves her fingers, chest, face, upper eye lids, legs, and feet. Problem is stable, having lesions intermittently. She uses topical Triamcinolone  and Benadryl .  She has Crohn's colitis and receives Remicade  infusions every eight weeks. She believes her immune system is compromised, contributing to her skin flares. She has used prednisone  in the past for skin issues, but it causes transient blurry vision. She mentions that she follows with endocrinology, having genetic testing.  No recent fever, chills, recent upper respiratory infections, or new exposures to medications, soaps, or detergents.  No associated oral lesions, stridor, cough, or wheezing. She has a history of environmental allergies and previously received allergy shots, which she discontinued due to job constraints but seemed like they help with eczema.  Her current medications include Benadryl  and famotidine  20 mg nightly. She has not taken prednisone  in the last few months.  Review of Systems  Constitutional:  Negative for activity change,  appetite change, chills and fever.  HENT:  Negative for congestion, sore throat and trouble swallowing.   Cardiovascular:  Negative for chest pain and leg swelling.  Gastrointestinal:  Negative for abdominal pain, nausea and vomiting.  Skin:  Negative for wound.  Allergic/Immunologic: Positive for environmental allergies.  Neurological:  Negative for weakness and numbness.  See other pertinent positives and negatives in HPI.  Medications Ordered Prior to Encounter[1]  Past Medical History:  Diagnosis Date   Allergy    Anemia    Anxiety    Asthma    Blood transfusion without reported diagnosis    iron infusion   COVID-19    Crohn's colitis (HCC)    Depression    GERD (gastroesophageal reflux disease)    no meds   Normal labor 03/27/2013   Postpartum care following cesarean delivery (11/9) 03/27/2013   Allergies[2]  Social History   Socioeconomic History   Marital status: Married    Spouse name: Not on file   Number of children: 2   Years of education: Not on file   Highest education level: Associate degree: occupational, scientist, product/process development, or vocational program  Occupational History   Occupation: stay at home mother  Tobacco Use   Smoking status: Never   Smokeless tobacco: Former    Types: Engineer, Drilling   Vaping status: Never Used  Substance and Sexual Activity   Alcohol use: Yes    Comment: occasionally   Drug use: No   Sexual activity: Yes    Birth control/protection: None  Other Topics Concern   Not on file  Social History Narrative   Not on file  Social Drivers of Health   Tobacco Use: Medium Risk (05/11/2024)   Patient History    Smoking Tobacco Use: Never    Smokeless Tobacco Use: Former    Passive Exposure: Not on Actuary Strain: Low Risk (03/04/2024)   Overall Financial Resource Strain (CARDIA)    Difficulty of Paying Living Expenses: Not very hard  Food Insecurity: No Food Insecurity (03/04/2024)   Epic    Worried About Community Education Officer in the Last Year: Never true    Ran Out of Food in the Last Year: Never true  Transportation Needs: No Transportation Needs (03/04/2024)   Epic    Lack of Transportation (Medical): No    Lack of Transportation (Non-Medical): No  Physical Activity: Insufficiently Active (03/04/2024)   Exercise Vital Sign    Days of Exercise per Week: 3 days    Minutes of Exercise per Session: 40 min  Stress: No Stress Concern Present (03/04/2024)   [employer] of Occupational Health - Occupational Stress Questionnaire    Feeling of Stress: Only a little  Social Connections: Moderately Integrated (03/04/2024)   Social Connection and Isolation Panel    Frequency of Communication with Friends and Family: More than three times a week    Frequency of Social Gatherings with Friends and Family: Once a week    Attends Religious Services: 1 to 4 times per year    Active Member of Clubs or Organizations: No    Attends Banker Meetings: Not on file    Marital Status: Married  Depression (PHQ2-9): Low Risk (04/29/2024)   Depression (PHQ2-9)    PHQ-2 Score: 0  Recent Concern: Depression (PHQ2-9) - Medium Risk (03/04/2024)   Depression (PHQ2-9)    PHQ-2 Score: 9  Alcohol Screen: Not on file  Housing: High Risk (03/04/2024)   Epic    Unable to Pay for Housing in the Last Year: Yes    Number of Times Moved in the Last Year: 0    Homeless in the Last Year: No  Utilities: Not on file  Health Literacy: Not on file   Vitals:   05/11/24 0906  BP: 110/80  Pulse: 60  Resp: 16  Temp: 97.9 F (36.6 C)  SpO2: 99%   Body mass index is 30.87 kg/m.  Physical Exam Vitals and nursing note reviewed.  Constitutional:      General: She is not in acute distress.    Appearance: She is well-developed.  HENT:     Head: Normocephalic and atraumatic.     Mouth/Throat:     Mouth: Mucous membranes are moist.     Pharynx: Oropharynx is clear.  Eyes:     Conjunctiva/sclera: Conjunctivae  normal.  Cardiovascular:     Rate and Rhythm: Normal rate and regular rhythm.     Heart sounds: No murmur heard. Pulmonary:     Effort: Pulmonary effort is normal. No respiratory distress.     Breath sounds: Normal breath sounds.  Lymphadenopathy:     Cervical: No cervical adenopathy.  Skin:    General: Skin is warm.     Findings: Rash present. Rash is not vesicular.     Comments: Mildly erythematous scaly rash on upper chest.  Neurological:     Mental Status: She is alert and oriented to person, place, and time.  Psychiatric:        Mood and Affect: Mood and affect normal.   ASSESSMENT AND PLAN:  [name] was seen today for new  skin rash.  Diagnoses and all orders for this visit:  Atopic dermatitis, unspecified type Chronic. Current episode could be related with this problem. Currently on triamcinolone  cream. Continue follow-up with PCP.  -     hydrOXYzine  (ATARAX ) 25 MG tablet; Take 1-2 tablets (25-50 mg total) by mouth at bedtime as needed for up to 14 days for itching. -     dexamethasone  (DECADRON ) 4 MG tablet; Take 1 tablet (4 mg total) by mouth daily for 5 days.  Urticaria Acute rash for which she was seen today is not present at this time, she apply topical Benadryl  this morning, which seems to be effective. Hx suggest urticaria but pictures she got are not the typical lesions. We discussed treatment options, she agrees with Depo-Medrol  40 mg IM x 1 here in the office, which was given after verbal consent. Dexamethasone  4 mg to start if rash becomes recurrent. Hydroxyzine  25 to 50 mg at bedtime for up to 14 days. She is already on Pepcid  20 mg at bedtime, recommend increasing dose to twice daily for up to 2 weeks. She will take 10 mg in the morning. We discussed side effects of medications. Instructed to follow-up with her PCP if problem is requiring, she may need further workup or immunology referral.  -     hydrOXYzine  (ATARAX ) 25 MG tablet; Take 1-2  tablets (25-50 mg total) by mouth at bedtime as needed for up to 14 days for itching. -     dexamethasone  (DECADRON ) 4 MG tablet; Take 1 tablet (4 mg total) by mouth daily for 5 days.  Return if symptoms worsen or fail to improve.  Emmely Bittinger G. Cari Vandeberg, MD  [facility]. [place] office.     [1]  Current Outpatient Medications on File Prior to Visit  Medication Sig Dispense Refill   albuterol  (VENTOLIN  HFA) 108 (90 Base) MCG/ACT inhaler Inhale 1-2 puffs into the lungs every 4 (four) hours as needed for wheezing or shortness of breath. 18 g 0   amphetamine -dextroamphetamine  (ADDERALL) 10 MG tablet Take 1 tablet (10 mg total) by mouth daily after lunch. 30 tablet 0   budesonide-formoterol (SYMBICORT) 80-4.5 MCG/ACT inhaler Inhale 2 puffs into the lungs 2 (two) times a day. As needed     cetirizine  (ZYRTEC ) 10 MG tablet Take 10 mg by mouth daily.     chlorhexidine  (HIBICLENS ) 4 % external liquid Apply to the affected area(s) topically daily as needed for up to 10 days. 946 mL 0   dicyclomine  (BENTYL ) 10 MG capsule Take 1 capsule (10 mg total) by mouth every 6 (six) hours as needed (abdominal cramping/abdominal discomfort). 60 capsule 3   diphenhydrAMINE  (BENADRYL  ALLERGY) 25 mg capsule      escitalopram  (LEXAPRO ) 10 MG tablet Take 1 tablet (10 mg total) by mouth daily. 90 tablet 1   fluticasone (FLONASE ALLERGY RELIEF) 50 MCG/ACT nasal spray      ibuprofen  (ADVIL ) 800 MG tablet Take 800 mg by mouth 2 (two) times daily.     inFLIXimab  (REMICADE ) 100 MG injection Inject into the vein every 8 (eight) weeks.     lisdexamfetamine  (VYVANSE ) 30 MG capsule Take 1 capsule (30 mg total) by mouth daily. 30 capsule 0   MAGNESIUM PO Take 250 mg by mouth at bedtime.     naphazoline-pheniramine (NAPHCON-A) 0.025-0.3 % ophthalmic solution 1 drop into affected eye  as needed Ophthalmic Four times a day     ondansetron  (ZOFRAN ) 4 MG tablet Take 1 tablet (4 mg total) by mouth  every 6 (six) hours. 12  tablet 0   OVER THE COUNTER MEDICATION Beef organ supplement-once a day     triamcinolone  cream (KENALOG ) 0.1 % Apply to the affected area(s) topically 2 (two) times daily. 45 g 0   [START ON 05/19/2024] lisdexamfetamine  (VYVANSE ) 30 MG capsule Take 1 capsule (30 mg total) by mouth daily. (Patient not taking: Reported on 05/11/2024) 30 capsule 0   lisdexamfetamine  (VYVANSE ) 30 MG capsule Take 1 capsule (30 mg total) by mouth daily. (Patient not taking: Reported on 05/11/2024) 30 capsule 0   [DISCONTINUED] Norgestimate-Eth Estradiol  (SPRINTEC 28 PO) Take by mouth.     No current facility-administered medications on file prior to visit.  [2]  Allergies Allergen Reactions   Other Other (See Comments)    Mold, mildew, grass, fungus, trees cause swelling of eyes   "

## 2024-05-11 NOTE — Telephone Encounter (Signed)
 Appt today at 9am with Dr Jordan.

## 2024-05-11 NOTE — Telephone Encounter (Signed)
 FYI Only or Action Required?: FYI only for provider: appointment scheduled on 12/24.  Patient was last seen in primary care on 03/04/2024 by Ozell Heron HERO, MD.  Called Nurse Triage reporting Rash.  Symptoms began several days ago.  Interventions attempted: OTC medications: benadryl .  Symptoms are: gradually worsening.  Triage Disposition: See Physician Within 24 Hours  Patient/caregiver understands and will follow disposition?: Yes     Copied from CRM #[id number]. Topic: Clinical - Red Word Triage >> May 11, 2024  8:24 AM Rea BROCKS wrote: Red Word that prompted transfer to Nurse Triage: Spreading rash arms, shoulders, chest, itchy, worsening Reason for Disposition  [1] MODERATE-SEVERE hives (e.g.,hives interfere with normal activities or work) AND [2] not improved after taking antihistamine (e.g., cetirizine , fexofenadine, or loratadine ) > 24 hours  Answer Assessment - Initial Assessment Questions This RN advised pt be examined asap within 24 hours, scheduled appt with PCP office this morning, advised go to ED with any worsening symptoms. Pt verbalized understanding.   Doesn't present like eczema Both shoulders and arms Almost looks like, not hives not raised bumps, like almost sunburned, bumps in it, more blanketed than individualized Itchy 10/10 don't sleep wake up itching Came up suddenly 3 days ago No SOB, swelling to tongue, fever, hoarseness, cough, clammy skin, heart racing No exposures to new things Think histamine reaction, really bad immune system Fingers have really bad eczema and cracked open, very painful, steroid cream not helping, usually need oral steroid to help, don't look infected Medium bright red, bright spread at shoulder Arms not as blanketed but little tiny bumps on arm Benadryl  goes away but don't want to take every single day  Protocols used: Hives-A-AH

## 2024-05-11 NOTE — Patient Instructions (Addendum)
 A few things to remember from today's visit:  Atopic dermatitis, unspecified type - Plan: hydrOXYzine  (ATARAX ) 25 MG tablet, dexamethasone  (DECADRON ) 4 MG tablet  Urticaria - Plan: hydrOXYzine  (ATARAX ) 25 MG tablet, dexamethasone  (DECADRON ) 4 MG tablet  Here in the office you received a shot of Depo-Medrol  40 mg. If rash is recurrent tomorrow, you can start dexamethasone  1 tablet daily for up to 5 days. Increase Pepcid  from once daily at night to twice daily for 2 weeks. Take Zyrtec  10 mg in the morning and hydroxyzine  25 to 50 mg at bedtime. Monitor for new symptoms. If problem becomes recurrent, please arrange appointment with Dr. Ozell.  Do not use My Chart to request refills or for acute issues that need immediate attention.  Please be sure medication list is accurate. If a new problem present, please set up appointment sooner than planned today.

## 2024-05-26 ENCOUNTER — Other Ambulatory Visit: Payer: Self-pay | Admitting: Family Medicine

## 2024-05-26 DIAGNOSIS — F419 Anxiety disorder, unspecified: Secondary | ICD-10-CM

## 2024-06-01 ENCOUNTER — Encounter: Payer: Self-pay | Admitting: "Endocrinology

## 2024-06-03 ENCOUNTER — Other Ambulatory Visit: Payer: Self-pay

## 2024-06-03 ENCOUNTER — Other Ambulatory Visit: Payer: Self-pay | Admitting: Family Medicine

## 2024-06-03 ENCOUNTER — Other Ambulatory Visit (HOSPITAL_BASED_OUTPATIENT_CLINIC_OR_DEPARTMENT_OTHER): Payer: Self-pay

## 2024-06-03 DIAGNOSIS — F50819 Binge eating disorder, unspecified: Secondary | ICD-10-CM

## 2024-06-06 ENCOUNTER — Other Ambulatory Visit (HOSPITAL_BASED_OUTPATIENT_CLINIC_OR_DEPARTMENT_OTHER): Payer: Self-pay

## 2024-06-06 MED ORDER — AMPHETAMINE-DEXTROAMPHETAMINE 10 MG PO TABS
10.0000 mg | ORAL_TABLET | Freq: Every day | ORAL | 0 refills | Status: AC
  Filled 2024-06-06: qty 30, 30d supply, fill #0

## 2024-06-10 ENCOUNTER — Encounter: Payer: Self-pay | Admitting: Family Medicine

## 2024-06-10 ENCOUNTER — Ambulatory Visit: Admitting: Family Medicine

## 2024-06-10 VITALS — BP 98/60 | HR 63 | Temp 98.4°F | Ht 62.0 in | Wt 164.1 lb

## 2024-06-10 DIAGNOSIS — L308 Other specified dermatitis: Secondary | ICD-10-CM

## 2024-06-10 MED ORDER — ZORYVE 0.15 % EX CREA: 1.0000 | TOPICAL_CREAM | Freq: Every day | CUTANEOUS | 2 refills | Status: AC

## 2024-06-10 NOTE — Progress Notes (Signed)
 "  Established Patient Office Visit  Subjective   Patient ID: Sonia [name], female    DOB: 1985-09-13  Age: 39 y.o. MRN: [id number]  Chief Complaint  Patient presents with   Eczema    Patient complains of recurrent eczema flare-up on the face, itching entire body, and eyelids x1.5 weeks, states she was seen by Dr Jordan previously and given an injection with relief, also requests refill on Hydroxyzine      HPI  Discussed the use of AI scribe software for clinical note transcription with the patient, who gave verbal consent to proceed.  History of Present Illness   Sonia [name] is a 39 year old female with eczema who presents with an exacerbation of eczema.  She describes a severe flare of her eczema involving hands, breasts, chest, eyelids, and lips, with diffuse burning and itching for over a year and worsening in the past week. Fingers are cracked and bleeding, and eyelids are swollen. She has had episodes of nail peeling.  She has Crohns disease managed with Remicade , which controls her Crohns but not her skin. A steroid injection and oral steroid course a month ago gave temporary relief and allowed her fingers to heal for the first time in over a year, but symptoms recurred after completion.  She is using antibiotic ointment, triamcinolone , and hydroxyzine  for itching. Hydroxyzine  reduces itching but causes marked drowsiness. Daily Zyrtec  and Pepcid  do not improve her skin symptoms.  Her twin sister has hypophosphatasia and her mother is suspected to have had it. She is being evaluated by endocrinology for possible hypophosphatasia.  She denies dry eyes but describes them as inflamed and burning, with intermittent blurry vision that worsens when her skin symptoms flare.      Current Outpatient Medications  Medication Instructions   albuterol  (VENTOLIN  HFA) 108 (90 Base) MCG/ACT inhaler 1-2 puffs, Inhalation, Every 4 hours PRN    amphetamine -dextroamphetamine  (ADDERALL) 10 MG tablet 10 mg, Oral, Daily after lunch   budesonide-formoterol (SYMBICORT) 80-4.5 MCG/ACT inhaler 2 puffs, 2 times daily   cetirizine  (ZYRTEC ) 10 mg, Daily   dicyclomine  (BENTYL ) 10 mg, Oral, Every 6 hours PRN   diphenhydrAMINE  (BENADRYL  ALLERGY) 25 mg capsule    escitalopram  (LEXAPRO ) 10 MG tablet TAKE 1 TABLET(10 MG) BY MOUTH DAILY   fluticasone (FLONASE ALLERGY RELIEF) 50 MCG/ACT nasal spray    ibuprofen  (ADVIL ) 800 mg, 2 times daily   inFLIXimab  (REMICADE ) 100 MG injection Every 8 weeks   lisdexamfetamine  (VYVANSE ) 30 mg, Oral, Daily   lisdexamfetamine  (VYVANSE ) 30 mg, Oral, Daily   lisdexamfetamine  (VYVANSE ) 30 mg, Oral, Daily   MAGNESIUM PO 250 mg, Daily at bedtime   naphazoline-pheniramine (NAPHCON-A) 0.025-0.3 % ophthalmic solution 1 drop into affected eye  as needed Ophthalmic Four times a day   ondansetron  (ZOFRAN ) 4 mg, Oral, Every 6 hours   OVER THE COUNTER MEDICATION Beef organ supplement-once a day   Roflumilast  (ZORYVE ) 0.15 % CREA 1 Application, Apply externally, Daily   triamcinolone  cream (KENALOG ) 0.1 % Apply to the affected area(s) topically 2 (two) times daily.    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     Objective:     BP 98/60   Pulse 63   Temp 98.4 F (36.9 C) (Oral)   Ht 5' 2 (1.575 m)   Wt 164 lb 1.6 oz (74.4 kg)   LMP 06/08/2024 (Exact Date)   SpO2 100%   BMI 30.01 kg/m    Physical Exam Vitals reviewed.  Constitutional:      Appearance: Normal appearance. She is normal weight.  Pulmonary:     Effort: Pulmonary effort is normal.  Skin:    Findings: Rash present.  Neurological:     Mental Status: She is alert and oriented to person, place, and time. Mental status is at baseline.  Psychiatric:        Mood and Affect: Mood normal.        Behavior: Behavior normal.      No results found for any visits on 06/10/24.    The ASCVD Risk score (Arnett DK, et al., 2019) failed to calculate for the following reasons:   The 2019 ASCVD risk score is only valid for ages 57 to 51    Assessment & Plan:  Other eczema -     Zoryve ; Apply 1 Application topically daily.  Dispense: 60 g; Refill: 2 -     ANA w/Reflex; Future -     Anti-smooth muscle antibody, IgG; Future -     Anti-DNA antibody, double-stranded; Future -     Sjogren's syndrome antibods(ssa + ssb); Future   Assessment and Plan    Chronic eczema with widespread flare Chronic eczema with a severe flare affecting fingers, chest, eyes, and lips, characterized by cracked, bleeding fingers, swollen eyelids, burning sensation, and intense itching. Previous steroid treatment provided temporary relief. Current treatment with Remicade  is ineffective. Considering non-steroidal topical treatment due to chronicity and steroid failure. - Prescribed Zoryve  cream for daily use, pending prior authorization and specialty pharmacy processing. - Provided sample of Zoryve   cream for immediate trial. - Continue hydroxyzine  for itching, with refills provided. - Continue cetirizine  and Pepcid  for mast cell stabilization.  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 Previous labs do not include comprehensive rheumatologic panel. Consideration of referral to rheumatologist if labs are positive, or dermatologist if negative. - Ordered ANA and comprehensive rheumatologic panel including anti-Smith, anti-Rho, anti-LA, and single-stranded A. - If rheumatologic labs are positive, will refer to rheumatologist. - If rheumatologic labs are negative, will refer to dermatologist.  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 Endocrinology is involved in management. - Recommended DEXA scan to assess bone density.        No follow-ups on file.    Heron CHRISTELLA Sharper, MD "

## 2024-06-15 ENCOUNTER — Other Ambulatory Visit

## 2024-06-16 ENCOUNTER — Other Ambulatory Visit (INDEPENDENT_AMBULATORY_CARE_PROVIDER_SITE_OTHER)

## 2024-06-16 DIAGNOSIS — L308 Other specified dermatitis: Secondary | ICD-10-CM

## 2024-06-17 LAB — ENA+DNA/DS+SJORGEN'S
ENA RNP Ab: 2.3 AI — ABNORMAL HIGH (ref 0.0–0.9)
ENA SM Ab Ser-aCnc: <0.2 AI (ref 0.0–0.9)
ENA SSA (RO) Ab: <0.2 AI (ref 0.0–0.9)
ENA SSB (LA) Ab: <0.2 AI (ref 0.0–0.9)
dsDNA Ab: <1 [IU]/mL (ref 0–9)

## 2024-06-17 LAB — ANA W/REFLEX: Anti Nuclear Antibody (ANA): POSITIVE — AB

## 2024-06-21 ENCOUNTER — Other Ambulatory Visit (HOSPITAL_BASED_OUTPATIENT_CLINIC_OR_DEPARTMENT_OTHER): Payer: Self-pay

## 2024-06-21 ENCOUNTER — Encounter: Payer: Self-pay | Admitting: Family Medicine

## 2024-06-21 DIAGNOSIS — L308 Other specified dermatitis: Secondary | ICD-10-CM

## 2024-06-21 DIAGNOSIS — M3313 Other dermatomyositis without myopathy: Secondary | ICD-10-CM

## 2024-06-21 LAB — ANTI-DNA ANTIBODY, DOUBLE-STRANDED: ds DNA Ab: 1 [IU]/mL

## 2024-06-21 LAB — SJOGREN'S SYNDROME ANTIBODS(SSA + SSB)
SSA (Ro) (ENA) Antibody, IgG: <1 AI
SSB (La) (ENA) Antibody, IgG: <1 AI

## 2024-06-21 LAB — ANTI-SMOOTH MUSCLE ANTIBODY, IGG: Actin (Smooth Muscle) Antibody (IGG): <20 U

## 2024-06-21 MED ORDER — HYDROXYZINE PAMOATE 25 MG PO CAPS
25.0000 mg | ORAL_CAPSULE | Freq: Three times a day (TID) | ORAL | 3 refills | Status: AC | PRN
  Filled 2024-06-21: qty 30, 10d supply, fill #0

## 2024-06-21 MED ORDER — METHYLPREDNISOLONE 4 MG PO TBPK
ORAL_TABLET | ORAL | 0 refills | Status: AC
  Filled 2024-06-21: qty 21, 6d supply, fill #0

## 2024-06-24 ENCOUNTER — Encounter: Attending: Gastroenterology | Admitting: *Deleted

## 2024-06-24 VITALS — BP 118/78 | HR 48 | Temp 98.0°F | Resp 16

## 2024-06-24 DIAGNOSIS — K5010 Crohn's disease of large intestine without complications: Secondary | ICD-10-CM

## 2024-06-24 MED ORDER — ACETAMINOPHEN 325 MG PO TABS
650.0000 mg | ORAL_TABLET | Freq: Once | ORAL | Status: AC
  Administered 2024-06-24: 650 mg via ORAL

## 2024-06-24 MED ORDER — DIPHENHYDRAMINE HCL 25 MG PO CAPS
25.0000 mg | ORAL_CAPSULE | Freq: Once | ORAL | Status: AC
  Administered 2024-06-24: 25 mg via ORAL

## 2024-06-24 MED ORDER — METHYLPREDNISOLONE SODIUM SUCC 40 MG IJ SOLR
40.0000 mg | Freq: Once | INTRAMUSCULAR | Status: AC
  Administered 2024-06-24: 40 mg via INTRAVENOUS

## 2024-06-24 MED ORDER — SODIUM CHLORIDE 0.9 % IV SOLN
5.0000 mg/kg | Freq: Once | INTRAVENOUS | Status: AC
  Administered 2024-06-24: 400 mg via INTRAVENOUS
  Filled 2024-06-24: qty 40

## 2024-06-24 NOTE — Progress Notes (Cosign Needed)
 Diagnosis: Crohn's Disease  Provider:  Sandor GAILS. Cirigliano, DO  Procedure: IV Infusion  IV Type: Peripheral, IV Location: R Antecubital  Avsola  (infliximab -axxq), Dose: 400 mg  Infusion Start Time: 1145  Infusion Stop Time: 1250  Post Infusion IV Care: Peripheral IV Discontinued  Discharge: Condition: Good, Destination: Home . AVS Declined  Performed by:  Baldwin Darice Helling, RN

## 2024-07-12 ENCOUNTER — Ambulatory Visit: Admitting: Family Medicine

## 2024-07-12 ENCOUNTER — Encounter: Admitting: Family Medicine

## 2024-07-20 ENCOUNTER — Other Ambulatory Visit

## 2024-08-19 ENCOUNTER — Ambulatory Visit

## 2024-09-05 ENCOUNTER — Encounter: Attending: Gastroenterology | Admitting: Internal Medicine
